# Patient Record
Sex: Male | Born: 1955
Health system: Southern US, Community
[De-identification: ages and names within clinical notes are randomized; demographics above are authoritative.]

## PROBLEM LIST (undated history)

## (undated) DIAGNOSIS — J45909 Unspecified asthma, uncomplicated: Secondary | ICD-10-CM

## (undated) DIAGNOSIS — I82409 Acute embolism and thrombosis of unspecified deep veins of unspecified lower extremity: Secondary | ICD-10-CM

## (undated) DIAGNOSIS — I2510 Atherosclerotic heart disease of native coronary artery without angina pectoris: Secondary | ICD-10-CM

## (undated) DIAGNOSIS — M109 Gout, unspecified: Secondary | ICD-10-CM

## (undated) DIAGNOSIS — M5136 Other intervertebral disc degeneration, lumbar region: Secondary | ICD-10-CM

## (undated) DIAGNOSIS — M792 Neuralgia and neuritis, unspecified: Secondary | ICD-10-CM

## (undated) DIAGNOSIS — E785 Hyperlipidemia, unspecified: Secondary | ICD-10-CM

## (undated) DIAGNOSIS — D72829 Elevated white blood cell count, unspecified: Secondary | ICD-10-CM

## (undated) DIAGNOSIS — J449 Chronic obstructive pulmonary disease, unspecified: Secondary | ICD-10-CM

## (undated) DIAGNOSIS — M51369 Other intervertebral disc degeneration, lumbar region without mention of lumbar back pain or lower extremity pain: Secondary | ICD-10-CM

## (undated) DIAGNOSIS — I2699 Other pulmonary embolism without acute cor pulmonale: Secondary | ICD-10-CM

## (undated) DIAGNOSIS — M79673 Pain in unspecified foot: Secondary | ICD-10-CM

## (undated) DIAGNOSIS — I10 Essential (primary) hypertension: Secondary | ICD-10-CM

## (undated) DIAGNOSIS — M549 Dorsalgia, unspecified: Secondary | ICD-10-CM

## (undated) DIAGNOSIS — E7800 Pure hypercholesterolemia, unspecified: Secondary | ICD-10-CM

## (undated) DIAGNOSIS — Z86718 Personal history of other venous thrombosis and embolism: Secondary | ICD-10-CM

## (undated) DIAGNOSIS — G629 Polyneuropathy, unspecified: Secondary | ICD-10-CM

## (undated) DIAGNOSIS — G8929 Other chronic pain: Secondary | ICD-10-CM

## (undated) HISTORY — DX: Hyperlipidemia, unspecified: E78.5

## (undated) HISTORY — PX: HERNIA REPAIR: SHX51

## (undated) HISTORY — DX: Other pulmonary embolism without acute cor pulmonale: I26.99

## (undated) HISTORY — DX: Other intervertebral disc degeneration, lumbar region without mention of lumbar back pain or lower extremity pain: M51.369

## (undated) HISTORY — DX: Personal history of other venous thrombosis and embolism: Z86.718

## (undated) HISTORY — DX: Other intervertebral disc degeneration, lumbar region: M51.36

## (undated) HISTORY — DX: Atherosclerotic heart disease of native coronary artery without angina pectoris: I25.10

## (undated) HISTORY — DX: Essential (primary) hypertension: I10

## (undated) HISTORY — PX: OTHER SURGICAL HISTORY: SHX169

## (undated) HISTORY — DX: Polyneuropathy, unspecified: G62.9

## (undated) HISTORY — PX: KNEE ARTHROSCOPY: SUR90

---

## 2004-03-23 ENCOUNTER — Ambulatory Visit: Payer: Self-pay | Admitting: Occupational Therapy

## 2007-04-22 ENCOUNTER — Emergency Department (HOSPITAL_COMMUNITY): Admission: EM | Admit: 2007-04-22 | Discharge: 2007-04-22 | Payer: Self-pay | Admitting: Emergency Medicine

## 2009-06-10 ENCOUNTER — Emergency Department (HOSPITAL_COMMUNITY): Admission: EM | Admit: 2009-06-10 | Discharge: 2009-06-10 | Payer: Self-pay | Admitting: Emergency Medicine

## 2010-12-24 ENCOUNTER — Ambulatory Visit: Payer: BC Managed Care – PPO | Attending: Nurse Practitioner | Admitting: Sleep Medicine

## 2010-12-24 DIAGNOSIS — G4733 Obstructive sleep apnea (adult) (pediatric): Secondary | ICD-10-CM | POA: Insufficient documentation

## 2010-12-24 DIAGNOSIS — I10 Essential (primary) hypertension: Secondary | ICD-10-CM

## 2011-01-02 NOTE — Procedures (Signed)
NAME:  Antonio Baker, Antonio Baker                  ACCOUNT NO.:  [id number]  MEDICAL RECORD NO.:  [id number]          PATIENT TYPE:  OUT  LOCATION:  SLEEP LAB                     FACILITY:  [facility]  PHYSICIAN:  Laurianne Floresca A. Gerilyn Pilgrim, M.D. DATE OF BIRTH:  1955-10-05  DATE OF STUDY:  12/24/2010                           NOCTURNAL POLYSOMNOGRAM  REFERRING PHYSICIAN:  Olegario Messier PATTERSON  REFERRING PHYSICIAN:  Ninfa Linden.  INDICATIONS:  A 55 year old presents with restless sleep, snoring, and fatigue.  The study has been done to evaluate for obstructive sleep apnea syndrome.  MEDICATIONS:  Amlodipine, losartan, simvastatin, and Uloric acid.  EPWORTH SLEEPINESS SCALE:  2.  BMI 28.  ARCHITECTURAL SUMMARY:  The total recording time is 423 minutes.  Sleep efficiency 60%, sleep latency 26 minutes.  REM latency 36 minutes. Stage N1 20.6%, N2 57.1%, N3 0%, and REM sleep 22.2%.  RESPIRATORY SUMMARY:  Baseline oxygen saturation is 97, lowest saturation 88.  DIAGNOSTICS:  AHI 6 and RDI 8.8.  LIMB MOVEMENT SUMMARY:  PLM index is 63.  ELECTROCARDIOGRAM SUMMARY:  Average heart rate is 71 with no significant dysrhythmias observed.  IMPRESSION: 1. Severe periodic limb movement disorder sleep. 2. Abnormal sleep architecture with early REM latency typically seen     in narcolepsy or REM rebound phenomenon such as withdrawal from     stimulants or antidepressants. 3. Mild obstructive sleep apnea syndrome not requiring positive     pressure.  RECOMMENDATIONS: 1. Consider sleep consultation for further evaluation of the early REM     latency. 2. Treatment of periodic limb movement disorder of sleep with dopamine     agonist such as Mirapex or ReQuip.  Thanks for this consultation.     Aixa Corsello A. Gerilyn Pilgrim, M.D.    KAD/MEDQ  D:  01/02/2011 17:07:43  T:  01/02/2011 82:95:62  Job:  [id number]

## 2011-03-02 LAB — BASIC METABOLIC PANEL
BUN: 11
Calcium: 9.9
Chloride: 102
GFR calc Af Amer: >60
Potassium: 4.7

## 2012-05-04 ENCOUNTER — Emergency Department (HOSPITAL_COMMUNITY)
Admission: EM | Admit: 2012-05-04 | Discharge: 2012-05-04 | Disposition: A | Discharge status: Home / Self Care | Payer: BC Managed Care – PPO | Attending: Emergency Medicine | Admitting: Emergency Medicine

## 2012-05-04 ENCOUNTER — Encounter (HOSPITAL_COMMUNITY): Payer: Self-pay | Admitting: Emergency Medicine

## 2012-05-04 ENCOUNTER — Emergency Department (HOSPITAL_COMMUNITY): Payer: BC Managed Care – PPO

## 2012-05-04 DIAGNOSIS — I10 Essential (primary) hypertension: Secondary | ICD-10-CM | POA: Insufficient documentation

## 2012-05-04 DIAGNOSIS — Z79899 Other long term (current) drug therapy: Secondary | ICD-10-CM | POA: Insufficient documentation

## 2012-05-04 DIAGNOSIS — J45901 Unspecified asthma with (acute) exacerbation: Secondary | ICD-10-CM | POA: Insufficient documentation

## 2012-05-04 DIAGNOSIS — Z8739 Personal history of other diseases of the musculoskeletal system and connective tissue: Secondary | ICD-10-CM | POA: Insufficient documentation

## 2012-05-04 DIAGNOSIS — R6883 Chills (without fever): Secondary | ICD-10-CM | POA: Insufficient documentation

## 2012-05-04 DIAGNOSIS — Z87891 Personal history of nicotine dependence: Secondary | ICD-10-CM | POA: Insufficient documentation

## 2012-05-04 DIAGNOSIS — R0602 Shortness of breath: Secondary | ICD-10-CM | POA: Insufficient documentation

## 2012-05-04 DIAGNOSIS — E7800 Pure hypercholesterolemia, unspecified: Secondary | ICD-10-CM | POA: Insufficient documentation

## 2012-05-04 HISTORY — DX: Pure hypercholesterolemia, unspecified: E78.00

## 2012-05-04 HISTORY — DX: Essential (primary) hypertension: I10

## 2012-05-04 HISTORY — DX: Gout, unspecified: M10.9

## 2012-05-04 LAB — CBC
HCT: 40 % (ref 39.0–52.0)
MCHC: 32.8 g/dL (ref 30.0–36.0)
MCV: 92.4 fL (ref 78.0–100.0)
RBC: 4.33 MIL/uL (ref 4.22–5.81)

## 2012-05-04 LAB — BASIC METABOLIC PANEL
Chloride: 104 mEq/L (ref 96–112)
Creatinine, Ser: 0.75 mg/dL (ref 0.50–1.35)
GFR calc Af Amer: >90 mL/min (ref >90)
Sodium: 139 mEq/L (ref 135–145)

## 2012-05-04 MED ORDER — PREDNISONE 10 MG PO TABS: 60.0000 mg | ORAL_TABLET | Freq: Every day | ORAL | Status: DC

## 2012-05-04 MED ORDER — PREDNISONE 50 MG PO TABS
60.0000 mg | ORAL_TABLET | Freq: Once | ORAL | Status: AC
  Administered 2012-05-04: 60 mg via ORAL
  Filled 2012-05-04: qty 1

## 2012-05-04 MED ORDER — IPRATROPIUM BROMIDE 0.02 % IN SOLN
0.5000 mg | Freq: Once | RESPIRATORY_TRACT | Status: AC
  Administered 2012-05-04: 0.5 mg via RESPIRATORY_TRACT
  Filled 2012-05-04: qty 2.5

## 2012-05-04 MED ORDER — ALBUTEROL SULFATE (5 MG/ML) 0.5% IN NEBU
2.5000 mg | INHALATION_SOLUTION | Freq: Once | RESPIRATORY_TRACT | Status: AC
  Administered 2012-05-04: 2.5 mg via RESPIRATORY_TRACT
  Filled 2012-05-04: qty 0.5

## 2012-05-04 MED ORDER — ALBUTEROL SULFATE HFA 108 (90 BASE) MCG/ACT IN AERS
2.0000 | INHALATION_SPRAY | Freq: Once | RESPIRATORY_TRACT | Status: AC
  Administered 2012-05-04: 2 via RESPIRATORY_TRACT
  Filled 2012-05-04: qty 6.7

## 2012-05-04 MED ORDER — ALBUTEROL SULFATE HFA 108 (90 BASE) MCG/ACT IN AERS: 1.0000 | INHALATION_SPRAY | Freq: Four times a day (QID) | RESPIRATORY_TRACT | Status: DC | PRN

## 2012-05-04 MED ORDER — ALBUTEROL SULFATE (5 MG/ML) 0.5% IN NEBU
5.0000 mg | INHALATION_SOLUTION | Freq: Once | RESPIRATORY_TRACT | Status: AC
  Administered 2012-05-04: 5 mg via RESPIRATORY_TRACT
  Filled 2012-05-04: qty 0.5

## 2012-05-04 NOTE — ED Notes (Signed)
Patient c/o increased shortness of breath with cough and chills x 1 week.

## 2012-05-04 NOTE — ED Provider Notes (Signed)
History     CSN: [id number]  Arrival date & time 05/04/12  0201   First MD Initiated Contact with Patient 05/04/12 0207      Chief Complaint  Patient presents with  . Cough  . Shortness of Breath  . Chills    The history is provided by the patient.  patient reports cough and shortness of breath x1 week.  He reports this is worsened in the past 12-24 hours.  Chills without documented fever.  No hemoptysis.  No history of DVT or pulmonary motion.  No unilateral leg swelling.  No recent long travel or surgery.  The patient has a history of asthma but has not tried his albuterol at home because he states he is without one.  Nothing worsens or improves his symptoms.  Last week he had similar symptoms in the EMS came to his house name her breathing treatment which resolved his symptoms.  This time he felt bad and decided to come to the emergency department.  He has not discussed this with his primary care physician.  His symptoms are mild to moderate in severity.  He denies orthopnea or chest pain.  No history of coronary artery disease or congestive heart failure.  He used to smoke cigarettes but no longer smokes. No melena  Past Medical History  Diagnosis Date  . Hypertension   . Gout   . High cholesterol     History reviewed. No pertinent past surgical history.  No family history on file.  History  Substance Use Topics  . Smoking status: Former Games developer  . Smokeless tobacco: Not on file  . Alcohol Use: Yes      Review of Systems  Respiratory: Positive for cough and shortness of breath.   All other systems reviewed and are negative.    Allergies  Review of patient's allergies indicates no known allergies.  Home Medications   Current Outpatient Rx  Name  Route  Sig  Dispense  Refill  . HYDROCODONE-ACETAMINOPHEN 10-325 MG PO TABS   Oral   Take 1 tablet by mouth every 6 (six) hours as needed.         [name] VALSARTAN 160 MG PO TABS   Oral   Take 160 mg by mouth daily.        . ALBUTEROL SULFATE HFA 108 (90 BASE) MCG/ACT IN AERS   Inhalation   Inhale 1-2 puffs into the lungs every 6 (six) hours as needed for wheezing.   1 Inhaler   0   . PREDNISONE 10 MG PO TABS   Oral   Take 6 tablets (60 mg total) by mouth daily.   30 tablet   0     BP 188/105  Pulse 109  Temp 97.4 F (36.3 C) (Oral)  Resp 16  Ht 2\' 6"  (0.762 m)  Wt 220 lb (99.791 kg)  BMI 171.86 kg/m2  SpO2 99%  Physical Exam  Nursing note and vitals reviewed. Constitutional: He is oriented to person, place, and time. He appears well-developed and well-nourished.  HENT:  Head: Normocephalic and atraumatic.  Eyes: EOM are normal.  Neck: Normal range of motion.  Cardiovascular: Normal rate, regular rhythm, normal heart sounds and intact distal pulses.   Pulmonary/Chest: Effort normal. No respiratory distress. He has wheezes. He has no rales.  Abdominal: Soft. He exhibits no distension. There is no tenderness.  Musculoskeletal: Normal range of motion. He exhibits no edema and no tenderness.  Neurological: He is alert and oriented to person, place,  and time.  Skin: Skin is warm and dry.  Psychiatric: He has a normal mood and affect. Judgment normal.    ED Course  Procedures (including critical care time)  Labs Reviewed  CBC - Abnormal; Notable for the following:    WBC 13.5 (*)     All other components within normal limits  BASIC METABOLIC PANEL - Abnormal; Notable for the following:    Glucose, Bld 110 (*)     All other components within normal limits  TROPONIN I   Dg Chest 2 View  05/04/2012  *RADIOLOGY REPORT*  Clinical Data: Cough and congestion.  Shortness of breath.  Chills.  CHEST - 2 VIEW  Comparison: 04/22/2007  Findings: Normal heart size and pulmonary vascularity. Peribronchial thickening and central interstitial changes suggesting chronic bronchitis.  No focal airspace consolidation in the lungs.  No blunting of costophrenic angles.  No pneumothorax. Mediastinal  contours appear intact.  Calcification of the aorta. Old right rib fractures.  Nodular changes seen on previous study are not well appreciated today.  Degenerative changes in the thoracic spine.  IMPRESSION: Chronic bronchitic changes in the lungs.  No evidence of active pulmonary disease.   Original Report Authenticated By: Burman Nieves, M.D.    I personally reviewed the imaging tests through PACS system I reviewed available ER/hospitalization records through the EMR   1. Asthma exacerbation      Date: 05/04/2012  Rate: 103  Rhythm: normal sinus rhythm  QRS Axis: normal  Intervals: normal  ST/T Wave abnormalities: normal  Conduction Disutrbances: none  Narrative Interpretation:   Old EKG Reviewed: No significant changes noted     MDM  This appears to be an asthma exacerbation.  Labs and chest x-ray pending.  We'll treat the patient with prednisone and beta 2 agonists   4:03 AM The patient feels much better at this time.  Discharge home in good condition.  This appears to be an asthma exacerbation.  Home with albuterol and prednisone.  He understands to return to the ER for new worsening symptoms     Lyanne Co, MD 05/04/12 [phone]

## 2012-05-14 ENCOUNTER — Emergency Department (HOSPITAL_COMMUNITY): Payer: BC Managed Care – PPO

## 2012-05-14 ENCOUNTER — Inpatient Hospital Stay (HOSPITAL_COMMUNITY)
Admission: EM | Admit: 2012-05-14 | Discharge: 2012-05-17 | DRG: 089 | Disposition: A | Discharge status: Home / Self Care | Payer: BC Managed Care – PPO | Attending: Internal Medicine | Admitting: Internal Medicine

## 2012-05-14 ENCOUNTER — Encounter (HOSPITAL_COMMUNITY): Payer: Self-pay | Admitting: *Deleted

## 2012-05-14 DIAGNOSIS — E7800 Pure hypercholesterolemia, unspecified: Secondary | ICD-10-CM | POA: Diagnosis present

## 2012-05-14 DIAGNOSIS — R739 Hyperglycemia, unspecified: Secondary | ICD-10-CM | POA: Diagnosis not present

## 2012-05-14 DIAGNOSIS — J45901 Unspecified asthma with (acute) exacerbation: Secondary | ICD-10-CM

## 2012-05-14 DIAGNOSIS — D72829 Elevated white blood cell count, unspecified: Secondary | ICD-10-CM | POA: Diagnosis present

## 2012-05-14 DIAGNOSIS — R7309 Other abnormal glucose: Secondary | ICD-10-CM | POA: Diagnosis present

## 2012-05-14 DIAGNOSIS — E785 Hyperlipidemia, unspecified: Secondary | ICD-10-CM | POA: Diagnosis present

## 2012-05-14 DIAGNOSIS — J441 Chronic obstructive pulmonary disease with (acute) exacerbation: Secondary | ICD-10-CM | POA: Diagnosis present

## 2012-05-14 DIAGNOSIS — F411 Generalized anxiety disorder: Secondary | ICD-10-CM | POA: Diagnosis present

## 2012-05-14 DIAGNOSIS — Z87891 Personal history of nicotine dependence: Secondary | ICD-10-CM

## 2012-05-14 DIAGNOSIS — R000 Tachycardia, unspecified: Secondary | ICD-10-CM

## 2012-05-14 DIAGNOSIS — Z79899 Other long term (current) drug therapy: Secondary | ICD-10-CM

## 2012-05-14 DIAGNOSIS — T50905A Adverse effect of unspecified drugs, medicaments and biological substances, initial encounter: Secondary | ICD-10-CM

## 2012-05-14 DIAGNOSIS — I498 Other specified cardiac arrhythmias: Secondary | ICD-10-CM | POA: Diagnosis present

## 2012-05-14 DIAGNOSIS — T380X5A Adverse effect of glucocorticoids and synthetic analogues, initial encounter: Secondary | ICD-10-CM | POA: Diagnosis present

## 2012-05-14 DIAGNOSIS — M109 Gout, unspecified: Secondary | ICD-10-CM | POA: Diagnosis present

## 2012-05-14 DIAGNOSIS — I10 Essential (primary) hypertension: Secondary | ICD-10-CM | POA: Diagnosis present

## 2012-05-14 DIAGNOSIS — R51 Headache: Secondary | ICD-10-CM | POA: Diagnosis present

## 2012-05-14 DIAGNOSIS — J209 Acute bronchitis, unspecified: Secondary | ICD-10-CM | POA: Diagnosis present

## 2012-05-14 DIAGNOSIS — J189 Pneumonia, unspecified organism: Principal | ICD-10-CM

## 2012-05-14 DIAGNOSIS — J440 Chronic obstructive pulmonary disease with acute lower respiratory infection: Secondary | ICD-10-CM | POA: Diagnosis present

## 2012-05-14 DIAGNOSIS — Z23 Encounter for immunization: Secondary | ICD-10-CM

## 2012-05-14 DIAGNOSIS — J449 Chronic obstructive pulmonary disease, unspecified: Secondary | ICD-10-CM

## 2012-05-14 HISTORY — DX: Unspecified asthma, uncomplicated: J45.909

## 2012-05-14 MED ORDER — IPRATROPIUM BROMIDE 0.02 % IN SOLN
0.5000 mg | Freq: Once | RESPIRATORY_TRACT | Status: AC
  Administered 2012-05-14: 0.5 mg via RESPIRATORY_TRACT
  Filled 2012-05-14: qty 2.5

## 2012-05-14 MED ORDER — DEXTROSE 5 % IV SOLN
500.0000 mg | INTRAVENOUS | Status: DC
  Filled 2012-05-14: qty 500

## 2012-05-14 MED ORDER — ALBUTEROL SULFATE (5 MG/ML) 0.5% IN NEBU
10.0000 mg | INHALATION_SOLUTION | Freq: Once | RESPIRATORY_TRACT | Status: AC
  Administered 2012-05-14: 10 mg via RESPIRATORY_TRACT
  Filled 2012-05-14: qty 0.5

## 2012-05-14 MED ORDER — DEXTROSE 5 % IV SOLN
1.0000 g | Freq: Once | INTRAVENOUS | Status: AC
  Administered 2012-05-14: 1 g via INTRAVENOUS
  Filled 2012-05-14: qty 10

## 2012-05-14 MED ORDER — ALBUTEROL SULFATE (5 MG/ML) 0.5% IN NEBU
2.5000 mg | INHALATION_SOLUTION | Freq: Once | RESPIRATORY_TRACT | Status: AC
  Administered 2012-05-14: 2.5 mg via RESPIRATORY_TRACT
  Filled 2012-05-14: qty 0.5

## 2012-05-14 MED ORDER — METHYLPREDNISOLONE SODIUM SUCC 125 MG IJ SOLR
125.0000 mg | Freq: Once | INTRAMUSCULAR | Status: AC
  Administered 2012-05-14: 125 mg via INTRAVENOUS
  Filled 2012-05-14: qty 2

## 2012-05-14 NOTE — ED Notes (Signed)
Patient states when he starts coughing, his chest tightens and he feels like he is unable to breathe, states he feels very anxious.

## 2012-05-14 NOTE — ED Provider Notes (Signed)
History   This chart was scribed for Lyanne Co, MD by Leone Payor, ED Scribe. This patient was seen in room [facility] and the patient's care was started at 2305.   CSN: [id number]  Arrival date & time 05/14/12  2124   First MD Initiated Contact with Patient 05/14/12 2305      Chief Complaint  Patient presents with  . Shortness of Breath    The history is provided by the patient. No language interpreter was used.    Antonio Baker is a 56 y.o. male with h/o asthma who presents to the Emergency Department complaining of recurring, constant, unchanged, mild to moderate SOB starting 3 days ago. Pt was seen here about 1 wk ago for exacerbated asthma symptoms. Pt reports using an albuterol inhaler at home with mild relief. Pt has already received 1 breathing treatment since coming to the ED. Nothing worsens or improves his symptoms. Pt has associated cough, chest tightness. He denies any fever, sore throat, headaches. Pt does not have h/o coronary artery disease or congestive heart failure.   Pt has h/o HTN, gout, and high cholesterol.  Pt is a former smoker but denies alcohol use. Past Medical History  Diagnosis Date  . Hypertension   . Gout   . High cholesterol     History reviewed. No pertinent past surgical history.  History reviewed. No pertinent family history.  History  Substance Use Topics  . Smoking status: Former Games developer  . Smokeless tobacco: Not on file  . Alcohol Use: Yes      Review of Systems A complete 10 system review of systems was obtained and all systems are negative except as noted in the HPI and PMH.    Allergies  Review of patient's allergies indicates no known allergies.  Home Medications   Current Outpatient Rx  Name  Route  Sig  Dispense  Refill  . ALBUTEROL SULFATE HFA 108 (90 BASE) MCG/ACT IN AERS   Inhalation   Inhale 1-2 puffs into the lungs every 6 (six) hours as needed for wheezing.   1 Inhaler   0   . ALLOPURINOL 300 MG PO  TABS   Oral   Take 300 mg by mouth daily.         [name] HYDROCODONE-ACETAMINOPHEN 10-325 MG PO TABS   Oral   Take 1 tablet by mouth every 6 (six) hours as needed. pain         . PRESCRIPTION MEDICATION   Oral   Take 1 tablet by mouth 2 (two) times daily.         [name] SIMVASTATIN 40 MG PO TABS   Oral   Take 40 mg by mouth every evening.         [name] VALSARTAN 160 MG PO TABS   Oral   Take 160 mg by mouth daily.           BP 174/96  Pulse 120  Temp 97.8 F (36.6 C) (Oral)  Resp 24  Ht 6\' 2"  (1.88 m)  Wt 220 lb (99.791 kg)  BMI 28.25 kg/m2  SpO2 96%  Physical Exam  Nursing note and vitals reviewed. Constitutional: He is oriented to person, place, and time. He appears well-developed and well-nourished.  HENT:  Head: Normocephalic and atraumatic.  Eyes: EOM are normal.  Neck: Normal range of motion.  Cardiovascular: Normal rate, regular rhythm, normal heart sounds and intact distal pulses.   Pulmonary/Chest: Effort normal. No respiratory distress. He has wheezes.  Bilateral rhonchi and wheezes.   Abdominal: Soft. He exhibits no distension. There is no tenderness.  Musculoskeletal: Normal range of motion.  Neurological: He is alert and oriented to person, place, and time.  Skin: Skin is warm and dry.  Psychiatric: He has a normal mood and affect. Judgment normal.    ED Course  Procedures (including critical care time)  DIAGNOSTIC STUDIES: Oxygen Saturation is 96% on room air, adequate by my interpretation.    COORDINATION OF CARE:  11:06 PM Discussed treatment plan which includes lab work and imaging with pt at bedside and pt agreed to plan.    Labs Reviewed - No data to display Dg Chest 2 View  05/14/2012  *RADIOLOGY REPORT*  Clinical Data: Worsening shortness of breath, cough and wheezing.  CHEST - 2 VIEW  Comparison: Chest radiograph performed 05/03/2012, and CT of the chest performed 04/22/2007  Findings: The lungs are well-aerated.  Mild bibasilar  airspace opacities appear worsened from the prior study, and could reflect mild pneumonia.  Underlying chronic peribronchial thickening is noted.  There is no evidence of pleural effusion or pneumothorax.  The heart is normal in size; the mediastinal contour is within normal limits.  No acute osseous abnormalities are seen.  IMPRESSION:  1.  Worsening mild bibasilar airspace opacities could reflect mild pneumonia. 2.  Underlying chronic peribronchial thickening noted.   Original Report Authenticated By: Tonia Ghent, M.D.    I personally reviewed the imaging tests through PACS system I reviewed available ER/hospitalization records through the EMR   1. CAP (community acquired pneumonia)   2. COPD (chronic obstructive pulmonary disease)       MDM  Patient be admitted for community-acquired pneumonia and COPD exacerbation.  He is receiving at continuous albuterol nebulized treatment in the emergency department.  I spoke with the hospitalist Dr. Phillips Odor who agrees to admit the patient to      I personally performed the services described in this documentation, which was scribed in my presence. The recorded information has been reviewed and is accurate.      Lyanne Co, MD 05/15/12 [phone]

## 2012-05-14 NOTE — ED Notes (Signed)
Pt c/o sob x 1 wk. Pt audibly wheezing

## 2012-05-15 ENCOUNTER — Encounter (HOSPITAL_COMMUNITY): Payer: Self-pay

## 2012-05-15 DIAGNOSIS — J45901 Unspecified asthma with (acute) exacerbation: Secondary | ICD-10-CM | POA: Diagnosis present

## 2012-05-15 DIAGNOSIS — J189 Pneumonia, unspecified organism: Secondary | ICD-10-CM | POA: Diagnosis present

## 2012-05-15 DIAGNOSIS — I10 Essential (primary) hypertension: Secondary | ICD-10-CM | POA: Diagnosis present

## 2012-05-15 LAB — CBC WITH DIFFERENTIAL/PLATELET
Basophils Absolute: 0 10*3/uL (ref 0.0–0.1)
Eosinophils Relative: 6 % — ABNORMAL HIGH (ref 0–5)
HCT: 41.7 % (ref 39.0–52.0)
Lymphocytes Relative: 17 % (ref 12–46)
Lymphs Abs: 3.5 10*3/uL (ref 0.7–4.0)
MCV: 93.1 fL (ref 78.0–100.0)
Monocytes Relative: 9 % (ref 3–12)
Platelets: 324 10*3/uL (ref 150–400)
RBC: 4.48 MIL/uL (ref 4.22–5.81)
RDW: 13.2 % (ref 11.5–15.5)
WBC: 20.5 10*3/uL — ABNORMAL HIGH (ref 4.0–10.5)

## 2012-05-15 LAB — HIV ANTIBODY (ROUTINE TESTING W REFLEX): HIV: NONREACTIVE

## 2012-05-15 LAB — BASIC METABOLIC PANEL
CO2: 25 mEq/L (ref 19–32)
Calcium: 10.4 mg/dL (ref 8.4–10.5)
Chloride: 101 mEq/L (ref 96–112)
Glucose, Bld: 115 mg/dL — ABNORMAL HIGH (ref 70–99)
Sodium: 136 mEq/L (ref 135–145)

## 2012-05-15 LAB — PRO B NATRIURETIC PEPTIDE: Pro B Natriuretic peptide (BNP): 8.9 pg/mL (ref 0–125)

## 2012-05-15 LAB — GLUCOSE, CAPILLARY: Glucose-Capillary: 165 mg/dL — ABNORMAL HIGH (ref 70–99)

## 2012-05-15 LAB — TROPONIN I: Troponin I: <0.3 ng/mL (ref <0.30)

## 2012-05-15 LAB — INFLUENZA PANEL BY PCR (TYPE A & B): H1N1 flu by pcr: NOT DETECTED

## 2012-05-15 MED ORDER — CARVEDILOL 12.5 MG PO TABS: 25.0000 mg | ORAL_TABLET | Freq: Two times a day (BID) | ORAL | Status: DC

## 2012-05-15 MED ORDER — HYDROCODONE-ACETAMINOPHEN 5-325 MG PO TABS: 1.0000 | ORAL_TABLET | ORAL | Status: DC | PRN

## 2012-05-15 MED ORDER — ONDANSETRON HCL 4 MG/2ML IJ SOLN: 4.0000 mg | Freq: Four times a day (QID) | INTRAMUSCULAR | Status: DC | PRN

## 2012-05-15 MED ORDER — GUAIFENESIN-DM 100-10 MG/5ML PO SYRP
5.0000 mL | ORAL_SOLUTION | ORAL | Status: DC | PRN
  Administered 2012-05-15 – 2012-05-17 (×2): 5 mL via ORAL
  Filled 2012-05-15 (×2): qty 5

## 2012-05-15 MED ORDER — ALUM & MAG HYDROXIDE-SIMETH 200-200-20 MG/5ML PO SUSP: 30.0000 mL | Freq: Four times a day (QID) | ORAL | Status: DC | PRN

## 2012-05-15 MED ORDER — ALBUTEROL SULFATE (5 MG/ML) 0.5% IN NEBU: 5.0000 mg | INHALATION_SOLUTION | RESPIRATORY_TRACT | Status: DC | PRN

## 2012-05-15 MED ORDER — ONDANSETRON HCL 4 MG PO TABS: 4.0000 mg | ORAL_TABLET | Freq: Four times a day (QID) | ORAL | Status: DC | PRN

## 2012-05-15 MED ORDER — IPRATROPIUM BROMIDE 0.02 % IN SOLN
0.5000 mg | RESPIRATORY_TRACT | Status: DC
  Administered 2012-05-15 – 2012-05-16 (×6): 0.5 mg via RESPIRATORY_TRACT
  Filled 2012-05-15 (×7): qty 2.5

## 2012-05-15 MED ORDER — SIMVASTATIN 20 MG PO TABS
40.0000 mg | ORAL_TABLET | Freq: Every evening | ORAL | Status: DC
  Administered 2012-05-15 – 2012-05-16 (×2): 40 mg via ORAL
  Filled 2012-05-15 (×2): qty 2

## 2012-05-15 MED ORDER — ALBUTEROL SULFATE (5 MG/ML) 0.5% IN NEBU
2.5000 mg | INHALATION_SOLUTION | RESPIRATORY_TRACT | Status: DC
  Administered 2012-05-15 (×4): 2.5 mg via RESPIRATORY_TRACT
  Filled 2012-05-15 (×5): qty 0.5

## 2012-05-15 MED ORDER — ALBUTEROL SULFATE (5 MG/ML) 0.5% IN NEBU
2.5000 mg | INHALATION_SOLUTION | RESPIRATORY_TRACT | Status: DC | PRN
  Administered 2012-05-15: 2.5 mg via RESPIRATORY_TRACT
  Filled 2012-05-15: qty 0.5

## 2012-05-15 MED ORDER — DEXTROSE 5 % IV SOLN
1.0000 g | INTRAVENOUS | Status: DC
  Administered 2012-05-15 – 2012-05-16 (×2): 1 g via INTRAVENOUS
  Filled 2012-05-15 (×3): qty 10

## 2012-05-15 MED ORDER — INSULIN ASPART 100 UNIT/ML ~~LOC~~ SOLN: 0.0000 [IU] | Freq: Every day | SUBCUTANEOUS | Status: DC

## 2012-05-15 MED ORDER — INSULIN GLARGINE 100 UNIT/ML ~~LOC~~ SOLN
12.0000 [IU] | Freq: Two times a day (BID) | SUBCUTANEOUS | Status: DC
  Administered 2012-05-15 – 2012-05-16 (×3): 12 [IU] via SUBCUTANEOUS

## 2012-05-15 MED ORDER — INSULIN ASPART 100 UNIT/ML ~~LOC~~ SOLN
0.0000 [IU] | Freq: Three times a day (TID) | SUBCUTANEOUS | Status: DC
  Administered 2012-05-15: 4 [IU] via SUBCUTANEOUS
  Administered 2012-05-15 – 2012-05-16 (×2): 3 [IU] via SUBCUTANEOUS

## 2012-05-15 MED ORDER — ACETAMINOPHEN 650 MG RE SUPP: 650.0000 mg | Freq: Four times a day (QID) | RECTAL | Status: DC | PRN

## 2012-05-15 MED ORDER — ACETAMINOPHEN 325 MG PO TABS: 650.0000 mg | ORAL_TABLET | Freq: Four times a day (QID) | ORAL | Status: DC | PRN

## 2012-05-15 MED ORDER — IPRATROPIUM BROMIDE 0.02 % IN SOLN
0.5000 mg | Freq: Four times a day (QID) | RESPIRATORY_TRACT | Status: DC
  Administered 2012-05-15: 0.5 mg via RESPIRATORY_TRACT
  Filled 2012-05-15: qty 2.5

## 2012-05-15 MED ORDER — SENNOSIDES-DOCUSATE SODIUM 8.6-50 MG PO TABS: 1.0000 | ORAL_TABLET | Freq: Every evening | ORAL | Status: DC | PRN

## 2012-05-15 MED ORDER — CARVEDILOL 12.5 MG PO TABS
25.0000 mg | ORAL_TABLET | Freq: Two times a day (BID) | ORAL | Status: DC
  Administered 2012-05-15 – 2012-05-17 (×4): 25 mg via ORAL
  Filled 2012-05-15 (×4): qty 2

## 2012-05-15 MED ORDER — METHYLPREDNISOLONE SODIUM SUCC 125 MG IJ SOLR: 60.0000 mg | Freq: Three times a day (TID) | INTRAMUSCULAR | Status: DC

## 2012-05-15 MED ORDER — ALBUTEROL SULFATE (5 MG/ML) 0.5% IN NEBU
5.0000 mg | INHALATION_SOLUTION | Freq: Once | RESPIRATORY_TRACT | Status: AC
  Administered 2012-05-15: 5 mg via RESPIRATORY_TRACT
  Filled 2012-05-15: qty 1

## 2012-05-15 MED ORDER — BENZONATATE 100 MG PO CAPS
100.0000 mg | ORAL_CAPSULE | Freq: Three times a day (TID) | ORAL | Status: DC
  Administered 2012-05-15 – 2012-05-17 (×7): 100 mg via ORAL
  Filled 2012-05-15 (×7): qty 1

## 2012-05-15 MED ORDER — METHYLPREDNISOLONE SODIUM SUCC 125 MG IJ SOLR
60.0000 mg | Freq: Four times a day (QID) | INTRAMUSCULAR | Status: DC
  Administered 2012-05-15 – 2012-05-16 (×4): 60 mg via INTRAVENOUS
  Filled 2012-05-15 (×4): qty 2

## 2012-05-15 MED ORDER — ALLOPURINOL 300 MG PO TABS
300.0000 mg | ORAL_TABLET | Freq: Every day | ORAL | Status: DC
  Administered 2012-05-15 – 2012-05-17 (×3): 300 mg via ORAL
  Filled 2012-05-15 (×3): qty 1

## 2012-05-15 MED ORDER — HYDROCOD POLST-CHLORPHEN POLST 10-8 MG/5ML PO LQCR
5.0000 mL | Freq: Two times a day (BID) | ORAL | Status: DC
  Administered 2012-05-15 – 2012-05-17 (×5): 5 mL via ORAL
  Filled 2012-05-15 (×5): qty 5

## 2012-05-15 MED ORDER — IRBESARTAN 150 MG PO TABS
150.0000 mg | ORAL_TABLET | Freq: Every day | ORAL | Status: DC
  Administered 2012-05-15 – 2012-05-17 (×3): 150 mg via ORAL
  Filled 2012-05-15 (×3): qty 1

## 2012-05-15 MED ORDER — ALBUTEROL SULFATE (5 MG/ML) 0.5% IN NEBU
2.5000 mg | INHALATION_SOLUTION | Freq: Four times a day (QID) | RESPIRATORY_TRACT | Status: DC
  Filled 2012-05-15: qty 0.5

## 2012-05-15 MED ORDER — ZOLPIDEM TARTRATE 5 MG PO TABS
5.0000 mg | ORAL_TABLET | Freq: Every evening | ORAL | Status: DC | PRN
  Administered 2012-05-15: 5 mg via ORAL
  Filled 2012-05-15: qty 1

## 2012-05-15 MED ORDER — ALPRAZOLAM 0.5 MG PO TABS
0.5000 mg | ORAL_TABLET | Freq: Four times a day (QID) | ORAL | Status: DC | PRN
  Administered 2012-05-15 – 2012-05-16 (×2): 0.5 mg via ORAL
  Filled 2012-05-15 (×2): qty 1

## 2012-05-15 MED ORDER — DEXTROSE 5 % IV SOLN
500.0000 mg | INTRAVENOUS | Status: DC
  Administered 2012-05-15 – 2012-05-16 (×2): 500 mg via INTRAVENOUS
  Filled 2012-05-15 (×3): qty 500

## 2012-05-15 MED ORDER — OSELTAMIVIR PHOSPHATE 75 MG PO CAPS
75.0000 mg | ORAL_CAPSULE | Freq: Once | ORAL | Status: AC
  Administered 2012-05-15: 75 mg via ORAL
  Filled 2012-05-15: qty 1

## 2012-05-15 NOTE — ED Notes (Signed)
Pt states he is feeling somewhat better, able to stand up at bedside and use urinal without difficulty.  Vitals WDL.

## 2012-05-15 NOTE — Progress Notes (Signed)
Subjective: The patient complains of a rattly cough with minimal expectoration. He continues to have chest congestion and wheezing. No pleurisy or chest pain.  Objective: Vital signs in last 24 hours: Filed Vitals:   05/15/12 0330 05/15/12 0458 05/15/12 0633 05/15/12 1132  BP: 144/92 148/91 158/86   Pulse: 99 106 111   Temp:  98.1 F (36.7 C) 97.9 F (36.6 C)   TempSrc:  Oral    Resp:  20 20   Height:  6\' 2"  (1.88 m)    Weight:  98.1 kg (216 lb 4.3 oz)    SpO2: 94% 95% 96% 93%   No intake or output data in the 24 hours ending 05/15/12 1136  Weight change:   Physical exam: General: 56 year old Caucasian man laying in bed, in no acute distress, although he appears ill. Lungs: Mild diffuse expiratory wheezes. Several nonproductive coughs. Heart: S1, S2, with borderline tachycardia. Abdomen: Positive bowel sounds, soft, nontender, nondistended. Extremities: No pedal edema.  Lab Results: Basic Metabolic Panel:  Basename 05/14/12 2328  NA 136  K 4.6  CL 101  CO2 25  GLUCOSE 115*  BUN 19  CREATININE 0.74  CALCIUM 10.4  MG --  PHOS --   Liver Function Tests: No results found for this basename: AST:2,ALT:2,ALKPHOS:2,BILITOT:2,PROT:2,ALBUMIN:2 in the last 72 hours No results found for this basename: LIPASE:2,AMYLASE:2 in the last 72 hours No results found for this basename: AMMONIA:2 in the last 72 hours CBC:  Basename 05/14/12 2328  WBC 20.5*  NEUTROABS 14.0*  HGB 14.0  HCT 41.7  MCV 93.1  PLT 324   Cardiac Enzymes:  Basename 05/14/12 2328  CKTOTAL --  CKMB --  CKMBINDEX --  TROPONINI <0.30   BNP:  Basename 05/15/12 0500  PROBNP 8.9   D-Dimer: No results found for this basename: DDIMER:2 in the last 72 hours CBG: No results found for this basename: GLUCAP:6 in the last 72 hours Hemoglobin A1C: No results found for this basename: HGBA1C in the last 72 hours Fasting Lipid Panel: No results found for this basename:  CHOL,HDL,LDLCALC,TRIG,CHOLHDL,LDLDIRECT in the last 72 hours Thyroid Function Tests: No results found for this basename: TSH,T4TOTAL,FREET4,T3FREE,THYROIDAB in the last 72 hours Anemia Panel: No results found for this basename: VITAMINB12,FOLATE,FERRITIN,TIBC,IRON,RETICCTPCT in the last 72 hours Coagulation: No results found for this basename: LABPROT:2,INR:2 in the last 72 hours Urine Drug Screen: Drugs of Abuse  No results found for this basename: labopia,  cocainscrnur,  labbenz,  amphetmu,  thcu,  labbarb    Alcohol Level: No results found for this basename: ETH:2 in the last 72 hours Urinalysis: No results found for this basename: COLORURINE:2,APPERANCEUR:2,LABSPEC:2,PHURINE:2,GLUCOSEU:2,HGBUR:2,BILIRUBINUR:2,KETONESUR:2,PROTEINUR:2,UROBILINOGEN:2,NITRITE:2,LEUKOCYTESUR:2 in the last 72 hours Misc. Labs:   Micro: Recent Results (from the past 240 hour(s))  CULTURE, BLOOD (ROUTINE X 2)     Status: Normal (Preliminary result)   Collection Time   05/14/12 11:28 PM      Component Value Range Status Comment   Specimen Description Blood RIGHT ARM   Final    Special Requests     Final    Value: BOTTLES DRAWN AEROBIC AND ANAEROBIC 6 CC IN ANA AND 8 CC IN AEB   Culture PENDING   Incomplete    Report Status PENDING   Incomplete   CULTURE, BLOOD (ROUTINE X 2)     Status: Normal (Preliminary result)   Collection Time   05/14/12 11:45 PM      Component Value Range Status Comment   Specimen Description Blood RIGHT HAND   Final  Special Requests     Final    Value: BOTTLES DRAWN AEROBIC AND ANAEROBIC 5 CC IN ANA AND 6 CC IN AEB   Culture PENDING   Incomplete    Report Status PENDING   Incomplete     Studies/Results: Dg Chest 2 View  05/14/2012  *RADIOLOGY REPORT*  Clinical Data: Worsening shortness of breath, cough and wheezing.  CHEST - 2 VIEW  Comparison: Chest radiograph performed 05/03/2012, and CT of the chest performed 04/22/2007  Findings: The lungs are well-aerated.  Mild  bibasilar airspace opacities appear worsened from the prior study, and could reflect mild pneumonia.  Underlying chronic peribronchial thickening is noted.  There is no evidence of pleural effusion or pneumothorax.  The heart is normal in size; the mediastinal contour is within normal limits.  No acute osseous abnormalities are seen.  IMPRESSION:  1.  Worsening mild bibasilar airspace opacities could reflect mild pneumonia. 2.  Underlying chronic peribronchial thickening noted.   Original Report Authenticated By: Tonia Ghent, M.D.     Medications:  Scheduled:   . albuterol  2.5 mg Nebulization Q4H  . allopurinol  300 mg Oral Daily  . azithromycin  500 mg Intravenous Q24H  . benzonatate  100 mg Oral TID  . cefTRIAXone (ROCEPHIN)  IV  1 g Intravenous Q24H  . chlorpheniramine-HYDROcodone  5 mL Oral Q12H  . insulin aspart  0-20 Units Subcutaneous TID WC  . insulin aspart  0-5 Units Subcutaneous QHS  . insulin glargine  12 Units Subcutaneous BID  . ipratropium  0.5 mg Nebulization Q4H  . irbesartan  150 mg Oral Daily  . methylPREDNISolone (SOLU-MEDROL) injection  60 mg Intravenous Q6H  . simvastatin  40 mg Oral QPM   Continuous:  ZOX:[id number], acetaminophen, albuterol, albuterol, ALPRAZolam, alum & mag hydroxide-simeth, guaiFENesin-dextromethorphan, HYDROcodone-acetaminophen, ondansetron (ZOFRAN) IV, ondansetron, senna-docusate, zolpidem  Assessment: Active Problems:  Asthma with exacerbation  CAP (community acquired pneumonia)  Hypertension   1. Community acquired pneumonia superimposed on asthma with exacerbation; query acute bronchitis as well. Influenza panel negative. We'll continue antibiotic treatment with azithromycin and Rocephin. Continue albuterol and Atrovent nebulizers. I expanded his steroid treatment with Solu-Medrol to every 6 hours. Tussionex and Tessalon Perles were added for his persistent cough.  Hypertension. Currently stable on ARB. I expect his blood pressure  to increase him with IV fluid hydration and steroids. We'll be monitored for changes in medication therapy.    Plan:  1. As above. 2. We'll add sliding scale NovoLog for anticipated steroid-induced hyperglycemia. 3. We'll check the results of the Legionella antigen and strep pneumo antigen pending. 4. Continue supportive treatment.   LOS: 1 day   Antonio Baker 05/15/2012, 11:36 AM

## 2012-05-15 NOTE — ED Notes (Signed)
Patient states he is feeling some better, continues to be anxious at times.

## 2012-05-15 NOTE — H&P (Signed)
[facility] History and Physical  Antonio Baker:[id number] DOB: 1955/09/22 DOA: 05/14/2012  Referring physician: Jordan Likes PCP: No primary provider on file.  Specialists: none  Chief Complaint: Dyspnea  HPI: Antonio Baker is a 56 y.o. male past medical history significant for hypertension hyperlipidemia and gout who presented to the emergency department complaining of worsening shortness of breath over the past one to 2 days. He did not report a history of chronic obstructive pulmonary disease but states he may have asthma although not formally diagnosed and he has a remote smoking history. On arrival into the emergency department he was acutely dyspneic with diffuse wheezing but responded well to inhaled continuous bronchodilator. A chest x-ray showed a probable pneumonia, and he had an elevated white count, no fever but complains of body aches and headache. Chest x-ray also shows some questionable chronic bronchiectatic changes which are not fully characterized on the x-ray. He has no cardiac history no chest pain or syncope. He has had some mild lower extremity swelling recently, no known history of sleep apnea. Admission requested for treatment of asthma/COPD exacerbation with secondary pneumonia.  Review of Systems: Review of Systems  Constitutional: Positive for malaise/fatigue. Negative for fever, chills, weight loss and diaphoresis.  HENT: Positive for congestion. Negative for sore throat.   Eyes: Negative.   Respiratory: Positive for cough, sputum production, shortness of breath and wheezing. Negative for stridor.   Cardiovascular: Positive for leg swelling.  Gastrointestinal: Positive for heartburn. Negative for nausea, vomiting, abdominal pain, diarrhea, constipation, blood in stool and melena.  Genitourinary: Negative.   Musculoskeletal: Positive for myalgias.  Skin: Negative for itching and rash.  Neurological: Positive for weakness and headaches. Negative for  dizziness, sensory change and focal weakness.  Endo/Heme/Allergies: Negative.   Psychiatric/Behavioral: Negative for depression, hallucinations, memory loss and substance abuse. The patient is nervous/anxious and has insomnia.   All other systems reviewed and are negative.     Past Medical History  Diagnosis Date  . Hypertension   . Gout   . High cholesterol    History reviewed. No pertinent past surgical history. Social History:  reports that he quit smoking about 13 years ago. His smoking use included Cigarettes. He does not have any smokeless tobacco history on file. He reports that he drinks alcohol. He reports that he does not use illicit drugs. Lives independently at home No Known Allergies  History reviewed. No pertinent family history. no family history of heart disease or malignancy  Prior to Admission medications   Medication Sig Start Date End Date Taking? Authorizing Provider  albuterol (PROVENTIL HFA;VENTOLIN HFA) 108 (90 BASE) MCG/ACT inhaler Inhale 1-2 puffs into the lungs every 6 (six) hours as needed for wheezing. 05/04/12  Yes Lyanne Co, MD  allopurinol (ZYLOPRIM) 300 MG tablet Take 300 mg by mouth daily.   Yes Historical Provider, MD  HYDROcodone-acetaminophen (NORCO) 10-325 MG per tablet Take 1 tablet by mouth every 6 (six) hours as needed. pain   Yes Historical Provider, MD  PRESCRIPTION MEDICATION Take 1 tablet by mouth 2 (two) times daily.   Yes Historical Provider, MD  simvastatin (ZOCOR) 40 MG tablet Take 40 mg by mouth every evening.   Yes Historical Provider, MD  valsartan (DIOVAN) 160 MG tablet Take 160 mg by mouth daily.   Yes Historical Provider, MD   Physical Exam: Filed Vitals:   05/15/12 0300 05/15/12 0309 05/15/12 0330 05/15/12 0458  BP: 163/90  144/92 148/91  Pulse: 93  99 106  Temp:    98.1 F (36.7 C)  TempSrc:    Oral  Resp:    20  Height:    6\' 2"  (1.88 m)  Weight:    98.1 kg (216 lb 4.3 oz)  SpO2: 94% 95% 94% 95%     General:   Mildly ill-appearing gentleman in minor distress, is able to speak in full sentences but looks very uncomfortable, cough and bronchospasm evident  Eyes: Normal  ENT: Normal  Neck: No JVD, supple  Cardiovascular: Tachycardic but regular no murmurs rubs or gallops  Respiratory: She is wheezing, overall poor air movement-lungs sound tight  Abdomen: Soft nontender active bowel sounds  Skin: No rashes or lesions  Musculoskeletal: Moves all 4 extremities  Psychiatric: Appropriate  Neurologic: Nonfocal  Labs on Admission:  Basic Metabolic Panel:  Lab 05/14/12 9147  NA 136  K 4.6  CL 101  CO2 25  GLUCOSE 115*  BUN 19  CREATININE 0.74  CALCIUM 10.4  MG --  PHOS --   L Lab 05/14/12 2328  WBC 20.5*  NEUTROABS 14.0*  HGB 14.0  HCT 41.7  MCV 93.1  PLT 324   Cardiac Enzymes:  Lab 05/14/12 2328  CKTOTAL --  CKMB --  CKMBINDEX --  TROPONINI <0.30    Radiological Exams on Admission: Dg Chest 2 View  05/14/2012  *RADIOLOGY REPORT*  Clinical Data: Worsening shortness of breath, cough and wheezing.  CHEST - 2 VIEW  Comparison: Chest radiograph performed 05/03/2012, and CT of the chest performed 04/22/2007  Findings: The lungs are well-aerated.  Mild bibasilar airspace opacities appear worsened from the prior study, and could reflect mild pneumonia.  Underlying chronic peribronchial thickening is noted.  There is no evidence of pleural effusion or pneumothorax.  The heart is normal in size; the mediastinal contour is within normal limits.  No acute osseous abnormalities are seen.  IMPRESSION:  1.  Worsening mild bibasilar airspace opacities could reflect mild pneumonia. 2.  Underlying chronic peribronchial thickening noted.   Original Report Authenticated By: Tonia Ghent, M.D.     EKG: pending at time of admission  Assessment/Plan Active Problems:  Asthma with exacerbation  CAP (community acquired pneumonia)  This 56 year old gentleman has likely chronic lung disease  versus asthma with an acute exacerbation characterized by wheezing which responded to bronchodilators, a chest x-ray showed a developing pneumonia which is supported by an elevated white count and systemic illness.  Plan: Admitted under observation for continuous scheduled and prn nebulizers, IV steroid, IV Rocephin and Azithromycin started for community acquired pneumonia, pneumonia core measures order set utilized. Supplemental oxygen if needed.  Code Status: Full code Family Communication: Plan of care discussed in detail with patient  Disposition Plan: Discharge home when medically stable, admitted under observation status anticipate improvement in next 24 hours Time spent: 50 minutes  [facility] Pager [phone]  If 7PM-7AM, please contact night-coverage [email or web address] Password [facility] 05/15/2012, 5:41 AM

## 2012-05-15 NOTE — Progress Notes (Signed)
UR Chart Review Completed  

## 2012-05-16 DIAGNOSIS — R000 Tachycardia, unspecified: Secondary | ICD-10-CM | POA: Diagnosis present

## 2012-05-16 DIAGNOSIS — T50905A Adverse effect of unspecified drugs, medicaments and biological substances, initial encounter: Secondary | ICD-10-CM | POA: Diagnosis not present

## 2012-05-16 DIAGNOSIS — R739 Hyperglycemia, unspecified: Secondary | ICD-10-CM | POA: Diagnosis not present

## 2012-05-16 LAB — GLUCOSE, CAPILLARY
Glucose-Capillary: 128 mg/dL — ABNORMAL HIGH (ref 70–99)
Glucose-Capillary: 120 mg/dL — ABNORMAL HIGH (ref 70–99)

## 2012-05-16 LAB — BASIC METABOLIC PANEL
BUN: 14 mg/dL (ref 6–23)
Calcium: 10.8 mg/dL — ABNORMAL HIGH (ref 8.4–10.5)
Creatinine, Ser: 0.55 mg/dL (ref 0.50–1.35)
GFR calc Af Amer: >90 mL/min (ref >90)
GFR calc non Af Amer: >90 mL/min (ref >90)

## 2012-05-16 LAB — EXPECTORATED SPUTUM ASSESSMENT W GRAM STAIN, RFLX TO RESP C

## 2012-05-16 LAB — CBC WITH DIFFERENTIAL/PLATELET
Basophils Relative: 0 % (ref 0–1)
Eosinophils Absolute: 0 10*3/uL (ref 0.0–0.7)
HCT: 39.6 % (ref 39.0–52.0)
Hemoglobin: 13.3 g/dL (ref 13.0–17.0)
MCH: 30.8 pg (ref 26.0–34.0)
MCHC: 33.6 g/dL (ref 30.0–36.0)
Monocytes Absolute: 0.7 10*3/uL (ref 0.1–1.0)
Monocytes Relative: 3 % (ref 3–12)
Neutrophils Relative %: 90 % — ABNORMAL HIGH (ref 43–77)

## 2012-05-16 MED ORDER — INSULIN ASPART 100 UNIT/ML ~~LOC~~ SOLN
0.0000 [IU] | Freq: Three times a day (TID) | SUBCUTANEOUS | Status: DC
  Administered 2012-05-16: 3 [IU] via SUBCUTANEOUS
  Administered 2012-05-16 – 2012-05-17 (×2): 1 [IU] via SUBCUTANEOUS

## 2012-05-16 MED ORDER — INFLUENZA VIRUS VACC SPLIT PF IM SUSP
0.5000 mL | Freq: Once | INTRAMUSCULAR | Status: DC
  Filled 2012-05-16: qty 0.5

## 2012-05-16 MED ORDER — INSULIN GLARGINE 100 UNIT/ML ~~LOC~~ SOLN
6.0000 [IU] | Freq: Two times a day (BID) | SUBCUTANEOUS | Status: DC
  Administered 2012-05-16: 6 [IU] via SUBCUTANEOUS

## 2012-05-16 MED ORDER — INSULIN ASPART 100 UNIT/ML ~~LOC~~ SOLN: 0.0000 [IU] | Freq: Every day | SUBCUTANEOUS | Status: DC

## 2012-05-16 MED ORDER — BIOTENE DRY MOUTH MT LIQD
15.0000 mL | Freq: Two times a day (BID) | OROMUCOSAL | Status: DC
  Administered 2012-05-16 – 2012-05-17 (×3): 15 mL via OROMUCOSAL

## 2012-05-16 MED ORDER — LEVALBUTEROL HCL 0.63 MG/3ML IN NEBU
INHALATION_SOLUTION | RESPIRATORY_TRACT | Status: AC
  Administered 2012-05-16: 0.63 mg via RESPIRATORY_TRACT
  Filled 2012-05-16: qty 3

## 2012-05-16 MED ORDER — LEVALBUTEROL HCL 0.63 MG/3ML IN NEBU
0.6300 mg | INHALATION_SOLUTION | RESPIRATORY_TRACT | Status: DC
  Administered 2012-05-16 – 2012-05-17 (×6): 0.63 mg via RESPIRATORY_TRACT
  Filled 2012-05-16 (×5): qty 3

## 2012-05-16 MED ORDER — PREDNISONE 20 MG PO TABS
40.0000 mg | ORAL_TABLET | Freq: Two times a day (BID) | ORAL | Status: DC
  Administered 2012-05-16 – 2012-05-17 (×2): 40 mg via ORAL
  Filled 2012-05-16 (×2): qty 2

## 2012-05-16 NOTE — Progress Notes (Signed)
Patients O2 stat 96-97% on RA this afternoon

## 2012-05-16 NOTE — Care Management Note (Unsigned)
    Page 1 of 1   05/16/2012     11:39:29 AM   CARE MANAGEMENT NOTE 05/16/2012  Patient:  Antonio Baker,Antonio Baker   Account Number:  [id number]  Date Initiated:  05/16/2012  Documentation initiated by:  Sharrie Rothman  Subjective/Objective Assessment:   Pt admitted from home with pneumonia. Pt lives alone and is independent with ADLs'.     Action/Plan:   Will evaluate need for home O2. No other CM or HH needs noted.   Anticipated DC Date:  05/17/2012   Anticipated DC Plan:  HOME/SELF CARE      DC Planning Services  CM consult      Choice offered to / List presented to:             Status of service:  In process, will continue to follow Medicare Important Message given?   (If response is "NO", the following Medicare IM given date fields will be blank) Date Medicare IM given:   Date Additional Medicare IM given:    Discharge Disposition:    Per UR Regulation:    If discussed at Long Length of Stay Meetings, dates discussed:    Comments:  05/16/12 1138 Arlyss Queen, RN BSN CM

## 2012-05-16 NOTE — Progress Notes (Signed)
Subjective: The patient says that his cough is less on the cough medicine started. He has less chest congestion. Overall, he feels a little better.  Objective: Vital signs in last 24 hours: Filed Vitals:   05/16/12 0544 05/16/12 0656 05/16/12 0741 05/16/12 1115  BP: 155/100 164/96    Pulse: 113     Temp: 97.9 F (36.6 C)     TempSrc: Oral     Resp: 20     Height:      Weight:      SpO2: 97%  95% 94%    Intake/Output Summary (Last 24 hours) at 05/16/12 1138 Last data filed at 05/16/12 0505  Gross per 24 hour  Intake    300 ml  Output    400 ml  Net   -100 ml    Weight change:   Physical exam: General: 56 year old Caucasian man laying in bed, in no acute distress. Lungs: Rare expiratory wheeze, no crackles. Breathing nonlabored. Heart: S1, S2, with borderline tachycardia. Abdomen: Positive bowel sounds, soft, nontender, nondistended. Extremities: No pedal edema.  Lab Results: Basic Metabolic Panel:  Basename 05/16/12 0641 05/14/12 2328  NA 135 136  K 4.2 4.6  CL 102 101  CO2 24 25  GLUCOSE 155* 115*  BUN 14 19  CREATININE 0.55 0.74  CALCIUM 10.8* 10.4  MG -- --  PHOS -- --   Liver Function Tests: No results found for this basename: AST:2,ALT:2,ALKPHOS:2,BILITOT:2,PROT:2,ALBUMIN:2 in the last 72 hours No results found for this basename: LIPASE:2,AMYLASE:2 in the last 72 hours No results found for this basename: AMMONIA:2 in the last 72 hours CBC:  Basename 05/16/12 0641 05/14/12 2328  WBC 21.3* 20.5*  NEUTROABS 19.2* 14.0*  HGB 13.3 14.0  HCT 39.6 41.7  MCV 91.7 93.1  PLT 309 324   Cardiac Enzymes:  Basename 05/14/12 2328  CKTOTAL --  CKMB --  CKMBINDEX --  TROPONINI <0.30   BNP:  Basename 05/15/12 0500  PROBNP 8.9   D-Dimer: No results found for this basename: DDIMER:2 in the last 72 hours CBG:  Basename 05/16/12 0719 05/15/12 1819 05/15/12 1221  GLUCAP 150* 165* 139*   Hemoglobin A1C: No results found for this basename: HGBA1C in the  last 72 hours Fasting Lipid Panel: No results found for this basename: CHOL,HDL,LDLCALC,TRIG,CHOLHDL,LDLDIRECT in the last 72 hours Thyroid Function Tests: No results found for this basename: TSH,T4TOTAL,FREET4,T3FREE,THYROIDAB in the last 72 hours Anemia Panel: No results found for this basename: VITAMINB12,FOLATE,FERRITIN,TIBC,IRON,RETICCTPCT in the last 72 hours Coagulation: No results found for this basename: LABPROT:2,INR:2 in the last 72 hours Urine Drug Screen: Drugs of Abuse  No results found for this basename: labopia,  cocainscrnur,  labbenz,  amphetmu,  thcu,  labbarb    Alcohol Level: No results found for this basename: ETH:2 in the last 72 hours Urinalysis: No results found for this basename: COLORURINE:2,APPERANCEUR:2,LABSPEC:2,PHURINE:2,GLUCOSEU:2,HGBUR:2,BILIRUBINUR:2,KETONESUR:2,PROTEINUR:2,UROBILINOGEN:2,NITRITE:2,LEUKOCYTESUR:2 in the last 72 hours Misc. Labs:   Micro: Recent Results (from the past 240 hour(s))  CULTURE, BLOOD (ROUTINE X 2)     Status: Normal (Preliminary result)   Collection Time   05/14/12 11:28 PM      Component Value Range Status Comment   Specimen Description Blood RIGHT ARM   Final    Special Requests     Final    Value: BOTTLES DRAWN AEROBIC AND ANAEROBIC 6 CC IN ANA AND 8 CC IN AEB   Culture NO GROWTH 1 DAY   Final    Report Status PENDING   Incomplete   CULTURE, BLOOD (  ROUTINE X 2)     Status: Normal (Preliminary result)   Collection Time   05/14/12 11:45 PM      Component Value Range Status Comment   Specimen Description Blood RIGHT HAND   Final    Special Requests     Final    Value: BOTTLES DRAWN AEROBIC AND ANAEROBIC 5 CC IN ANA AND 6 CC IN AEB   Culture NO GROWTH 1 DAY   Final    Report Status PENDING   Incomplete   CULTURE, EXPECTORATED SPUTUM-ASSESSMENT     Status: Normal   Collection Time   05/16/12  7:00 AM      Component Value Range Status Comment   Specimen Description SPUTUM   Final    Special Requests NONE   Final     Sputum evaluation     Final    Value: MICROSCOPIC FINDINGS SUGGEST THAT THIS SPECIMEN IS NOT REPRESENTATIVE OF LOWER RESPIRATORY SECRETIONS. PLEASE RECOLLECT.     CALLED TO [facility],M AT 0840 BY HUFFINES,S ON 05/16/12   Report Status 05/16/2012 FINAL   Final     Studies/Results: Dg Chest 2 View  05/14/2012  *RADIOLOGY REPORT*  Clinical Data: Worsening shortness of breath, cough and wheezing.  CHEST - 2 VIEW  Comparison: Chest radiograph performed 05/03/2012, and CT of the chest performed 04/22/2007  Findings: The lungs are well-aerated.  Mild bibasilar airspace opacities appear worsened from the prior study, and could reflect mild pneumonia.  Underlying chronic peribronchial thickening is noted.  There is no evidence of pleural effusion or pneumothorax.  The heart is normal in size; the mediastinal contour is within normal limits.  No acute osseous abnormalities are seen.  IMPRESSION:  1.  Worsening mild bibasilar airspace opacities could reflect mild pneumonia. 2.  Underlying chronic peribronchial thickening noted.   Original Report Authenticated By: Tonia Ghent, M.D.     Medications:  Scheduled:    . allopurinol  300 mg Oral Daily  . antiseptic oral rinse  15 mL Mouth Rinse BID  . azithromycin  500 mg Intravenous Q24H  . benzonatate  100 mg Oral TID  . carvedilol  25 mg Oral BID WC  . cefTRIAXone (ROCEPHIN)  IV  1 g Intravenous Q24H  . chlorpheniramine-HYDROcodone  5 mL Oral Q12H  . insulin aspart  0-20 Units Subcutaneous TID WC  . insulin aspart  0-5 Units Subcutaneous QHS  . insulin glargine  12 Units Subcutaneous BID  . ipratropium  0.5 mg Nebulization Q4H  . irbesartan  150 mg Oral Daily  . levalbuterol  0.63 mg Nebulization Q4H  . methylPREDNISolone (SOLU-MEDROL) injection  60 mg Intravenous Q6H  . simvastatin  40 mg Oral QPM   Continuous:  MVH:[id number], acetaminophen, albuterol, albuterol, ALPRAZolam, alum & mag hydroxide-simeth, guaiFENesin-dextromethorphan,  HYDROcodone-acetaminophen, ondansetron (ZOFRAN) IV, ondansetron, senna-docusate, zolpidem  Assessment: Active Problems:  Asthma with exacerbation  CAP (community acquired pneumonia)  Hypertension   1. Community acquired pneumonia superimposed on asthma with exacerbation; query acute bronchitis as well. Influenza panel negative. We'll continue antibiotic treatment with azithromycin and Rocephin. Continue albuterol and Atrovent nebulizers. I expanded his steroid treatment with Solu-Medrol to every 6 hours, but will decrease the dosing given his symptomatic improvement. Continue Tussionex and Tessalon Perles. Influenza panel negative. HIV nonreactive. Blood cultures negative. Sputum culture pending. Legionella antigen and strep pneumo antigen pending.  Hypertension. Currently elevated, but this is likely secondary to IV steroids. We'll continue ARB as ordered.  Sinus tachycardia. Secondary to bronchodilators.  Leukocytosis, secondary to  pneumonia and steroids. He is afebrile.  Steroid-induced hyperglycemia. Continues sliding scale NovoLog and Lantus.  Plan:  1. We'll decrease the frequency of nebulizers. Will discontinue Solu-Medrol in favor of prednisone. Will decrease albuterol in favor of Xopenex. 2. Encouraged patient to increase activity. 3. Possible discharge tomorrow.   LOS: 2 days   Antonio Baker 05/16/2012, 11:38 AM

## 2012-05-16 NOTE — Progress Notes (Signed)
Patient's blood pressure had been running a little high at 157/98 and patient stated he had not had any of his regular BP meds. Nurse looked at [facility] and BP meds had not been ordered. Doctor was notified and new orders were given.

## 2012-05-17 DIAGNOSIS — I10 Essential (primary) hypertension: Secondary | ICD-10-CM

## 2012-05-17 DIAGNOSIS — R7309 Other abnormal glucose: Secondary | ICD-10-CM

## 2012-05-17 DIAGNOSIS — T50904A Poisoning by unspecified drugs, medicaments and biological substances, undetermined, initial encounter: Secondary | ICD-10-CM

## 2012-05-17 LAB — GLUCOSE, CAPILLARY: Glucose-Capillary: 130 mg/dL — ABNORMAL HIGH (ref 70–99)

## 2012-05-17 MED ORDER — AZITHROMYCIN 250 MG PO TABS
500.0000 mg | ORAL_TABLET | Freq: Every day | ORAL | Status: DC
  Administered 2012-05-17: 500 mg via ORAL
  Filled 2012-05-17: qty 2

## 2012-05-17 MED ORDER — ALBUTEROL SULFATE HFA 108 (90 BASE) MCG/ACT IN AERS: 2.0000 | INHALATION_SPRAY | Freq: Three times a day (TID) | RESPIRATORY_TRACT | Status: DC

## 2012-05-17 MED ORDER — PREDNISONE 10 MG PO TABS: ORAL_TABLET | ORAL | Status: DC

## 2012-05-17 MED ORDER — BENZONATATE 100 MG PO CAPS: 100.0000 mg | ORAL_CAPSULE | Freq: Three times a day (TID) | ORAL | Status: DC

## 2012-05-17 MED ORDER — HYDROCOD POLST-CHLORPHEN POLST 10-8 MG/5ML PO LQCR: 5.0000 mL | Freq: Two times a day (BID) | ORAL | Status: DC | PRN

## 2012-05-17 MED ORDER — LEVALBUTEROL HCL 0.63 MG/3ML IN NEBU
0.6300 mg | INHALATION_SOLUTION | Freq: Three times a day (TID) | RESPIRATORY_TRACT | Status: DC
Start: 2012-05-17 — End: 2012-05-17

## 2012-05-17 MED ORDER — CEFUROXIME AXETIL 500 MG PO TABS: 500.0000 mg | ORAL_TABLET | Freq: Two times a day (BID) | ORAL | Status: DC

## 2012-05-17 MED ORDER — DEXTROSE 5 % IV SOLN
1.0000 g | INTRAVENOUS | Status: DC
  Administered 2012-05-17: 1 g via INTRAVENOUS
  Filled 2012-05-17 (×2): qty 10

## 2012-05-17 NOTE — Discharge Summary (Signed)
Physician Discharge Summary  Antonio Baker:[id number] DOB: 1956-04-30 DOA: 05/14/2012  PCP: No primary provider on file.  Admit date: 05/14/2012 Discharge date: 05/17/2012  Time spent: greater than 30 minutes  Recommendations for Outpatient Follow-up:  1. The patient was instructed to followup with his primary care provider in one to 2 weeks.  Discharge Diagnoses:  1. Community-acquired pneumonia. 2. Asthma with exacerbation/acute bronchitis. 3. Sinus tachycardia secondary to respiratory distress and bronchodilator therapy. 4. Hypertension. 5. Steroid-induced hyperglycemia. 6. Steroid-induced leukocytosis.  Discharge Condition: Improved and stable.  Diet recommendation: Heart healthy.  Filed Weights   05/14/12 2126 05/15/12 0458  Weight: 99.791 kg (220 lb) 98.1 kg (216 lb 4.3 oz)    History of present illness:  The patient is a 56 year old man with a history significant for asthma, hypertension, and gout, who presented to the emergency department on 05/15/2012 with a chief complaint of shortness of breath. He had actually been treated in the emergency department on 05/03/2012 for an asthma exacerbation with bronchodilators and steroids. In the emergency department, he was afebrile and tachycardic with a heart rate ranging from 100-120 beats per minute. He was oxygenating 94-96% on nasal cannula oxygen. His lab data were significant for a normal troponin I., white blood cell count of 20.5, and a chest x-ray that revealed worsening bibasilar airspace opacities, consistent with pneumonia and underlying chronic peribronchial thickening. He was admitted for further evaluation and management.  Hospital Course:  The patient was started on treatment for community-acquired pneumonia with IV azithromycin and Rocephin. IV Solu-Medrol and bronchodilator therapy with albuterol and Xopenex nebulizers were ordered and given for treatment of asthma exacerbation/acute bronchitis. Oxygen was applied  and titrated to keep his oxygen saturations greater than 90% and also for comfort. Symptomatic treatment was started with Jerilynn Som and Tussionex for what appeared to be an unrelenting cough. Most of not all of his chronic medications were continued. His tachycardia was thought to be secondary to his pulmonary symptoms and bronchodilators. His EKG revealed sinus tachycardia with a heart rate of 103 beats per minute. For further evaluation, a number of studies were ordered. His blood cultures were negative to date. Influenza panel was negative. His HIV was nonreactive. His pneumococcal urinary antigen was negative. His sputum was not representative of lower respiratory secretions. Legionella antigen was still pending at the time of discharge.  Sliding scale NovoLog was ordered and given for treatment of steroid-induced hyperglycemia. Gentle IV fluids were given for approximately 24 hours.  The patient improved clinically and symptomatically. At the time of discharge, he had no complaints of shortness of breath or wheezing. On exam, there was resolution of pulmonary crackles and bronchospasms. He received 3 days of IV antibiotics and 2 days of intravenous steroids. He was discharged on 5 more days of Ceftin and 5 more days of the prednisone taper. He was advised to use his albuterol inhaler 3 times daily for 5-7 more days and then every 4 hours as needed thereafter. He voiced understanding.  Procedures:  None  Consultations:  None  Discharge Exam: Filed Vitals:   05/16/12 2125 05/17/12 0001 05/17/12 0609 05/17/12 0655  BP:   160/89   Pulse:   85   Temp:   97.9 F (36.6 C)   TempSrc:   Oral   Resp:   18   Height:      Weight:      SpO2: 94% 93% 96% 95%    General: Pleasant alert 56 year old Caucasian man sitting up in  bed, in no acute distress. He looks significantly improved. Cardiovascular: S1, S2, with no murmurs rubs or gallops. Respiratory: Clear to auscultation  bilaterally.  Discharge Instructions  Discharge Orders    Future Orders Please Complete By Expires   Diet - low sodium heart healthy      Increase activity slowly      Discharge instructions      Comments:   Use your inhaler 2 puffs, 3 times daily for one week and then every 4 hours thereafter. Take antibiotic and prednisone taper until completion.       Medication List     As of 05/17/2012  1:53 PM    TAKE these medications         albuterol 108 (90 BASE) MCG/ACT inhaler   Commonly known as: PROVENTIL HFA;VENTOLIN HFA   Inhale 2 puffs into the lungs 3 (three) times daily. And every 4 hours as needed for wheezing, chest congestion, or shortness of breath.      allopurinol 300 MG tablet   Commonly known as: ZYLOPRIM   Take 300 mg by mouth daily.      benzonatate 100 MG capsule   Commonly known as: TESSALON   Take 1 capsule (100 mg total) by mouth 3 (three) times daily. For cough.      carvedilol 25 MG tablet   Commonly known as: COREG   Take 25 mg by mouth 2 (two) times daily with a meal.      cefUROXime 500 MG tablet   Commonly known as: CEFTIN   Take 1 tablet (500 mg total) by mouth 2 (two) times daily. Antibiotic. Take for 5 more days, starting tomorrow.      chlorpheniramine-HYDROcodone 10-8 MG/5ML Lqcr   Commonly known as: TUSSIONEX   Take 5 mLs by mouth every 12 (twelve) hours as needed.      HYDROcodone-acetaminophen 10-325 MG per tablet   Commonly known as: NORCO   Take 1 tablet by mouth every 6 (six) hours as needed. pain      predniSONE 10 MG tablet   Commonly known as: DELTASONE   Starting tomorrow, take 5 tablets for 1 day; then 4 tablets the next day; then 3 tablets the next day; then 2 tablets the next day; then 1 tablet the next day; then stop.      PRESCRIPTION MEDICATION   Take 1 tablet by mouth 2 (two) times daily.      simvastatin 40 MG tablet   Commonly known as: ZOCOR   Take 40 mg by mouth every evening.      valsartan 160 MG tablet    Commonly known as: DIOVAN   Take 160 mg by mouth daily.           Follow-up Information    Please follow up. (Followup with your primary care physician in one to 2 weeks.)           The results of significant diagnostics from this hospitalization (including imaging, microbiology, ancillary and laboratory) are listed below for reference.    Significant Diagnostic Studies: Dg Chest 2 View  05/14/2012  *RADIOLOGY REPORT*  Clinical Data: Worsening shortness of breath, cough and wheezing.  CHEST - 2 VIEW  Comparison: Chest radiograph performed 05/03/2012, and CT of the chest performed 04/22/2007  Findings: The lungs are well-aerated.  Mild bibasilar airspace opacities appear worsened from the prior study, and could reflect mild pneumonia.  Underlying chronic peribronchial thickening is noted.  There is no evidence of pleural effusion or  pneumothorax.  The heart is normal in size; the mediastinal contour is within normal limits.  No acute osseous abnormalities are seen.  IMPRESSION:  1.  Worsening mild bibasilar airspace opacities could reflect mild pneumonia. 2.  Underlying chronic peribronchial thickening noted.   Original Report Authenticated By: Tonia Ghent, M.D.    Dg Chest 2 View  05/04/2012  *RADIOLOGY REPORT*  Clinical Data: Cough and congestion.  Shortness of breath.  Chills.  CHEST - 2 VIEW  Comparison: 04/22/2007  Findings: Normal heart size and pulmonary vascularity. Peribronchial thickening and central interstitial changes suggesting chronic bronchitis.  No focal airspace consolidation in the lungs.  No blunting of costophrenic angles.  No pneumothorax. Mediastinal contours appear intact.  Calcification of the aorta. Old right rib fractures.  Nodular changes seen on previous study are not well appreciated today.  Degenerative changes in the thoracic spine.  IMPRESSION: Chronic bronchitic changes in the lungs.  No evidence of active pulmonary disease.   Original Report Authenticated  By: Burman Nieves, M.D.     Microbiology: Recent Results (from the past 240 hour(s))  CULTURE, BLOOD (ROUTINE X 2)     Status: Normal (Preliminary result)   Collection Time   05/14/12 11:28 PM      Component Value Range Status Comment   Specimen Description Blood RIGHT ARM   Final    Special Requests     Final    Value: BOTTLES DRAWN AEROBIC AND ANAEROBIC 6 CC IN ANA AND 8 CC IN AEB   Culture NO GROWTH 3 DAYS   Final    Report Status PENDING   Incomplete   CULTURE, BLOOD (ROUTINE X 2)     Status: Normal (Preliminary result)   Collection Time   05/14/12 11:45 PM      Component Value Range Status Comment   Specimen Description Blood RIGHT HAND   Final    Special Requests     Final    Value: BOTTLES DRAWN AEROBIC AND ANAEROBIC 5 CC IN ANA AND 6 CC IN AEB   Culture NO GROWTH 3 DAYS   Final    Report Status PENDING   Incomplete   CULTURE, EXPECTORATED SPUTUM-ASSESSMENT     Status: Normal   Collection Time   05/16/12  7:00 AM      Component Value Range Status Comment   Specimen Description SPUTUM   Final    Special Requests NONE   Final    Sputum evaluation     Final    Value: MICROSCOPIC FINDINGS SUGGEST THAT THIS SPECIMEN IS NOT REPRESENTATIVE OF LOWER RESPIRATORY SECRETIONS. PLEASE RECOLLECT.     CALLED TO [facility],M AT 0840 BY HUFFINES,S ON 05/16/12   Report Status 05/16/2012 FINAL   Final      Labs: Basic Metabolic Panel:  Lab 05/16/12 4098 05/14/12 2328  NA 135 136  K 4.2 4.6  CL 102 101  CO2 24 25  GLUCOSE 155* 115*  BUN 14 19  CREATININE 0.55 0.74  CALCIUM 10.8* 10.4  MG -- --  PHOS -- --   Liver Function Tests: No results found for this basename: AST:5,ALT:5,ALKPHOS:5,BILITOT:5,PROT:5,ALBUMIN:5 in the last 168 hours No results found for this basename: LIPASE:5,AMYLASE:5 in the last 168 hours No results found for this basename: AMMONIA:5 in the last 168 hours CBC:  Lab 05/16/12 0641 05/14/12 2328  WBC 21.3* 20.5*  NEUTROABS 19.2* 14.0*  HGB 13.3 14.0   HCT 39.6 41.7  MCV 91.7 93.1  PLT 309 324   Cardiac Enzymes:  Lab 05/14/12 2328  CKTOTAL --  CKMB --  CKMBINDEX --  TROPONINI <0.30   BNP: BNP (last 3 results)  Basename 05/15/12 0500  PROBNP 8.9   CBG:  Lab 05/17/12 0805 05/16/12 2048 05/16/12 1635 05/16/12 1146 05/16/12 0719  GLUCAP 130* 120* 128* 232* 150*       Signed:  Maurie Musco  [facility] 05/17/2012, 1:53 PM

## 2012-05-17 NOTE — Progress Notes (Signed)
Patient with orders to be discharge home. Discharge instructions given, patient verbalized understanding. Patient in stable condition upon discharge. Patient left in private vehicle.

## 2012-05-18 LAB — LEGIONELLA ANTIGEN, URINE

## 2012-05-19 LAB — CULTURE, BLOOD (ROUTINE X 2): Culture: NO GROWTH

## 2012-06-09 ENCOUNTER — Inpatient Hospital Stay (HOSPITAL_COMMUNITY)
Admission: EM | Admit: 2012-06-09 | Discharge: 2012-06-14 | DRG: 541 | Disposition: A | Discharge status: Home / Self Care | Payer: BC Managed Care – PPO | Attending: Family Medicine | Admitting: Family Medicine

## 2012-06-09 ENCOUNTER — Encounter (HOSPITAL_COMMUNITY): Payer: Self-pay | Admitting: *Deleted

## 2012-06-09 ENCOUNTER — Emergency Department (HOSPITAL_COMMUNITY): Payer: BC Managed Care – PPO

## 2012-06-09 DIAGNOSIS — Z23 Encounter for immunization: Secondary | ICD-10-CM

## 2012-06-09 DIAGNOSIS — K449 Diaphragmatic hernia without obstruction or gangrene: Secondary | ICD-10-CM | POA: Diagnosis present

## 2012-06-09 DIAGNOSIS — K219 Gastro-esophageal reflux disease without esophagitis: Secondary | ICD-10-CM | POA: Diagnosis present

## 2012-06-09 DIAGNOSIS — K2970 Gastritis, unspecified, without bleeding: Secondary | ICD-10-CM | POA: Diagnosis present

## 2012-06-09 DIAGNOSIS — R739 Hyperglycemia, unspecified: Secondary | ICD-10-CM

## 2012-06-09 DIAGNOSIS — B3781 Candidal esophagitis: Secondary | ICD-10-CM | POA: Diagnosis present

## 2012-06-09 DIAGNOSIS — J45901 Unspecified asthma with (acute) exacerbation: Secondary | ICD-10-CM | POA: Diagnosis present

## 2012-06-09 DIAGNOSIS — R000 Tachycardia, unspecified: Secondary | ICD-10-CM

## 2012-06-09 DIAGNOSIS — J8410 Pulmonary fibrosis, unspecified: Secondary | ICD-10-CM | POA: Diagnosis present

## 2012-06-09 DIAGNOSIS — K259 Gastric ulcer, unspecified as acute or chronic, without hemorrhage or perforation: Secondary | ICD-10-CM | POA: Diagnosis present

## 2012-06-09 DIAGNOSIS — J441 Chronic obstructive pulmonary disease with (acute) exacerbation: Secondary | ICD-10-CM | POA: Diagnosis present

## 2012-06-09 DIAGNOSIS — J9600 Acute respiratory failure, unspecified whether with hypoxia or hypercapnia: Secondary | ICD-10-CM | POA: Diagnosis present

## 2012-06-09 DIAGNOSIS — D72829 Elevated white blood cell count, unspecified: Secondary | ICD-10-CM | POA: Diagnosis present

## 2012-06-09 DIAGNOSIS — T380X5A Adverse effect of glucocorticoids and synthetic analogues, initial encounter: Secondary | ICD-10-CM | POA: Diagnosis present

## 2012-06-09 DIAGNOSIS — Z79899 Other long term (current) drug therapy: Secondary | ICD-10-CM

## 2012-06-09 DIAGNOSIS — I10 Essential (primary) hypertension: Secondary | ICD-10-CM | POA: Diagnosis present

## 2012-06-09 DIAGNOSIS — K222 Esophageal obstruction: Secondary | ICD-10-CM | POA: Diagnosis present

## 2012-06-09 DIAGNOSIS — Z87891 Personal history of nicotine dependence: Secondary | ICD-10-CM

## 2012-06-09 DIAGNOSIS — J189 Pneumonia, unspecified organism: Principal | ICD-10-CM | POA: Diagnosis present

## 2012-06-09 DIAGNOSIS — J449 Chronic obstructive pulmonary disease, unspecified: Secondary | ICD-10-CM

## 2012-06-09 LAB — COMPREHENSIVE METABOLIC PANEL
ALT: 26 U/L (ref 0–53)
Albumin: 3.3 g/dL — ABNORMAL LOW (ref 3.5–5.2)
Alkaline Phosphatase: 105 U/L (ref 39–117)
Potassium: 4.2 mEq/L (ref 3.5–5.1)
Sodium: 137 mEq/L (ref 135–145)
Total Protein: 6.8 g/dL (ref 6.0–8.3)

## 2012-06-09 LAB — CBC WITH DIFFERENTIAL/PLATELET
Basophils Absolute: 0 10*3/uL (ref 0.0–0.1)
Basophils Relative: 0 % (ref 0–1)
Eosinophils Absolute: 1 10*3/uL — ABNORMAL HIGH (ref 0.0–0.7)
Lymphs Abs: 2.2 10*3/uL (ref 0.7–4.0)
MCH: 30.8 pg (ref 26.0–34.0)
MCHC: 33.8 g/dL (ref 30.0–36.0)
Neutrophils Relative %: 63 % (ref 43–77)
Platelets: 236 10*3/uL (ref 150–400)
RBC: 3.89 MIL/uL — ABNORMAL LOW (ref 4.22–5.81)

## 2012-06-09 MED ORDER — METHYLPREDNISOLONE SODIUM SUCC 125 MG IJ SOLR
125.0000 mg | Freq: Once | INTRAMUSCULAR | Status: AC
  Administered 2012-06-09: 125 mg via INTRAVENOUS
  Filled 2012-06-09: qty 2

## 2012-06-09 MED ORDER — ALBUTEROL SULFATE (5 MG/ML) 0.5% IN NEBU
5.0000 mg | INHALATION_SOLUTION | Freq: Once | RESPIRATORY_TRACT | Status: AC
  Administered 2012-06-09: 5 mg via RESPIRATORY_TRACT
  Filled 2012-06-09: qty 1

## 2012-06-09 MED ORDER — ALBUTEROL SULFATE (5 MG/ML) 0.5% IN NEBU
5.0000 mg | INHALATION_SOLUTION | Freq: Once | RESPIRATORY_TRACT | Status: AC
  Administered 2012-06-09: 5 mg via RESPIRATORY_TRACT
  Filled 2012-06-09: qty 1
  Filled 2012-06-09: qty 0.5

## 2012-06-09 MED ORDER — IPRATROPIUM BROMIDE 0.02 % IN SOLN
0.5000 mg | Freq: Once | RESPIRATORY_TRACT | Status: AC
  Administered 2012-06-09: 0.5 mg via RESPIRATORY_TRACT
  Filled 2012-06-09: qty 2.5

## 2012-06-09 NOTE — ED Notes (Signed)
Respiratory called

## 2012-06-09 NOTE — ED Notes (Signed)
MD called for pt's 02 sats

## 2012-06-09 NOTE — ED Provider Notes (Signed)
History  This chart was scribed for Antonio Lennert, MD by Erskine Emery, ED Scribe. This patient was seen in room [facility]06[facility] and the patient's care was started at 21:05.   CSN: [id number]  Arrival date & time 06/09/12  2100   First MD Initiated Contact with Patient 06/09/12 2105      Chief Complaint  Patient presents with  . Respiratory Distress    (Consider location/radiation/quality/duration/timing/severity/associated sxs/prior Treatment) Antonio Baker is a 57 y.o. male who presents to the Emergency Department complaining of gradually worsening shortness of breath and wheezing since yesterday. Pt reports he has been having similar episodes since December 1st. Patient is a 57 y.o. male presenting with shortness of breath. The history is provided by the patient. No language interpreter was used.  Shortness of Breath  The current episode started today. The problem occurs continuously. Associated symptoms include shortness of breath and wheezing. Pertinent negatives include no chest pain and no cough. There was no intake of a foreign body. The Heimlich maneuver was not attempted. He was not exposed to toxic fumes. He has inhaled smoke recently. He has had no prior hospitalizations. He has had no prior ICU admissions. He has had no prior intubations. His past medical history is significant for asthma. His past medical history does not include eczema. He has been behaving normally. Urine output has been normal.  Pt was released from the hospital on the 25th of last month and a week later started having episodes of extreme SOB again. Pt has never smoked. Pt has a h/o COPD, HTN, gout, high cholesterol, and asthma.   Dr. Bess Harvest in [place] is [name].   Past Medical History  Diagnosis Date  . Hypertension   . Gout   . High cholesterol   . Asthma     History reviewed. No pertinent past surgical history.  No family history on file.  History  Substance Use Topics  . Smoking status:  Former Smoker    Types: Cigarettes    Quit date: 05/16/1999  . Smokeless tobacco: Not on file  . Alcohol Use: Yes      Review of Systems  Constitutional: Negative for fatigue.  HENT: Negative for congestion, sinus pressure and ear discharge.   Eyes: Negative for discharge.  Respiratory: Positive for shortness of breath and wheezing. Negative for cough.   Cardiovascular: Negative for chest pain.  Gastrointestinal: Negative for abdominal pain and diarrhea.  Genitourinary: Negative for frequency and hematuria.  Musculoskeletal: Negative for back pain.  Skin: Negative for rash.  Neurological: Negative for seizures and headaches.  Hematological: Negative.   Psychiatric/Behavioral: Negative for hallucinations.  All other systems reviewed and are negative.    Allergies  Review of patient's allergies indicates no known allergies.  Home Medications   Current Outpatient Rx  Name  Route  Sig  Dispense  Refill  . ALBUTEROL SULFATE HFA 108 (90 BASE) MCG/ACT IN AERS   Inhalation   Inhale 2 puffs into the lungs 3 (three) times daily. And every 4 hours as needed for wheezing, chest congestion, or shortness of breath.   1 Inhaler   0   . ALLOPURINOL 300 MG PO TABS   Oral   Take 300 mg by mouth daily.         [name] BENZONATATE 100 MG PO CAPS   Oral   Take 1 capsule (100 mg total) by mouth 3 (three) times daily. For cough.   20 capsule   1   .  CARVEDILOL 25 MG PO TABS   Oral   Take 25 mg by mouth 2 (two) times daily with a meal.         . CEFUROXIME AXETIL 500 MG PO TABS   Oral   Take 1 tablet (500 mg total) by mouth 2 (two) times daily. Antibiotic. Take for 5 more days, starting tomorrow.   10 tablet   0   . HYDROCOD POLST-CPM POLST ER 10-8 MG/5ML PO LQCR   Oral   Take 5 mLs by mouth every 12 (twelve) hours as needed.   70 mL   0   . HYDROCODONE-ACETAMINOPHEN 10-325 MG PO TABS   Oral   Take 1 tablet by mouth every 6 (six) hours as needed. pain         . PREDNISONE  10 MG PO TABS      Starting tomorrow, take 5 tablets for 1 day; then 4 tablets the next day; then 3 tablets the next day; then 2 tablets the next day; then 1 tablet the next day; then stop.   15 tablet   0   . PRESCRIPTION MEDICATION   Oral   Take 1 tablet by mouth 2 (two) times daily.         [name] SIMVASTATIN 40 MG PO TABS   Oral   Take 40 mg by mouth every evening.         [name] VALSARTAN 160 MG PO TABS   Oral   Take 160 mg by mouth daily.           There were no vitals taken for this visit.  Physical Exam  Nursing note and vitals reviewed. Constitutional: He is oriented to person, place, and time. He appears well-developed.  HENT:  Head: Normocephalic and atraumatic.  Eyes: Conjunctivae normal and EOM are normal. No scleral icterus.  Neck: Neck supple. No thyromegaly present.  Cardiovascular: Regular rhythm.  Tachycardia present.  Exam reveals no gallop and no friction rub.   No murmur heard. Pulmonary/Chest: No stridor. He has wheezes. He has no rales. He exhibits no tenderness.       Moderate wheezing throughout  Abdominal: He exhibits no distension. There is no tenderness. There is no rebound.  Musculoskeletal: Normal range of motion. He exhibits no edema.  Lymphadenopathy:    He has no cervical adenopathy.  Neurological: He is oriented to person, place, and time. Coordination normal.  Skin: No rash noted. No erythema.  Psychiatric: He has a normal mood and affect. His behavior is normal.    ED Course  Procedures (including critical care time) DIAGNOSTIC STUDIES: Oxygen Saturation is 63% on room air, low by my interpretation.    COORDINATION OF CARE: 21:10--I evaluated the patient. His oxygen saturation is now 100% on a respirator.   21:51--I rechecked the pt. He is still around 98-100% on 4L of O2.  Results for orders placed during the hospital encounter of 06/09/12  CBC WITH DIFFERENTIAL      Component Value Range   WBC 11.3 (*) 4.0 - 10.5 K/uL   RBC  3.89 (*) 4.22 - 5.81 MIL/uL   Hemoglobin 12.0 (*) 13.0 - 17.0 g/dL   HCT 45.4 (*) 09.8 - 11.9 %   MCV 91.3  78.0 - 100.0 fL   MCH 30.8  26.0 - 34.0 pg   MCHC 33.8  30.0 - 36.0 g/dL   RDW 14.7  82.9 - 56.2 %   Platelets 236  150 - 400 K/uL   Neutrophils Relative  63  43 - 77 %   Neutro Abs 7.1  1.7 - 7.7 K/uL   Lymphocytes Relative 19  12 - 46 %   Lymphs Abs 2.2  0.7 - 4.0 K/uL   Monocytes Relative 8  3 - 12 %   Monocytes Absolute 0.9  0.1 - 1.0 K/uL   Eosinophils Relative 9 (*) 0 - 5 %   Eosinophils Absolute 1.0 (*) 0.0 - 0.7 K/uL   Basophils Relative 0  0 - 1 %   Basophils Absolute 0.0  0.0 - 0.1 K/uL  COMPREHENSIVE METABOLIC PANEL      Component Value Range   Sodium 137  135 - 145 mEq/L   Potassium 4.2  3.5 - 5.1 mEq/L   Chloride 102  96 - 112 mEq/L   CO2 24  19 - 32 mEq/L   Glucose, Bld 107 (*) 70 - 99 mg/dL   BUN 10  6 - 23 mg/dL   Creatinine, Ser 6.96  0.50 - 1.35 mg/dL   Calcium 9.1  8.4 - 29.5 mg/dL   Total Protein 6.8  6.0 - 8.3 g/dL   Albumin 3.3 (*) 3.5 - 5.2 g/dL   AST 16  0 - 37 U/L   ALT 26  0 - 53 U/L   Alkaline Phosphatase 105  39 - 117 U/L   Total Bilirubin 0.2 (*) 0.3 - 1.2 mg/dL   GFR calc non Af Amer >90  >90 mL/min   GFR calc Af Amer >90  >90 mL/min  PRO B NATRIURETIC PEPTIDE      Component Value Range   Pro B Natriuretic peptide (BNP) 125.7 (*) 0 - 125 pg/mL  TROPONIN I      Component Value Range   Troponin I <0.30  <0.30 ng/mL   Dg Chest 2 View  05/14/2012  *RADIOLOGY REPORT*  Clinical Data: Worsening shortness of breath, cough and wheezing.  CHEST - 2 VIEW  Comparison: Chest radiograph performed 05/03/2012, and CT of the chest performed 04/22/2007  Findings: The lungs are well-aerated.  Mild bibasilar airspace opacities appear worsened from the prior study, and could reflect mild pneumonia.  Underlying chronic peribronchial thickening is noted.  There is no evidence of pleural effusion or pneumothorax.  The heart is normal in size; the mediastinal  contour is within normal limits.  No acute osseous abnormalities are seen.  IMPRESSION:  1.  Worsening mild bibasilar airspace opacities could reflect mild pneumonia. 2.  Underlying chronic peribronchial thickening noted.   Original Report Authenticated By: Tonia Ghent, M.D.    Dg Chest Port 1 View  06/09/2012  *RADIOLOGY REPORT*  Clinical Data: Severe dyspnea.  PORTABLE CHEST - 1 VIEW  Comparison: 05/14/2012  Findings: Borderline heart size with normal pulmonary vascularity. Peribronchial thickening and perihilar infiltrates suggesting chronic bronchitis.  No developing focal consolidation.  Suggestion of bronchiectasis in the lung bases.  No pneumothorax.  Mediastinal contours appear intact.  IMPRESSION: Chronic bronchitic changes with possible bronchiectasis in the lungs.  No focal acute consolidation is identified.   Original Report Authenticated By: Burman Nieves, M.D.       No diagnosis found.    MDM     Date: 06/09/2012  Rate: 116  Rhythm: sinus tachycardia  QRS Axis: normal  Intervals: normal  ST/T Wave abnormalities: nonspecific ST changes  Conduction Disutrbances:none  Narrative Interpretation:   Old EKG Reviewed: unchanged      The chart was scribed for me under my direct supervision.  I personally performed the history,  physical, and medical decision making and all procedures in the evaluation of this patient.Antonio Lennert, MD 06/09/12 2256

## 2012-06-09 NOTE — ED Notes (Signed)
Sob, wheeze.

## 2012-06-10 ENCOUNTER — Emergency Department (HOSPITAL_COMMUNITY): Payer: BC Managed Care – PPO

## 2012-06-10 ENCOUNTER — Encounter (HOSPITAL_COMMUNITY): Payer: Self-pay | Admitting: *Deleted

## 2012-06-10 DIAGNOSIS — K219 Gastro-esophageal reflux disease without esophagitis: Secondary | ICD-10-CM

## 2012-06-10 DIAGNOSIS — I10 Essential (primary) hypertension: Secondary | ICD-10-CM

## 2012-06-10 DIAGNOSIS — J441 Chronic obstructive pulmonary disease with (acute) exacerbation: Secondary | ICD-10-CM | POA: Diagnosis present

## 2012-06-10 DIAGNOSIS — R1310 Dysphagia, unspecified: Secondary | ICD-10-CM

## 2012-06-10 DIAGNOSIS — D649 Anemia, unspecified: Secondary | ICD-10-CM

## 2012-06-10 LAB — INFLUENZA PANEL BY PCR (TYPE A & B): H1N1 flu by pcr: NOT DETECTED

## 2012-06-10 MED ORDER — HYDROCOD POLST-CHLORPHEN POLST 10-8 MG/5ML PO LQCR
5.0000 mL | Freq: Two times a day (BID) | ORAL | Status: AC
  Administered 2012-06-10 – 2012-06-11 (×4): 5 mL via ORAL
  Filled 2012-06-10 (×3): qty 5

## 2012-06-10 MED ORDER — CARVEDILOL 12.5 MG PO TABS
25.0000 mg | ORAL_TABLET | Freq: Two times a day (BID) | ORAL | Status: DC
  Administered 2012-06-10 – 2012-06-13 (×7): 25 mg via ORAL
  Filled 2012-06-10 (×3): qty 2
  Filled 2012-06-10: qty 1
  Filled 2012-06-10: qty 2
  Filled 2012-06-10: qty 1
  Filled 2012-06-10 (×2): qty 2
  Filled 2012-06-10: qty 1
  Filled 2012-06-10: qty 2

## 2012-06-10 MED ORDER — ALBUTEROL SULFATE (5 MG/ML) 0.5% IN NEBU
2.5000 mg | INHALATION_SOLUTION | Freq: Four times a day (QID) | RESPIRATORY_TRACT | Status: DC
  Administered 2012-06-10 (×2): 2.5 mg via RESPIRATORY_TRACT
  Filled 2012-06-10 (×2): qty 0.5

## 2012-06-10 MED ORDER — ONDANSETRON HCL 4 MG PO TABS: 4.0000 mg | ORAL_TABLET | Freq: Four times a day (QID) | ORAL | Status: DC | PRN

## 2012-06-10 MED ORDER — CLONIDINE HCL 0.2 MG PO TABS
0.2000 mg | ORAL_TABLET | Freq: Two times a day (BID) | ORAL | Status: DC
  Administered 2012-06-10 – 2012-06-14 (×8): 0.2 mg via ORAL
  Filled 2012-06-10 (×8): qty 1

## 2012-06-10 MED ORDER — METHYLPREDNISOLONE SODIUM SUCC 125 MG IJ SOLR
60.0000 mg | Freq: Two times a day (BID) | INTRAMUSCULAR | Status: DC
  Administered 2012-06-10: 60 mg via INTRAVENOUS
  Filled 2012-06-10: qty 2

## 2012-06-10 MED ORDER — IOHEXOL 300 MG/ML  SOLN
100.0000 mL | Freq: Once | INTRAMUSCULAR | Status: AC | PRN
  Administered 2012-06-10: 100 mL via INTRAVENOUS

## 2012-06-10 MED ORDER — IPRATROPIUM BROMIDE 0.02 % IN SOLN
0.5000 mg | Freq: Four times a day (QID) | RESPIRATORY_TRACT | Status: DC
  Administered 2012-06-10 (×2): 0.5 mg via RESPIRATORY_TRACT
  Filled 2012-06-10 (×2): qty 2.5

## 2012-06-10 MED ORDER — ASPIRIN EC 81 MG PO TBEC
81.0000 mg | DELAYED_RELEASE_TABLET | Freq: Every day | ORAL | Status: DC
  Administered 2012-06-10 – 2012-06-14 (×5): 81 mg via ORAL
  Filled 2012-06-10 (×7): qty 1

## 2012-06-10 MED ORDER — ONDANSETRON HCL 4 MG/2ML IJ SOLN: 4.0000 mg | Freq: Four times a day (QID) | INTRAMUSCULAR | Status: DC | PRN

## 2012-06-10 MED ORDER — SODIUM CHLORIDE 0.9 % IV SOLN
INTRAVENOUS | Status: DC
  Administered 2012-06-10 – 2012-06-11 (×2): via INTRAVENOUS

## 2012-06-10 MED ORDER — ALBUTEROL SULFATE (5 MG/ML) 0.5% IN NEBU
2.5000 mg | INHALATION_SOLUTION | RESPIRATORY_TRACT | Status: DC
  Administered 2012-06-10 – 2012-06-11 (×6): 2.5 mg via RESPIRATORY_TRACT
  Filled 2012-06-10 (×5): qty 0.5

## 2012-06-10 MED ORDER — ENOXAPARIN SODIUM 40 MG/0.4ML ~~LOC~~ SOLN
40.0000 mg | SUBCUTANEOUS | Status: DC
  Administered 2012-06-10 – 2012-06-11 (×2): 40 mg via SUBCUTANEOUS
  Filled 2012-06-10 (×3): qty 0.4

## 2012-06-10 MED ORDER — ALBUTEROL SULFATE (5 MG/ML) 0.5% IN NEBU
2.5000 mg | INHALATION_SOLUTION | RESPIRATORY_TRACT | Status: DC | PRN
  Administered 2012-06-10 – 2012-06-11 (×2): 2.5 mg via RESPIRATORY_TRACT
  Filled 2012-06-10 (×2): qty 0.5

## 2012-06-10 MED ORDER — HYDROCOD POLST-CHLORPHEN POLST 10-8 MG/5ML PO LQCR
5.0000 mL | Freq: Two times a day (BID) | ORAL | Status: DC | PRN
  Administered 2012-06-10 – 2012-06-11 (×4): 5 mL via ORAL
  Filled 2012-06-10 (×5): qty 5

## 2012-06-10 MED ORDER — IPRATROPIUM BROMIDE 0.02 % IN SOLN
0.5000 mg | RESPIRATORY_TRACT | Status: DC
  Administered 2012-06-10 – 2012-06-11 (×6): 0.5 mg via RESPIRATORY_TRACT
  Filled 2012-06-10 (×5): qty 2.5

## 2012-06-10 MED ORDER — ALUM & MAG HYDROXIDE-SIMETH 200-200-20 MG/5ML PO SUSP: 30.0000 mL | Freq: Four times a day (QID) | ORAL | Status: DC | PRN

## 2012-06-10 MED ORDER — ALLOPURINOL 300 MG PO TABS
300.0000 mg | ORAL_TABLET | Freq: Every morning | ORAL | Status: DC
  Administered 2012-06-10 – 2012-06-14 (×5): 300 mg via ORAL
  Filled 2012-06-10 (×7): qty 1

## 2012-06-10 MED ORDER — LEVOFLOXACIN IN D5W 750 MG/150ML IV SOLN
750.0000 mg | INTRAVENOUS | Status: DC
  Administered 2012-06-10 – 2012-06-13 (×3): 750 mg via INTRAVENOUS
  Filled 2012-06-10 (×5): qty 150

## 2012-06-10 MED ORDER — GUAIFENESIN ER 600 MG PO TB12
1200.0000 mg | ORAL_TABLET | Freq: Two times a day (BID) | ORAL | Status: DC
  Administered 2012-06-10 – 2012-06-14 (×9): 1200 mg via ORAL
  Filled 2012-06-10 (×9): qty 2

## 2012-06-10 MED ORDER — PIPERACILLIN-TAZOBACTAM 3.375 G IVPB
3.3750 g | Freq: Three times a day (TID) | INTRAVENOUS | Status: DC
  Administered 2012-06-10 – 2012-06-13 (×9): 3.375 g via INTRAVENOUS
  Filled 2012-06-10 (×13): qty 50

## 2012-06-10 MED ORDER — CYCLOBENZAPRINE HCL 10 MG PO TABS: 10.0000 mg | ORAL_TABLET | Freq: Three times a day (TID) | ORAL | Status: DC | PRN

## 2012-06-10 MED ORDER — SODIUM CHLORIDE 0.9 % IJ SOLN
3.0000 mL | Freq: Two times a day (BID) | INTRAMUSCULAR | Status: DC
  Administered 2012-06-10 – 2012-06-14 (×7): 3 mL via INTRAVENOUS

## 2012-06-10 MED ORDER — IRBESARTAN 300 MG PO TABS
300.0000 mg | ORAL_TABLET | Freq: Every day | ORAL | Status: DC
  Administered 2012-06-11 – 2012-06-14 (×4): 300 mg via ORAL
  Filled 2012-06-10 (×4): qty 1

## 2012-06-10 MED ORDER — VANCOMYCIN HCL IN DEXTROSE 1-5 GM/200ML-% IV SOLN
1000.0000 mg | Freq: Three times a day (TID) | INTRAVENOUS | Status: DC
  Administered 2012-06-10 – 2012-06-12 (×6): 1000 mg via INTRAVENOUS
  Filled 2012-06-10 (×7): qty 200

## 2012-06-10 MED ORDER — PANTOPRAZOLE SODIUM 40 MG PO TBEC
40.0000 mg | DELAYED_RELEASE_TABLET | Freq: Two times a day (BID) | ORAL | Status: DC
  Administered 2012-06-10 – 2012-06-14 (×8): 40 mg via ORAL
  Filled 2012-06-10 (×8): qty 1

## 2012-06-10 MED ORDER — IRBESARTAN 150 MG PO TABS
150.0000 mg | ORAL_TABLET | Freq: Every day | ORAL | Status: DC
  Administered 2012-06-10: 150 mg via ORAL
  Filled 2012-06-10 (×3): qty 1

## 2012-06-10 MED ORDER — GI COCKTAIL ~~LOC~~
30.0000 mL | Freq: Three times a day (TID) | ORAL | Status: DC | PRN
  Administered 2012-06-10: 30 mL via ORAL
  Filled 2012-06-10: qty 30

## 2012-06-10 MED ORDER — SIMVASTATIN 20 MG PO TABS
40.0000 mg | ORAL_TABLET | Freq: Every evening | ORAL | Status: DC
  Administered 2012-06-10 – 2012-06-13 (×4): 40 mg via ORAL
  Filled 2012-06-10 (×3): qty 2
  Filled 2012-06-10: qty 1
  Filled 2012-06-10: qty 2

## 2012-06-10 MED ORDER — PANTOPRAZOLE SODIUM 40 MG PO TBEC
40.0000 mg | DELAYED_RELEASE_TABLET | Freq: Every day | ORAL | Status: DC
  Administered 2012-06-10: 40 mg via ORAL
  Filled 2012-06-10: qty 1

## 2012-06-10 MED ORDER — DOXYCYCLINE HYCLATE 100 MG PO TABS
100.0000 mg | ORAL_TABLET | Freq: Two times a day (BID) | ORAL | Status: DC
  Administered 2012-06-10: 100 mg via ORAL
  Filled 2012-06-10: qty 1

## 2012-06-10 MED ORDER — ACETAMINOPHEN 650 MG RE SUPP: 650.0000 mg | Freq: Four times a day (QID) | RECTAL | Status: DC | PRN

## 2012-06-10 MED ORDER — ACETAMINOPHEN 325 MG PO TABS: 650.0000 mg | ORAL_TABLET | Freq: Four times a day (QID) | ORAL | Status: DC | PRN

## 2012-06-10 MED ORDER — DOCUSATE SODIUM 100 MG PO CAPS
100.0000 mg | ORAL_CAPSULE | Freq: Two times a day (BID) | ORAL | Status: DC
  Administered 2012-06-10 – 2012-06-14 (×10): 100 mg via ORAL
  Filled 2012-06-10 (×15): qty 1

## 2012-06-10 MED ORDER — OXYCODONE HCL 5 MG PO TABS
5.0000 mg | ORAL_TABLET | ORAL | Status: DC | PRN
  Administered 2012-06-10 – 2012-06-14 (×11): 5 mg via ORAL
  Filled 2012-06-10 (×11): qty 1

## 2012-06-10 MED ORDER — METHYLPREDNISOLONE SODIUM SUCC 125 MG IJ SOLR
125.0000 mg | Freq: Four times a day (QID) | INTRAMUSCULAR | Status: DC
  Administered 2012-06-10 – 2012-06-11 (×5): 125 mg via INTRAVENOUS
  Filled 2012-06-10 (×5): qty 2

## 2012-06-10 NOTE — H&P (Addendum)
[facility] History and Physical  BRENEN BEIGEL XBJ:[id number] DOB: 1955-12-20 DOA: 06/09/2012  Referring physician: EDP Zammit PCP: Dr. Maurie Boettcher, [place]  Specialists: none  Chief Complaint: Dyspnea  HPI: Antonio Baker is a 57 y.o. male with largely uncharacterized lung disease who had a brief admission on 12/22 for a presumed COPD/Asthma exacerbation with CAP. He had done fairly well following his hospitalization until about 2 days ago when he started having worsening dyspnea with little or no response to his bronchodilators. Complains of severe "coughing fits", has reflux. 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   On admission, his O2 sats were in the 60's. He responded well to Os and nebs. CXR did not show PNA. Admission requested for acute respiratory failure.  Review of Systems:  Constitutional: Positive for malaise/fatigue. Negative for fever, chills, weight loss and diaphoresis.  HENT: Positive for congestion. Negative for sore throat.  Eyes: Negative.  Respiratory: Positive for cough, sputum production, shortness of breath and wheezing. Negative for stridor.  Cardiovascular: Positive for leg swelling.  Gastrointestinal: Positive for heartburn. Negative for nausea, vomiting, abdominal pain, diarrhea, constipation, blood in stool and melena.  Genitourinary: Negative.  Musculoskeletal: Positive for myalgias.  Skin: Negative for itching and rash.  Neurological: Positive for weakness and headaches. Negative for dizziness, sensory change and focal weakness.  Endo/Heme/Allergies: Negative.  Psychiatric/Behavioral: Negative for depression, hallucinations, memory loss and substance abuse. The patient is nervous/anxious and has insomnia.  All other systems reviewed and are negative.  Past Medical History  Diagnosis Date  . Hypertension   . Gout   . High cholesterol   . Asthma    History reviewed. No  pertinent past surgical history. Social History:  reports that he quit smoking about 13 years ago. His smoking use included Cigarettes. He does not have any smokeless tobacco history on file. He reports that he drinks alcohol. He reports that he does not use illicit drugs.   No Known Allergies  History reviewed. No pertinent family history. No history of lung disease.  Prior to Admission medications   Medication Sig Start Date End Date Taking? Authorizing Provider  Acetaminophen (ARTHRITIS PAIN RELIEF PO) Take 1 tablet by mouth daily as needed. For arthritis pain   Yes Historical Provider, MD  albuterol (PROVENTIL HFA;VENTOLIN HFA) 108 (90 BASE) MCG/ACT inhaler Inhale 2 puffs into the lungs 3 (three) times daily. And every 4 hours as needed for wheezing, chest congestion, or shortness of breath. 05/17/12  Yes Elliot Cousin, MD  albuterol (PROVENTIL) (5 MG/ML) 0.5% nebulizer solution Take 2.5 mg by nebulization every 4 (four) hours as needed. For shortness of breath/wheezing   Yes Historical Provider, MD  allopurinol (ZYLOPRIM) 300 MG tablet Take 300 mg by mouth every morning.    Yes Historical Provider, MD  budesonide (PULMICORT) 0.5 MG/2ML nebulizer solution Take 0.5 mg by nebulization 2 (two) times daily.   Yes Historical Provider, MD  carvedilol (COREG) 25 MG tablet Take 25 mg by mouth 2 (two) times daily with a meal.   Yes Historical Provider, MD  cyclobenzaprine (FLEXERIL) 10 MG tablet Take 10 mg by mouth 3 (three) times daily as needed. For spasms   Yes Historical Provider, MD  HYDROcodone-acetaminophen (NORCO) 10-325 MG per tablet Take 1 tablet by mouth every 6 (six) hours as needed. pain   Yes Historical Provider, MD  simvastatin (ZOCOR) 40 MG tablet Take 40 mg by mouth every evening.  Yes Historical Provider, MD  valsartan (DIOVAN) 160 MG tablet Take 160 mg by mouth daily.   Yes Historical Provider, MD   Physical Exam: Filed Vitals:   06/09/12 2300 06/10/12 0015 06/10/12 0135  06/10/12 0236  BP: 155/94  166/102   Pulse: 97 104    Temp:      TempSrc:      Resp: 18 26 23    Height:      Weight:      SpO2: 97% 98% 98% 94%  General: Mildly ill-appearing gentleman in minor distress, is able to speak in full sentences but looks very uncomfortable, cough and bronchospasm evident  Eyes: Normal  ENT: Normal  Neck: No JVD, supple  Cardiovascular: Tachycardic but regular no murmurs rubs or gallops  Respiratory: He is wheezing, overall poor air movement-lungs sound tight  Abdomen: Soft nontender active bowel sounds  Skin: No rashes or lesions  Musculoskeletal: Moves all 4 extremities  Psychiatric: Appropriate  Neurologic: Nonfocal   Labs on Admission:  Basic Metabolic Panel:  Lab 06/09/12 9147  NA 137  K 4.2  CL 102  CO2 24  GLUCOSE 107*  BUN 10  CREATININE 0.53  CALCIUM 9.1  MG --  PHOS --   Liver Function Tests:  Lab 06/09/12 2123  AST 16  ALT 26  ALKPHOS 105  BILITOT 0.2*  PROT 6.8  ALBUMIN 3.3*   No results found for this basename: LIPASE:5,AMYLASE:5 in the last 168 hours No results found for this basename: AMMONIA:5 in the last 168 hours CBC:  Lab 06/09/12 2123  WBC 11.3*  NEUTROABS 7.1  HGB 12.0*  HCT 35.5*  MCV 91.3  PLT 236   Cardiac Enzymes:  Lab 06/09/12 2123  CKTOTAL --  CKMB --  CKMBINDEX --  TROPONINI <0.30    BNP (last 3 results)  Basename 06/09/12 2123 05/15/12 0500  PROBNP 125.7* 8.9   CBG: No results found for this basename: GLUCAP:5 in the last 168 hours  Radiological Exams on Admission: Ct Chest W Contrast  06/10/2012  *RADIOLOGY REPORT*  Clinical Data: 57 year old male with shortness of breath and cough.  CT CHEST WITH CONTRAST  Technique:  Multidetector CT imaging of the chest was performed following the standard protocol during bolus administration of intravenous contrast.  Contrast: OMNIPAQUE IOHEXOL 300 MG/ML  SOLN  Comparison: 06/09/2012 and prior chest radiographs.  04/22/2007 chest CT   Findings: Upper limits of normal mediastinal lymph nodes are unchanged. The heart and great vessels are unchanged with moderate coronary artery calcifications. There are no pleural or pericardial effusions present. Mild circumferential distal esophageal wall thickening may represent esophagitis.  Scattered areas of ground-glass opacities bilaterally are noted and have a somewhat mosaic type pattern, suggesting small airway disease. There is no evidence of consolidation, discrete mass or endobronchial/endotracheal lesion. A slightly irregular opacity within the right upper lobe is unchanged and compatible with scar.  Fatty infiltration of the liver is again noted.  No acute or suspicious bony abnormalities are identified.  IMPRESSION: New scattered ground-glass opacities bilaterally suggestive of small airway disease. Small vessel disease or nonspecific inflammation/infection are considered less likely.  Distal esophageal wall thickening - unchanged and likely representing esophagitis.  Coronary artery disease.  Fatty infiltration of the liver.   Original Report Authenticated By: Harmon Pier, M.D.    Dg Chest Port 1 View  06/09/2012  *RADIOLOGY REPORT*  Clinical Data: Severe dyspnea.  PORTABLE CHEST - 1 VIEW  Comparison: 05/14/2012  Findings: Borderline heart size with normal  pulmonary vascularity. Peribronchial thickening and perihilar infiltrates suggesting chronic bronchitis.  No developing focal consolidation.  Suggestion of bronchiectasis in the lung bases.  No pneumothorax.  Mediastinal contours appear intact.  IMPRESSION: Chronic bronchitic changes with possible bronchiectasis in the lungs.  No focal acute consolidation is identified.   Original Report Authenticated By: Burman Nieves, M.D.     EKG: Independently reviewed. Sinus tachycardia.  Assessment/Plan  57 yo admitted for second time in past 4 weeks with acute respiratory failure which appears to have both obstructive and reactive components.  He completed therapy from his last admission and has been on a LABA/Steroid combo and prn albuterol followed by his PCP. He has not had any formal spirometry for pulmonary evaluation. Given this is his second admission I ordered a CT Chest to evaluated for atypical lung disease or interstitial disease which reveals small airway disease and bronchiolitis. Significance is unclear -patient has a remote smoking history but multiple occupational and home exposures to resiratory irritants.   Will treat with IV steroids, Nebulizers, will empirically treat for bacterial  Bronchitis with Doxycycline  Pulmonary Consult for full evaluation/small airway disease w/u and recs  Support with nasal cannula O2, check ambulatory and room air sats  CT incidentally found distal esophageal thickening this needs endoscopic evaluation for malignancy or hyperplasia. He reports severe reflux and frequent odynophagia.   GI consult for endoscopic evaluation of distal esophageal thickening  Started on PPI and given GI cocktail for indigestion  Disposition Plan: Home when medically stable Time spent: 70 minutes  [facility] Pager [phone]  If 7PM-7AM, please contact night-coverage [email or web address] Password [facility] 06/10/2012, 4:39 AM

## 2012-06-10 NOTE — Consult Note (Signed)
Consult requested by: [facility] Consult requested for abnormal chest CT:  HPI: This is a 57 year old who has a history of cough and congestion for the last month or so. He was hospitalized in December with what was thought to be COPD/asthma and community-acquired pneumonia. He feels like he's got worse since he has been burning wood at home. He is coughing and congested and unable to stop his cough. His CT shows ground glass opacities bilaterally which are new from 2008. He also was found to have some esophageal abnormality  Past Medical History  Diagnosis Date  . Hypertension   . Gout   . High cholesterol   . Asthma      History reviewed. No pertinent family history.   History   Social History  . Marital Status: Single    Spouse Name: N/A    Number of Children: N/A  . Years of Education: N/A   Social History Main Topics  . Smoking status: Former Smoker    Types: Cigarettes    Quit date: 05/16/1999  . Smokeless tobacco: None  . Alcohol Use: Yes  . Drug Use: No  . Sexually Active: Yes   Other Topics Concern  . None   Social History Narrative  . None     ROS: He is not coughing up much sputum. He denies any chest pain.    Objective: Vital signs in last 24 hours: Temp:  [97.7 F (36.5 C)-98.1 F (36.7 C)] 97.7 F (36.5 C) (01/18 0545) Pulse Rate:  [97-117] 101  (01/18 0545) Resp:  [18-28] 21  (01/18 0545) BP: (155-170)/(93-135) 158/93 mmHg (01/18 0545) SpO2:  [63 %-100 %] 96 % (01/18 0709) FiO2 (%):  [100 %] 100 % (01/17 2119) Weight:  [99.791 kg (220 lb)] 99.791 kg (220 lb) (01/17 2108) Weight change:  Last BM Date: 06/09/12  Intake/Output from previous day:    PHYSICAL EXAM He is coughing almost incessantly during the examination. His pupils are reactive. His nose and throat are clear. His neck is supple without masses. His chest is relatively clear with some rhonchi. His heart is regular. His abdomen is soft without masses. Extremities showed no  edema. Central nervous system examination is grossly intact  Lab Results: Basic Metabolic Panel:  Basename 06/09/12 2123  NA 137  K 4.2  CL 102  CO2 24  GLUCOSE 107*  BUN 10  CREATININE 0.53  CALCIUM 9.1  MG --  PHOS --   Liver Function Tests:  [facility] 06/09/12 2123  AST 16  ALT 26  ALKPHOS 105  BILITOT 0.2*  PROT 6.8  ALBUMIN 3.3*   No results found for this basename: LIPASE:2,AMYLASE:2 in the last 72 hours No results found for this basename: AMMONIA:2 in the last 72 hours CBC:  Basename 06/09/12 2123  WBC 11.3*  NEUTROABS 7.1  HGB 12.0*  HCT 35.5*  MCV 91.3  PLT 236   Cardiac Enzymes:  Basename 06/09/12 2123  CKTOTAL --  CKMB --  CKMBINDEX --  TROPONINI <0.30   BNP:  Basename 06/09/12 2123  PROBNP 125.7*   D-Dimer: No results found for this basename: DDIMER:2 in the last 72 hours CBG:  Basename 06/10/12 0748  GLUCAP 140*   Hemoglobin A1C: No results found for this basename: HGBA1C in the last 72 hours Fasting Lipid Panel: No results found for this basename: CHOL,HDL,LDLCALC,TRIG,CHOLHDL,LDLDIRECT in the last 72 hours Thyroid Function Tests: No results found for this basename: TSH,T4TOTAL,FREET4,T3FREE,THYROIDAB in the last 72 hours Anemia Panel: No results found  for this basename: VITAMINB12,FOLATE,FERRITIN,TIBC,IRON,RETICCTPCT in the last 72 hours Coagulation: No results found for this basename: LABPROT:2,INR:2 in the last 72 hours Urine Drug Screen: Drugs of Abuse  No results found for this basename: labopia, cocainscrnur, labbenz, amphetmu, thcu, labbarb    Alcohol Level: No results found for this basename: ETH:2 in the last 72 hours Urinalysis: No results found for this basename: COLORURINE:2,APPERANCEUR:2,LABSPEC:2,PHURINE:2,GLUCOSEU:2,HGBUR:2,BILIRUBINUR:2,KETONESUR:2,PROTEINUR:2,UROBILINOGEN:2,NITRITE:2,LEUKOCYTESUR:2 in the last 72 hours Misc. Labs:   ABGS: No results found for this basename: PHART,PCO2,PO2ART,TCO2,HCO3 in  the last 72 hours   MICROBIOLOGY: No results found for this or any previous visit (from the past 240 hour(s)).  Studies/Results: Ct Chest W Contrast  06/10/2012  *RADIOLOGY REPORT*  Clinical Data: 57 year old male with shortness of breath and cough.  CT CHEST WITH CONTRAST  Technique:  Multidetector CT imaging of the chest was performed following the standard protocol during bolus administration of intravenous contrast.  Contrast: OMNIPAQUE IOHEXOL 300 MG/ML  SOLN  Comparison: 06/09/2012 and prior chest radiographs.  04/22/2007 chest CT  Findings: Upper limits of normal mediastinal lymph nodes are unchanged. The heart and great vessels are unchanged with moderate coronary artery calcifications. There are no pleural or pericardial effusions present. Mild circumferential distal esophageal wall thickening may represent esophagitis.  Scattered areas of ground-glass opacities bilaterally are noted and have a somewhat mosaic type pattern, suggesting small airway disease. There is no evidence of consolidation, discrete mass or endobronchial/endotracheal lesion. A slightly irregular opacity within the right upper lobe is unchanged and compatible with scar.  Fatty infiltration of the liver is again noted.  No acute or suspicious bony abnormalities are identified.  IMPRESSION: New scattered ground-glass opacities bilaterally suggestive of small airway disease. Small vessel disease or nonspecific inflammation/infection are considered less likely.  Distal esophageal wall thickening - unchanged and likely representing esophagitis.  Coronary artery disease.  Fatty infiltration of the liver.   Original Report Authenticated By: Harmon Pier, M.D.    Dg Chest Port 1 View  06/09/2012  *RADIOLOGY REPORT*  Clinical Data: Severe dyspnea.  PORTABLE CHEST - 1 VIEW  Comparison: 05/14/2012  Findings: Borderline heart size with normal pulmonary vascularity. Peribronchial thickening and perihilar infiltrates suggesting chronic  bronchitis.  No developing focal consolidation.  Suggestion of bronchiectasis in the lung bases.  No pneumothorax.  Mediastinal contours appear intact.  IMPRESSION: Chronic bronchitic changes with possible bronchiectasis in the lungs.  No focal acute consolidation is identified.   Original Report Authenticated By: Burman Nieves, M.D.     Medications:  Prior to Admission:  Prescriptions prior to admission  Medication Sig Dispense Refill  . Acetaminophen (ARTHRITIS PAIN RELIEF PO) Take 1 tablet by mouth daily as needed. For arthritis pain      . albuterol (PROVENTIL HFA;VENTOLIN HFA) 108 (90 BASE) MCG/ACT inhaler Inhale 2 puffs into the lungs 3 (three) times daily. And every 4 hours as needed for wheezing, chest congestion, or shortness of breath.  1 Inhaler  0  . albuterol (PROVENTIL) (5 MG/ML) 0.5% nebulizer solution Take 2.5 mg by nebulization every 4 (four) hours as needed. For shortness of breath/wheezing      . allopurinol (ZYLOPRIM) 300 MG tablet Take 300 mg by mouth every morning.       . budesonide (PULMICORT) 0.5 MG/2ML nebulizer solution Take 0.5 mg by nebulization 2 (two) times daily.      . carvedilol (COREG) 25 MG tablet Take 25 mg by mouth 2 (two) times daily with a meal.      . cyclobenzaprine (FLEXERIL) 10  MG tablet Take 10 mg by mouth 3 (three) times daily as needed. For spasms      . HYDROcodone-acetaminophen (NORCO) 10-325 MG per tablet Take 1 tablet by mouth every 6 (six) hours as needed. pain      . simvastatin (ZOCOR) 40 MG tablet Take 40 mg by mouth every evening.      . valsartan (DIOVAN) 160 MG tablet Take 160 mg by mouth daily.       Scheduled:   . albuterol  2.5 mg Nebulization Q4H  . allopurinol  300 mg Oral q morning - 10a  . aspirin EC  81 mg Oral Daily  . carvedilol  25 mg Oral BID WC  . chlorpheniramine-HYDROcodone  5 mL Oral Q12H  . docusate sodium  100 mg Oral BID  . doxycycline  100 mg Oral Q12H  . enoxaparin (LOVENOX) injection  40 mg Subcutaneous Q24H    . guaiFENesin  1,200 mg Oral BID  . ipratropium  0.5 mg Nebulization Q4H  . irbesartan  150 mg Oral Daily  . levofloxacin (LEVAQUIN) IV  750 mg Intravenous Q24H  . methylPREDNISolone (SOLU-MEDROL) injection  125 mg Intravenous Q6H  . pantoprazole  40 mg Oral BID AC  . simvastatin  40 mg Oral QPM  . sodium chloride  3 mL Intravenous Q12H   Continuous:   . sodium chloride 50 mL/hr at 06/10/12 0247   UJW:[id number], acetaminophen, albuterol, alum & mag hydroxide-simeth, chlorpheniramine-HYDROcodone, cyclobenzaprine, gi cocktail, ondansetron (ZOFRAN) IV, ondansetron, oxyCODONE  Assesment: Since his illness appears to be acute I think groundglass opacities may be from atypical infection. I think he should be treated as if this is healthcare associated pneumonia at this point. I will increase his steroids. I will have Legionella antigen done. Principal Problem:  *COPD exacerbation Active Problems:  Pulmonary fibrosis    Plan: As above. He will also have a flutter valve and Mucinex to see if he can produce sputum    LOS: 1 day   Nastashia Gallo L 06/10/2012, 10:43 AM

## 2012-06-10 NOTE — Progress Notes (Signed)
ANTIBIOTIC CONSULT NOTE  Pharmacy Consult for Vancomycin and Zosyn  Indication: pneumonia HCAP  No Known Allergies  Patient Measurements: Height: 6\' 2"  (188 cm) Weight: 220 lb (99.791 kg) IBW/kg (Calculated) : 82.2   Vital Signs: Temp: 97.7 F (36.5 C) (01/18 0545) Temp src: Oral (01/18 0545) BP: 158/93 mmHg (01/18 0545) Pulse Rate: 101  (01/18 0545) Intake/Output from previous day:   Intake/Output from this shift: Total I/O In: 236 [P.O.:236] Out: -   Labs:  [facility] 06/09/12 2123  WBC 11.3*  HGB 12.0*  PLT 236  LABCREA --  CREATININE 0.53   Estimated Creatinine Clearance: 127 ml/min (by C-G formula based on Cr of 0.53). No results found for this basename: VANCOTROUGH:2,VANCOPEAK:2,VANCORANDOM:2,GENTTROUGH:2,GENTPEAK:2,GENTRANDOM:2,TOBRATROUGH:2,TOBRAPEAK:2,TOBRARND:2,AMIKACINPEAK:2,AMIKACINTROU:2,AMIKACIN:2, in the last 72 hours   Microbiology: Recent Results (from the past 720 hour(s))  CULTURE, BLOOD (ROUTINE X 2)     Status: Normal   Collection Time   05/14/12 11:28 PM      Component Value Range Status Comment   Specimen Description Blood RIGHT ARM   Final    Special Requests     Final    Value: BOTTLES DRAWN AEROBIC AND ANAEROBIC 6 CC IN ANA AND 8 CC IN AEB   Culture NO GROWTH 5 DAYS   Final    Report Status 05/19/2012 FINAL   Final   CULTURE, BLOOD (ROUTINE X 2)     Status: Normal   Collection Time   05/14/12 11:45 PM      Component Value Range Status Comment   Specimen Description Blood RIGHT HAND   Final    Special Requests     Final    Value: BOTTLES DRAWN AEROBIC AND ANAEROBIC 5 CC IN ANA AND 6 CC IN AEB   Culture NO GROWTH 5 DAYS   Final    Report Status 05/19/2012 FINAL   Final   CULTURE, EXPECTORATED SPUTUM-ASSESSMENT     Status: Normal   Collection Time   05/16/12  7:00 AM      Component Value Range Status Comment   Specimen Description SPUTUM   Final    Special Requests NONE   Final    Sputum evaluation     Final    Value: MICROSCOPIC  FINDINGS SUGGEST THAT THIS SPECIMEN IS NOT REPRESENTATIVE OF LOWER RESPIRATORY SECRETIONS. PLEASE RECOLLECT.     CALLED TO BULLINS,M AT 0840 BY HUFFINES,S ON 05/16/12   Report Status 05/16/2012 FINAL   Final     Medical History: Past Medical History  Diagnosis Date  . Hypertension   . Gout   . High cholesterol   . Asthma     Medications:  Scheduled:    . albuterol  2.5 mg Nebulization Q4H  . [COMPLETED] albuterol  5 mg Nebulization Once  . [COMPLETED] albuterol  5 mg Nebulization Once  . allopurinol  300 mg Oral q morning - 10a  . aspirin EC  81 mg Oral Daily  . carvedilol  25 mg Oral BID WC  . chlorpheniramine-HYDROcodone  5 mL Oral Q12H  . docusate sodium  100 mg Oral BID  . enoxaparin (LOVENOX) injection  40 mg Subcutaneous Q24H  . guaiFENesin  1,200 mg Oral BID  . [COMPLETED] ipratropium  0.5 mg Nebulization Once  . [COMPLETED] ipratropium  0.5 mg Nebulization Once  . ipratropium  0.5 mg Nebulization Q4H  . irbesartan  150 mg Oral Daily  . levofloxacin (LEVAQUIN) IV  750 mg Intravenous Q24H  . [COMPLETED] methylPREDNISolone (SOLU-MEDROL) injection  125 mg Intravenous Once  .  methylPREDNISolone (SOLU-MEDROL) injection  125 mg Intravenous Q6H  . pantoprazole  40 mg Oral BID AC  . piperacillin-tazobactam (ZOSYN)  IV  3.375 g Intravenous Q8H  . simvastatin  40 mg Oral QPM  . sodium chloride  3 mL Intravenous Q12H  . vancomycin  1,000 mg Intravenous Q8H  . [DISCONTINUED] albuterol  2.5 mg Nebulization Q6H  . [DISCONTINUED] doxycycline  100 mg Oral Q12H  . [DISCONTINUED] ipratropium  0.5 mg Nebulization Q6H  . [DISCONTINUED] methylPREDNISolone (SOLU-MEDROL) injection  60 mg Intravenous Q12H  . [DISCONTINUED] pantoprazole  40 mg Oral Daily   Assessment: Okay for Protocol Estimated Creatinine Clearance: 127 ml/min (by C-G formula based on Cr of 0.53). HCAP  Treatment with Levaquin, Vancomycin and Zosyn   Goal of Therapy:  Vancomycin trough level 15-20 mcg/ml  Plan:    Zosyn 3.375gm IV every 8 hours. Vancomycin 1000mg  IV every 8 hours. Measure antibiotic drug levels at steady state Follow up culture results  Mady Gemma 06/10/2012,11:01 AM

## 2012-06-10 NOTE — Progress Notes (Signed)
[facility]             Progress Note   Subjective: Breathing has improved since admission, but he is still very short of breath and is coughing.  Objective: Vital signs in last 24 hours: Temp:  [97.7 F (36.5 C)-98.1 F (36.7 C)] 97.7 F (36.5 C) (01/18 0545) Pulse Rate:  [97-117] 101  (01/18 0545) Resp:  [18-28] 21  (01/18 0545) BP: (155-170)/(93-135) 158/93 mmHg (01/18 0545) SpO2:  [63 %-100 %] 96 % (01/18 0709) FiO2 (%):  [100 %] 100 % (01/17 2119) Weight:  [99.791 kg (220 lb)] 99.791 kg (220 lb) (01/17 2108) Weight change:  Last BM Date: 06/09/12  Intake/Output from previous day:   Total I/O In: 236 [P.O.:236] Out: -    Physical Exam: General: Alert, awake, oriented x3, in no acute distress. HEENT: No bruits, no goiter. Heart: Regular rate and rhythm, without murmurs, rubs, gallops. Lungs: B/l exp wheezes Abdomen: Soft, nontender, nondistended, positive bowel sounds. Extremities: No clubbing cyanosis or edema with positive pedal pulses. Neuro: Grossly intact, nonfocal.    Lab Results: Basic Metabolic Panel:  [facility] 06/09/12 2123  NA 137  K 4.2  CL 102  CO2 24  GLUCOSE 107*  BUN 10  CREATININE 0.53  CALCIUM 9.1  MG --  PHOS --   Liver Function Tests:  [facility] 06/09/12 2123  AST 16  ALT 26  ALKPHOS 105  BILITOT 0.2*  PROT 6.8  ALBUMIN 3.3*   No results found for this basename: LIPASE:2,AMYLASE:2 in the last 72 hours No results found for this basename: AMMONIA:2 in the last 72 hours CBC:  Basename 06/09/12 2123  WBC 11.3*  NEUTROABS 7.1  HGB 12.0*  HCT 35.5*  MCV 91.3  PLT 236   Cardiac Enzymes:  Basename 06/09/12 2123  CKTOTAL --  CKMB --  CKMBINDEX --  TROPONINI <0.30   BNP:  Basename 06/09/12 2123  PROBNP 125.7*   D-Dimer: No results found for this basename: DDIMER:2 in the last 72 hours CBG:  Basename 06/10/12 0748  GLUCAP 140*   Hemoglobin A1C: No results found for this basename: HGBA1C in the  last 72 hours Fasting Lipid Panel: No results found for this basename: CHOL,HDL,LDLCALC,TRIG,CHOLHDL,LDLDIRECT in the last 72 hours Thyroid Function Tests: No results found for this basename: TSH,T4TOTAL,FREET4,T3FREE,THYROIDAB in the last 72 hours Anemia Panel: No results found for this basename: VITAMINB12,FOLATE,FERRITIN,TIBC,IRON,RETICCTPCT in the last 72 hours Coagulation: No results found for this basename: LABPROT:2,INR:2 in the last 72 hours Urine Drug Screen: Drugs of Abuse  No results found for this basename: labopia,  cocainscrnur,  labbenz,  amphetmu,  thcu,  labbarb    Alcohol Level: No results found for this basename: ETH:2 in the last 72 hours Urinalysis: No results found for this basename: COLORURINE:2,APPERANCEUR:2,LABSPEC:2,PHURINE:2,GLUCOSEU:2,HGBUR:2,BILIRUBINUR:2,KETONESUR:2,PROTEINUR:2,UROBILINOGEN:2,NITRITE:2,LEUKOCYTESUR:2 in the last 72 hours  No results found for this or any previous visit (from the past 240 hour(s)).  Studies/Results: Ct Chest W Contrast  06/10/2012  *RADIOLOGY REPORT*  Clinical Data: 57 year old male with shortness of breath and cough.  CT CHEST WITH CONTRAST  Technique:  Multidetector CT imaging of the chest was performed following the standard protocol during bolus administration of intravenous contrast.  Contrast: OMNIPAQUE IOHEXOL 300 MG/ML  SOLN  Comparison: 06/09/2012 and prior chest radiographs.  04/22/2007 chest CT  Findings: Upper limits of normal mediastinal lymph nodes are unchanged. The heart and great vessels are unchanged with moderate coronary artery calcifications. There are no pleural or pericardial effusions present. Mild circumferential distal esophageal  wall thickening may represent esophagitis.  Scattered areas of ground-glass opacities bilaterally are noted and have a somewhat mosaic type pattern, suggesting small airway disease. There is no evidence of consolidation, discrete mass or endobronchial/endotracheal lesion. A  slightly irregular opacity within the right upper lobe is unchanged and compatible with scar.  Fatty infiltration of the liver is again noted.  No acute or suspicious bony abnormalities are identified.  IMPRESSION: New scattered ground-glass opacities bilaterally suggestive of small airway disease. Small vessel disease or nonspecific inflammation/infection are considered less likely.  Distal esophageal wall thickening - unchanged and likely representing esophagitis.  Coronary artery disease.  Fatty infiltration of the liver.   Original Report Authenticated By: Harmon Pier, M.D.    Dg Chest Port 1 View  06/09/2012  *RADIOLOGY REPORT*  Clinical Data: Severe dyspnea.  PORTABLE CHEST - 1 VIEW  Comparison: 05/14/2012  Findings: Borderline heart size with normal pulmonary vascularity. Peribronchial thickening and perihilar infiltrates suggesting chronic bronchitis.  No developing focal consolidation.  Suggestion of bronchiectasis in the lung bases.  No pneumothorax.  Mediastinal contours appear intact.  IMPRESSION: Chronic bronchitic changes with possible bronchiectasis in the lungs.  No focal acute consolidation is identified.   Original Report Authenticated By: Burman Nieves, M.D.     Medications: Scheduled Meds:    . albuterol  2.5 mg Nebulization Q4H  . allopurinol  300 mg Oral q morning - 10a  . aspirin EC  81 mg Oral Daily  . carvedilol  25 mg Oral BID WC  . chlorpheniramine-HYDROcodone  5 mL Oral Q12H  . docusate sodium  100 mg Oral BID  . doxycycline  100 mg Oral Q12H  . enoxaparin (LOVENOX) injection  40 mg Subcutaneous Q24H  . guaiFENesin  1,200 mg Oral BID  . ipratropium  0.5 mg Nebulization Q4H  . irbesartan  150 mg Oral Daily  . levofloxacin (LEVAQUIN) IV  750 mg Intravenous Q24H  . methylPREDNISolone (SOLU-MEDROL) injection  125 mg Intravenous Q6H  . pantoprazole  40 mg Oral BID AC  . simvastatin  40 mg Oral QPM  . sodium chloride  3 mL Intravenous Q12H   Continuous Infusions:      . sodium chloride 50 mL/hr at 06/10/12 0247   PRN Meds:.acetaminophen, acetaminophen, albuterol, alum & mag hydroxide-simeth, chlorpheniramine-HYDROcodone, cyclobenzaprine, gi cocktail, ondansetron (ZOFRAN) IV, ondansetron, oxyCODONE  Assessment/Plan:  Principal Problem:  *COPD exacerbation Active Problems:  Hypertension  Pulmonary fibrosis  GERD (gastroesophageal reflux disease)  Plan:  1. Acute respiratory failure possibly due to copd exacerbation. Patient is on nebulizer treatments, steroids and antibiotics. He is still wheezing and coughing and will need to be continued on current treatments.  He does have ground glass opacities on Chest CT.  Pulmonology has been consulted.  2. GERD.  Patient has esophageal thickening on CT.  GI has been consulted and plans EGD on Monday if respiratory status has improved.  3. HTN.  stable  Time spent coordinating care:   LOS: 1 day   MEMON,JEHANZEB [facility] Pager: [phone] 06/10/2012, 10:46 AM

## 2012-06-10 NOTE — Consult Note (Signed)
Referring Provider: No ref. provider found Primary Care Physician:  DR. Clarise Cruz Primary Gastroenterologist:  Jonette Eva  Reason for Consultation:  Thickened esophagus on CT exam  HPI:  BEFORE December DID NOT HAVE BREATHING PROBLEMS. BURNS A WOOD HEATER FROM NOV-MAR.2012: NO WOOD BURNING .  LAST YEAR BURNED A LITTLE. SOMETIMES HE EATS AND FEELS LIKE BURNNING IN THE MIDDLE OF HIS CHEST. DRINKS SODA WATER AND GOES AWAY. SX FOR 2-3 YEARS EASY. NOT EVERY DAY OR EVERY MONTH. NEVER BEEN TOLD HE HAS HEARTBURN AND HASN'T BEEN TREATED FOR REFLUX.SUBJECTIVE CHILLS. NO CIGS. DIP SNUFF. NO ASA. DRINKS A BEER ONCE IN A WHILE. NOT ON AN INHALER BEFORE DEC. WHEN HE'S SLEEP, IF HE EATS TOO LATE, CAN HAVE THINGS BUBBLE BACK UP HIS ESOPHAGUS AND WAKES HIM UP/GETS IN HIS LUNGS. HAPPENING 1`-2X/MO FOR 2-3 YEARS AT LEAST. NO WEIGHT LOSS. APPETITE: GOOD. BREATHING IS BETTER SINCE ADMISSION.  PT DENIES FEVER, CHILLS, BRBPR, nausea, vomiting, melena, diarrhea, constipation, abd pain, problems with sedation.    Past Medical History  Diagnosis Date  . Hypertension   . Gout   . High cholesterol   . Asthma    PSH: R KNEE ARTHROPSCOPY   Prior to Admission medications   Medication Sig Start Date End Date Taking? Authorizing Provider  Acetaminophen (ARTHRITIS PAIN RELIEF PO) Take 1 tablet by mouth daily as needed. For arthritis pain   Yes Historical Provider, MD  albuterol (PROVENTIL HFA;VENTOLIN HFA) 108 (90 BASE) MCG/ACT inhaler Inhale 2 puffs into the lungs 3 (three) times daily. And every 4 hours as needed for wheezing, chest congestion, or shortness of breath. 05/17/12  Yes Elliot Cousin, MD  albuterol (PROVENTIL) (5 MG/ML) 0.5% nebulizer solution Take 2.5 mg by nebulization every 4 (four) hours as needed. For shortness of breath/wheezing   Yes Historical Provider, MD  allopurinol (ZYLOPRIM) 300 MG tablet Take 300 mg by mouth every morning.    Yes Historical Provider, MD  budesonide (PULMICORT) 0.5 MG/2ML  nebulizer solution Take 0.5 mg by nebulization 2 (two) times daily.   Yes Historical Provider, MD  carvedilol (COREG) 25 MG tablet Take 25 mg by mouth 2 (two) times daily with a meal.   Yes Historical Provider, MD  cyclobenzaprine (FLEXERIL) 10 MG tablet Take 10 mg by mouth 3 (three) times daily as needed. For spasms   Yes Historical Provider, MD  HYDROcodone-acetaminophen (NORCO) 10-325 MG per tablet Take 1 tablet by mouth every 6 (six) hours as needed. pain   Yes Historical Provider, MD  simvastatin (ZOCOR) 40 MG tablet Take 40 mg by mouth every evening.   Yes Historical Provider, MD  valsartan (DIOVAN) 160 MG tablet Take 160 mg by mouth daily.   Yes Historical Provider, MD   INPATIENT MED LIST REVIEWED.  Allergies as of 06/09/2012  . (No Known Allergies)    Family History:  Colon Cancer  negative                           Polyps  Negative NO FAMILY ESOPHAGEAL OR STOMACH CA.   History   Social History  . Marital Status: Single    Spouse Name: N/A    CHILDREN  1 DAUGHTER AND GRANDSON IN [place]    . Years of Education: N/A   Occupational History  . Not on file.   Social History Main Topics  . Smoking status: Former Smoker    Types: Cigarettes    Quit date: 05/16/1999  . Smokeless tobacco:  Not on file  . Alcohol Use: Yes  . Drug Use: No  . Sexually Active: Yes   Review of Systems:  CV: Denies chest pain. PER HPI OTHERWISE ALL SYSTEMS NEGATIVE   Vitals: Blood pressure 158/93, pulse 101, temperature 97.7 F (36.5 C), temperature source Oral, resp. rate 21, height 6\' 2"  (1.88 m), weight 220 lb (99.791 kg), SpO2 96.00%.  Physical Exam: General:   Alert,  Well-developed, well-nourished, pleasant and cooperative in NAD. ABLE TO COMPLETE FULL SENTENCES. Head:  Normocephalic and atraumatic. Eyes:  Sclera clear, no icterus.   Conjunctiva pink. Mouth:  No deformity or lesions Neck:  Supple;  Lungs:  FAIR AIR MOVEMENT BIL. END EXP WHEEZES BIL.   No acute distress. Heart:  RAPID rate, REGULAR rhythm; no murmurs Abdomen:  Soft, nontender and nondistended. Normal bowel sounds, without guarding, and without rebound.   Msk:  Symmetrical without gross deformities. Normal posture. Extremities:  With edema BIL LE. Neurologic:  Alert and  oriented x4;  grossly normal neurologically. Psych:  Alert and cooperative. Normal mood and affect.   Lab Results:  [facility] 06/09/12 2123  WBC 11.3*  HGB 12.0*  HCT 35.5*  PLT 236   BMET  Basename 06/09/12 2123  NA 137  K 4.2  CL 102  CO2 24  GLUCOSE 107*  BUN 10  CREATININE 0.53  CALCIUM 9.1   LFT  Basename 06/09/12 2123  PROT 6.8  ALBUMIN 3.3*  AST 16  ALT 26  ALKPHOS 105  BILITOT 0.2*  BILIDIR --  IBILI --     Studies/Results: JAN 18: CT OF CHEST: THICKENED ESOPHAGUS  Impression: CT CHEST-THICKENED ESOPHAGUS ON CT. DENIES DYSPHAGIA. C/O INTERMITTENT PAIN WITH SWALLOWING. SX MOST LIKELY DUE TO EROSIVE ESOPHAGITIS, LESS LIKELY ESOPHAGEAL CA.  Plan: 1. SOFT MECH/LOW FAT DIET 2. BID PPI 3. KEEP HOB > 45 DEGREES WHILE SLEEPING 4. EGD JAN 20 IF RESPIRATORY STATUS IMPROVES 5. TCS AS AN OP ONCE RESPIRATORY ISSUES WELL CONTROLLED   LOS: 1 day   [facility]  06/10/2012, 9:28 AM

## 2012-06-10 NOTE — Progress Notes (Signed)
Patient's blood pressure elevate. Dr. York Pellant notified.

## 2012-06-10 NOTE — Progress Notes (Signed)
Patient states he has had frequent episodes of reflux at night that have awakened him with fits of coughing during sleep

## 2012-06-11 DIAGNOSIS — J189 Pneumonia, unspecified organism: Secondary | ICD-10-CM | POA: Diagnosis present

## 2012-06-11 LAB — GLUCOSE, CAPILLARY
Glucose-Capillary: 160 mg/dL — ABNORMAL HIGH (ref 70–99)
Glucose-Capillary: 142 mg/dL — ABNORMAL HIGH (ref 70–99)

## 2012-06-11 LAB — CBC
Hemoglobin: 12.3 g/dL — ABNORMAL LOW (ref 13.0–17.0)
MCHC: 33.7 g/dL (ref 30.0–36.0)
RDW: 13 % (ref 11.5–15.5)

## 2012-06-11 MED ORDER — METHYLPREDNISOLONE SODIUM SUCC 125 MG IJ SOLR
60.0000 mg | Freq: Four times a day (QID) | INTRAMUSCULAR | Status: DC
  Administered 2012-06-11 – 2012-06-14 (×10): 60 mg via INTRAVENOUS
  Filled 2012-06-11 (×10): qty 2

## 2012-06-11 MED ORDER — LEVALBUTEROL HCL 0.63 MG/3ML IN NEBU
0.6300 mg | INHALATION_SOLUTION | Freq: Four times a day (QID) | RESPIRATORY_TRACT | Status: DC
  Administered 2012-06-11 – 2012-06-14 (×12): 0.63 mg via RESPIRATORY_TRACT
  Filled 2012-06-11 (×11): qty 3

## 2012-06-11 MED ORDER — INSULIN ASPART 100 UNIT/ML ~~LOC~~ SOLN: 0.0000 [IU] | Freq: Every day | SUBCUTANEOUS | Status: DC

## 2012-06-11 MED ORDER — INSULIN ASPART 100 UNIT/ML ~~LOC~~ SOLN
0.0000 [IU] | Freq: Three times a day (TID) | SUBCUTANEOUS | Status: DC
  Administered 2012-06-11: 3 [IU] via SUBCUTANEOUS
  Administered 2012-06-11 – 2012-06-12 (×2): 2 [IU] via SUBCUTANEOUS
  Administered 2012-06-12: 3 [IU] via SUBCUTANEOUS
  Administered 2012-06-13 – 2012-06-14 (×4): 2 [IU] via SUBCUTANEOUS

## 2012-06-11 MED ORDER — IPRATROPIUM BROMIDE 0.02 % IN SOLN
0.5000 mg | Freq: Four times a day (QID) | RESPIRATORY_TRACT | Status: DC
  Administered 2012-06-11 – 2012-06-14 (×12): 0.5 mg via RESPIRATORY_TRACT
  Filled 2012-06-11 (×11): qty 2.5

## 2012-06-11 MED ORDER — ENOXAPARIN SODIUM 40 MG/0.4ML ~~LOC~~ SOLN
40.0000 mg | SUBCUTANEOUS | Status: DC
  Administered 2012-06-13 – 2012-06-14 (×2): 40 mg via SUBCUTANEOUS
  Filled 2012-06-11 (×2): qty 0.4

## 2012-06-11 NOTE — Progress Notes (Signed)
Subjective: Since I last evaluated the patient HE HAD BURNING WITH SWALLOWING THIS AM. NO VOMITING OR NAUSEA. STEROIDS ARE MAKING HIM JITTERY AND HE IS CONCERNED ABOUT RETAINING FLUID.  Objective: Vital signs in last 24 hours: Temp:  [97 F (36.1 C)-98.7 F (37.1 C)] 97.9 F (36.6 C) (01/19 0704) Pulse Rate:  [98-109] 99  (01/19 0704) Resp:  [20-22] 20  (01/19 0704) BP: (145-194)/(83-116) 145/87 mmHg (01/19 0704) SpO2:  [91 %-100 %] 92 % (01/19 0711) Last BM Date: 06/09/12  Intake/Output from previous day: 01/18 0701 - 01/19 0700 In: 2576.8 [P.O.:716; I.V.:960.8; IV Piggyback:900] Out: 200 [Urine:200] Intake/Output this shift: Total I/O In: 480 [P.O.:480] Out: -   General appearance: alert, cooperative and no distress Resp: wheezes anterior - bilateral Cardio: regular rate and rhythm GI: soft, non-tender; bowel sounds normal; no masses,  no organomegaly  Lab Results:  [facility] 06/11/12 0657 06/09/12 2123  WBC 16.6* 11.3*  HGB 12.3* 12.0*  HCT 36.5* 35.5*  PLT 297 236   BMET  Basename 06/09/12 2123  NA 137  K 4.2  CL 102  CO2 24  GLUCOSE 107*  BUN 10  CREATININE 0.53  CALCIUM 9.1   LFT  Basename 06/09/12 2123  PROT 6.8  ALBUMIN 3.3*  AST 16  ALT 26  ALKPHOS 105  BILITOT 0.2*  BILIDIR --  IBILI --   PT/INR No results found for this basename: LABPROT:2,INR:2 in the last 72 hours Hepatitis Panel No results found for this basename: HEPBSAG,HCVAB,HEPAIGM,HEPBIGM in the last 72 hours C-Diff No results found for this basename: CDIFFTOX:3 in the last 72 hours Fecal Lactopherrin No results found for this basename: FECLLACTOFRN in the last 72 hours  Studies/Results: Ct Chest W Contrast  06/10/2012  *RADIOLOGY REPORT*  Clinical Data: 57 year old male with shortness of breath and cough.  CT CHEST WITH CONTRAST  Technique:  Multidetector CT imaging of the chest was performed following the standard protocol during bolus administration of intravenous contrast.   Contrast: OMNIPAQUE IOHEXOL 300 MG/ML  SOLN  Comparison: 06/09/2012 and prior chest radiographs.  04/22/2007 chest CT  Findings: Upper limits of normal mediastinal lymph nodes are unchanged. The heart and great vessels are unchanged with moderate coronary artery calcifications. There are no pleural or pericardial effusions present. Mild circumferential distal esophageal wall thickening may represent esophagitis.  Scattered areas of ground-glass opacities bilaterally are noted and have a somewhat mosaic type pattern, suggesting small airway disease. There is no evidence of consolidation, discrete mass or endobronchial/endotracheal lesion. A slightly irregular opacity within the right upper lobe is unchanged and compatible with scar.  Fatty infiltration of the liver is again noted.  No acute or suspicious bony abnormalities are identified.  IMPRESSION: New scattered ground-glass opacities bilaterally suggestive of small airway disease. Small vessel disease or nonspecific inflammation/infection are considered less likely.  Distal esophageal wall thickening - unchanged and likely representing esophagitis.  Coronary artery disease.  Fatty infiltration of the liver.   Original Report Authenticated By: Harmon Pier, M.D.    Dg Chest Port 1 View  06/09/2012  *RADIOLOGY REPORT*  Clinical Data: Severe dyspnea.  PORTABLE CHEST - 1 VIEW  Comparison: 05/14/2012  Findings: Borderline heart size with normal pulmonary vascularity. Peribronchial thickening and perihilar infiltrates suggesting chronic bronchitis.  No developing focal consolidation.  Suggestion of bronchiectasis in the lung bases.  No pneumothorax.  Mediastinal contours appear intact.  IMPRESSION: Chronic bronchitic changes with possible bronchiectasis in the lungs.  No focal acute consolidation is identified.   Original  Report Authenticated By: Burman Nieves, M.D.     Medications: I have reviewed the patient's current  medications.  Assessment/Plan: ODYNOPHAGIA ETILOGY UNCLEAR AND SELF REPORTED HX: GERD WITH OCCASION NOCTURNAL REGURGITATION   PLAN: 1. EGD JAN 20 2. FULL LIQUIDS AFTER 6 OM. NPO AFTER MN 3. HOLD LOVENOX 4. BID PP 5. LOW FAT DIET. PT GIVEN HO ON LIFESTYLE REMEDIES TO HELP CONTROL REFLUX.  LOS: 2 days   [facility] 06/11/2012, 10:37 AM

## 2012-06-11 NOTE — Plan of Care (Signed)
Discussed with Dr. Juanetta Gosling this monring that the patient was very anxious/restless this morning.  Voiced to him my concerns about the pt being on Albuterol nebulizer's I suggested we change to xopenx.  MD agrees new orders given and followed.    Also discussed with Dr. Kerry Hough about the patient have CBG's of 140 and 160. Also that the patient is having pittable edema to his lower extremities worst in one more than the other. I recommended that pt start sliding scale insulin, and MD stated he wanted the IV to be saline locked.   The patient was given the document that r Fields placed in her plan of care for the patient. I left a message on Florian Buff scheduler for OR for the pt to be scheduled for a EGD I also notified Glorianne Manchester, Rn.

## 2012-06-11 NOTE — Plan of Care (Signed)
    Lifestyle and home remedies TO HELP CONTROL YOUR REFLUX  You may eliminate or reduce the frequency of heartburn by making the following lifestyle changes:    Control your weight. Being overweight is a major risk factor for heartburn and GERD. Excess pounds put pressure on your abdomen, pushing up your stomach and causing acid to back up into your esophagus.     Eat smaller meals. 4 TO 6 MEALS A DAY. This reduces pressure on the lower esophageal sphincter, helping to prevent the valve from opening and acid from washing back into your esophagus.     Loosen your belt. Clothes that fit tightly around your waist put pressure on your abdomen and the lower esophageal sphincter.    Eliminate heartburn triggers. Everyone has specific triggers.     Common triggers such as fatty or fried foods, spicy food, tomato sauce, carbonated beverages, alcohol, chocolate, mint, garlic, onion, caffeine and nicotine may make heartburn worse.     Avoid stooping or bending. Tying your shoes is OK. Bending over for longer periods to weed your garden isn't, especially soon after eating.     Don't lie down after a meal. Wait at least three to four hours after eating before going to bed, and don't lie down right after eating.    Place the head of your bed on 6 in blocks  Alternative medicine   Several home remedies exist for treating GERD, but they provide only temporary relief. They include drinking baking soda (sodium bicarbonate) added to water or drinking other fluids such as baking soda mixed with cream of tartar and water.   Although these liquids create temporary relief by neutralizing, washing away or buffering acids, eventually they aggravate the situation by adding gas and fluid to your stomach, increasing pressure and causing more acid reflux. Further, adding more sodium to your diet may increase your blood pressure and add stress to your heart, and excessive bicarbonate ingestion can alter the acid-base  balance in your body.

## 2012-06-11 NOTE — Progress Notes (Signed)
Subjective: He says he feels a little bit better. He describes episodes of aspiration but he has not had any in the hospital. He is scheduled for EGD tomorrow assuming that he does well. He is coughing less and seems less short of breath  Objective: Vital signs in last 24 hours: Temp:  [97 F (36.1 C)-98.7 F (37.1 C)] 97.9 F (36.6 C) (01/19 0704) Pulse Rate:  [98-109] 99  (01/19 0704) Resp:  [20-22] 20  (01/19 0704) BP: (145-194)/(83-116) 145/87 mmHg (01/19 0704) SpO2:  [91 %-100 %] 92 % (01/19 0711) Weight change:  Last BM Date: 06/09/12  Intake/Output from previous day: 01/18 0701 - 01/19 0700 In: 2576.8 [P.O.:716; I.V.:960.8; IV Piggyback:900] Out: 200 [Urine:200]  PHYSICAL EXAM General appearance: alert, cooperative and no distress Resp: rhonchi bilaterally Cardio: regular rate and rhythm, S1, S2 normal, no murmur, click, rub or gallop GI: soft, non-tender; bowel sounds normal; no masses,  no organomegaly Extremities: extremities normal, atraumatic, no cyanosis or edema  Lab Results:    Basic Metabolic Panel:  Basename 06/09/12 2123  NA 137  K 4.2  CL 102  CO2 24  GLUCOSE 107*  BUN 10  CREATININE 0.53  CALCIUM 9.1  MG --  PHOS --   Liver Function Tests:  [facility] 06/09/12 2123  AST 16  ALT 26  ALKPHOS 105  BILITOT 0.2*  PROT 6.8  ALBUMIN 3.3*   No results found for this basename: LIPASE:2,AMYLASE:2 in the last 72 hours No results found for this basename: AMMONIA:2 in the last 72 hours CBC:  Basename 06/11/12 0657 06/09/12 2123  WBC 16.6* 11.3*  NEUTROABS -- 7.1  HGB 12.3* 12.0*  HCT 36.5* 35.5*  MCV 91.7 91.3  PLT 297 236   Cardiac Enzymes:  Basename 06/09/12 2123  CKTOTAL --  CKMB --  CKMBINDEX --  TROPONINI <0.30   BNP:  Basename 06/09/12 2123  PROBNP 125.7*   D-Dimer: No results found for this basename: DDIMER:2 in the last 72 hours CBG:  Basename 06/11/12 0737 06/10/12 0748  GLUCAP 160* 140*   Hemoglobin A1C: No  results found for this basename: HGBA1C in the last 72 hours Fasting Lipid Panel: No results found for this basename: CHOL,HDL,LDLCALC,TRIG,CHOLHDL,LDLDIRECT in the last 72 hours Thyroid Function Tests: No results found for this basename: TSH,T4TOTAL,FREET4,T3FREE,THYROIDAB in the last 72 hours Anemia Panel: No results found for this basename: VITAMINB12,FOLATE,FERRITIN,TIBC,IRON,RETICCTPCT in the last 72 hours Coagulation: No results found for this basename: LABPROT:2,INR:2 in the last 72 hours Urine Drug Screen: Drugs of Abuse  No results found for this basename: labopia, cocainscrnur, labbenz, amphetmu, thcu, labbarb    Alcohol Level: No results found for this basename: ETH:2 in the last 72 hours Urinalysis: No results found for this basename: COLORURINE:2,APPERANCEUR:2,LABSPEC:2,PHURINE:2,GLUCOSEU:2,HGBUR:2,BILIRUBINUR:2,KETONESUR:2,PROTEINUR:2,UROBILINOGEN:2,NITRITE:2,LEUKOCYTESUR:2 in the last 72 hours Misc. Labs:  ABGS No results found for this basename: PHART,PCO2,PO2ART,TCO2,HCO3 in the last 72 hours CULTURES No results found for this or any previous visit (from the past 240 hour(s)). Studies/Results: Ct Chest W Contrast  06/10/2012  *RADIOLOGY REPORT*  Clinical Data: 57 year old male with shortness of breath and cough.  CT CHEST WITH CONTRAST  Technique:  Multidetector CT imaging of the chest was performed following the standard protocol during bolus administration of intravenous contrast.  Contrast: OMNIPAQUE IOHEXOL 300 MG/ML  SOLN  Comparison: 06/09/2012 and prior chest radiographs.  04/22/2007 chest CT  Findings: Upper limits of normal mediastinal lymph nodes are unchanged. The heart and great vessels are unchanged with moderate coronary artery calcifications. There are no pleural  or pericardial effusions present. Mild circumferential distal esophageal wall thickening may represent esophagitis.  Scattered areas of ground-glass opacities bilaterally are noted and have a  somewhat mosaic type pattern, suggesting small airway disease. There is no evidence of consolidation, discrete mass or endobronchial/endotracheal lesion. A slightly irregular opacity within the right upper lobe is unchanged and compatible with scar.  Fatty infiltration of the liver is again noted.  No acute or suspicious bony abnormalities are identified.  IMPRESSION: New scattered ground-glass opacities bilaterally suggestive of small airway disease. Small vessel disease or nonspecific inflammation/infection are considered less likely.  Distal esophageal wall thickening - unchanged and likely representing esophagitis.  Coronary artery disease.  Fatty infiltration of the liver.   Original Report Authenticated By: Harmon Pier, M.D.    Dg Chest Port 1 View  06/09/2012  *RADIOLOGY REPORT*  Clinical Data: Severe dyspnea.  PORTABLE CHEST - 1 VIEW  Comparison: 05/14/2012  Findings: Borderline heart size with normal pulmonary vascularity. Peribronchial thickening and perihilar infiltrates suggesting chronic bronchitis.  No developing focal consolidation.  Suggestion of bronchiectasis in the lung bases.  No pneumothorax.  Mediastinal contours appear intact.  IMPRESSION: Chronic bronchitic changes with possible bronchiectasis in the lungs.  No focal acute consolidation is identified.   Original Report Authenticated By: Burman Nieves, M.D.     Medications:  Scheduled:   . allopurinol  300 mg Oral q morning - 10a  . aspirin EC  81 mg Oral Daily  . carvedilol  25 mg Oral BID WC  . chlorpheniramine-HYDROcodone  5 mL Oral Q12H  . cloNIDine  0.2 mg Oral BID  . docusate sodium  100 mg Oral BID  . enoxaparin (LOVENOX) injection  40 mg Subcutaneous Q24H  . guaiFENesin  1,200 mg Oral BID  . ipratropium  0.5 mg Nebulization Q6H  . irbesartan  300 mg Oral Daily  . levalbuterol  0.63 mg Nebulization Q6H  . levofloxacin (LEVAQUIN) IV  750 mg Intravenous Q24H  . methylPREDNISolone (SOLU-MEDROL) injection  125 mg  Intravenous Q6H  . pantoprazole  40 mg Oral BID AC  . piperacillin-tazobactam (ZOSYN)  IV  3.375 g Intravenous Q8H  . simvastatin  40 mg Oral QPM  . sodium chloride  3 mL Intravenous Q12H  . vancomycin  1,000 mg Intravenous Q8H   Continuous:   . sodium chloride 50 mL/hr at 06/11/12 0402   ZOX:[id number], acetaminophen, albuterol, alum & mag hydroxide-simeth, chlorpheniramine-HYDROcodone, cyclobenzaprine, gi cocktail, ondansetron (ZOFRAN) IV, ondansetron, oxyCODONE  Assesment: I think he may have aspirated and that could be producing the groundglass abnormality on his chest CT. He does have what seems to be COPD. He has abnormalities of his esophagus on CT. He has been tachycardic which may be related to his albuterol treatments Principal Problem:  *COPD exacerbation Active Problems:  Hypertension  Pulmonary fibrosis  GERD (gastroesophageal reflux disease)    Plan: Continue with antibiotic coverage. He may need a speech evaluation. I will switch him to Xopenex    LOS: 2 days   Rebie Peale L 06/11/2012, 10:07 AM

## 2012-06-11 NOTE — Progress Notes (Signed)
[facility]             Progress Note   Subjective: Breathing is improving.  Feels that he may be mobilizing more sputum. Still coughing.  Objective: Vital signs in last 24 hours: Temp:  [97 F (36.1 C)-98.7 F (37.1 C)] 97.8 F (36.6 C) (01/19 1335) Pulse Rate:  [98-109] 101  (01/19 1335) Resp:  [20-22] 20  (01/19 1335) BP: (145-194)/(82-116) 145/82 mmHg (01/19 1335) SpO2:  [91 %-100 %] 97 % (01/19 1335) Weight change:  Last BM Date: 06/10/12  Intake/Output from previous day: 01/18 0701 - 01/19 0700 In: 2576.8 [P.O.:716; I.V.:960.8; IV Piggyback:900] Out: 200 [Urine:200] Total I/O In: 480 [P.O.:480] Out: -    Physical Exam: General: Alert, awake, oriented x3, in no acute distress. HEENT: No bruits, no goiter. Heart: Regular rate and rhythm, without murmurs, rubs, gallops. Lungs: B/l exp wheezes, improved from yesterday Abdomen: Soft, nontender, nondistended, positive bowel sounds. Extremities: No clubbing cyanosis or edema with positive pedal pulses. Neuro: Grossly intact, nonfocal.    Lab Results: Basic Metabolic Panel:  [facility] 06/09/12 2123  NA 137  K 4.2  CL 102  CO2 24  GLUCOSE 107*  BUN 10  CREATININE 0.53  CALCIUM 9.1  MG --  PHOS --   Liver Function Tests:  [facility] 06/09/12 2123  AST 16  ALT 26  ALKPHOS 105  BILITOT 0.2*  PROT 6.8  ALBUMIN 3.3*   No results found for this basename: LIPASE:2,AMYLASE:2 in the last 72 hours No results found for this basename: AMMONIA:2 in the last 72 hours CBC:  Basename 06/11/12 0657 06/09/12 2123  WBC 16.6* 11.3*  NEUTROABS -- 7.1  HGB 12.3* 12.0*  HCT 36.5* 35.5*  MCV 91.7 91.3  PLT 297 236   Cardiac Enzymes:  Basename 06/09/12 2123  CKTOTAL --  CKMB --  CKMBINDEX --  TROPONINI <0.30   BNP:  Basename 06/09/12 2123  PROBNP 125.7*   D-Dimer: No results found for this basename: DDIMER:2 in the last 72 hours CBG:  Basename 06/11/12 1139 06/11/12 0737 06/10/12 0748    GLUCAP 142* 160* 140*   Hemoglobin A1C: No results found for this basename: HGBA1C in the last 72 hours Fasting Lipid Panel: No results found for this basename: CHOL,HDL,LDLCALC,TRIG,CHOLHDL,LDLDIRECT in the last 72 hours Thyroid Function Tests: No results found for this basename: TSH,T4TOTAL,FREET4,T3FREE,THYROIDAB in the last 72 hours Anemia Panel: No results found for this basename: VITAMINB12,FOLATE,FERRITIN,TIBC,IRON,RETICCTPCT in the last 72 hours Coagulation: No results found for this basename: LABPROT:2,INR:2 in the last 72 hours Urine Drug Screen: Drugs of Abuse  No results found for this basename: labopia,  cocainscrnur,  labbenz,  amphetmu,  thcu,  labbarb    Alcohol Level: No results found for this basename: ETH:2 in the last 72 hours Urinalysis: No results found for this basename: COLORURINE:2,APPERANCEUR:2,LABSPEC:2,PHURINE:2,GLUCOSEU:2,HGBUR:2,BILIRUBINUR:2,KETONESUR:2,PROTEINUR:2,UROBILINOGEN:2,NITRITE:2,LEUKOCYTESUR:2 in the last 72 hours  No results found for this or any previous visit (from the past 240 hour(s)).  Studies/Results: Ct Chest W Contrast  06/10/2012  *RADIOLOGY REPORT*  Clinical Data: 57 year old male with shortness of breath and cough.  CT CHEST WITH CONTRAST  Technique:  Multidetector CT imaging of the chest was performed following the standard protocol during bolus administration of intravenous contrast.  Contrast: OMNIPAQUE IOHEXOL 300 MG/ML  SOLN  Comparison: 06/09/2012 and prior chest radiographs.  04/22/2007 chest CT  Findings: Upper limits of normal mediastinal lymph nodes are unchanged. The heart and great vessels are unchanged with moderate coronary artery calcifications. There are no pleural or  pericardial effusions present. Mild circumferential distal esophageal wall thickening may represent esophagitis.  Scattered areas of ground-glass opacities bilaterally are noted and have a somewhat mosaic type pattern, suggesting small airway disease.  There is no evidence of consolidation, discrete mass or endobronchial/endotracheal lesion. A slightly irregular opacity within the right upper lobe is unchanged and compatible with scar.  Fatty infiltration of the liver is again noted.  No acute or suspicious bony abnormalities are identified.  IMPRESSION: New scattered ground-glass opacities bilaterally suggestive of small airway disease. Small vessel disease or nonspecific inflammation/infection are considered less likely.  Distal esophageal wall thickening - unchanged and likely representing esophagitis.  Coronary artery disease.  Fatty infiltration of the liver.   Original Report Authenticated By: Harmon Pier, M.D.    Dg Chest Port 1 View  06/09/2012  *RADIOLOGY REPORT*  Clinical Data: Severe dyspnea.  PORTABLE CHEST - 1 VIEW  Comparison: 05/14/2012  Findings: Borderline heart size with normal pulmonary vascularity. Peribronchial thickening and perihilar infiltrates suggesting chronic bronchitis.  No developing focal consolidation.  Suggestion of bronchiectasis in the lung bases.  No pneumothorax.  Mediastinal contours appear intact.  IMPRESSION: Chronic bronchitic changes with possible bronchiectasis in the lungs.  No focal acute consolidation is identified.   Original Report Authenticated By: Burman Nieves, M.D.     Medications: Scheduled Meds:    . allopurinol  300 mg Oral q morning - 10a  . aspirin EC  81 mg Oral Daily  . carvedilol  25 mg Oral BID WC  . chlorpheniramine-HYDROcodone  5 mL Oral Q12H  . cloNIDine  0.2 mg Oral BID  . docusate sodium  100 mg Oral BID  . enoxaparin (LOVENOX) injection  40 mg Subcutaneous Q24H  . guaiFENesin  1,200 mg Oral BID  . insulin aspart  0-15 Units Subcutaneous TID WC  . insulin aspart  0-5 Units Subcutaneous QHS  . ipratropium  0.5 mg Nebulization Q6H  . irbesartan  300 mg Oral Daily  . levalbuterol  0.63 mg Nebulization Q6H  . levofloxacin (LEVAQUIN) IV  750 mg Intravenous Q24H  .  methylPREDNISolone (SOLU-MEDROL) injection  125 mg Intravenous Q6H  . pantoprazole  40 mg Oral BID AC  . piperacillin-tazobactam (ZOSYN)  IV  3.375 g Intravenous Q8H  . simvastatin  40 mg Oral QPM  . sodium chloride  3 mL Intravenous Q12H  . vancomycin  1,000 mg Intravenous Q8H   Continuous Infusions:   PRN Meds:.acetaminophen, acetaminophen, albuterol, alum & mag hydroxide-simeth, chlorpheniramine-HYDROcodone, cyclobenzaprine, gi cocktail, ondansetron (ZOFRAN) IV, ondansetron, oxyCODONE  Assessment/Plan:  Principal Problem:  *COPD exacerbation Active Problems:  Hypertension  GERD (gastroesophageal reflux disease)  HCAP (healthcare-associated pneumonia)  Plan:  1. Acute respiratory failure possibly due to copd exacerbation. Patient is on nebulizer treatments, steroids and antibiotics. Wheezing appears to be improving. Will decrease steroid dosing.  Continue other treatments. BNP elevated, Echo pending.  2. Pneumonia. Patient has been started on IV antibiotics.  Appreciate Pulmonology assistance.  Continue current treatments.  3. GERD.  Patient has esophageal thickening on CT.  GI has been consulted and plans EGD tomorrow.  3. HTN.  stable  Time spent coordinating care:   LOS: 2 days   MEMON,JEHANZEB [facility] Pager: [phone] 06/11/2012, 2:25 PM

## 2012-06-12 ENCOUNTER — Encounter (HOSPITAL_COMMUNITY)
Admission: EM | Disposition: A | Discharge status: Home / Self Care | Payer: Self-pay | Source: Home / Self Care | Attending: Internal Medicine

## 2012-06-12 ENCOUNTER — Encounter (HOSPITAL_COMMUNITY): Payer: Self-pay | Admitting: *Deleted

## 2012-06-12 DIAGNOSIS — K259 Gastric ulcer, unspecified as acute or chronic, without hemorrhage or perforation: Secondary | ICD-10-CM

## 2012-06-12 DIAGNOSIS — K222 Esophageal obstruction: Secondary | ICD-10-CM

## 2012-06-12 DIAGNOSIS — R1310 Dysphagia, unspecified: Secondary | ICD-10-CM

## 2012-06-12 DIAGNOSIS — K2970 Gastritis, unspecified, without bleeding: Secondary | ICD-10-CM

## 2012-06-12 DIAGNOSIS — K2990 Gastroduodenitis, unspecified, without bleeding: Secondary | ICD-10-CM

## 2012-06-12 DIAGNOSIS — I517 Cardiomegaly: Secondary | ICD-10-CM

## 2012-06-12 DIAGNOSIS — J189 Pneumonia, unspecified organism: Principal | ICD-10-CM

## 2012-06-12 HISTORY — PX: ESOPHAGOGASTRODUODENOSCOPY: SHX5428

## 2012-06-12 LAB — BASIC METABOLIC PANEL
BUN: 15 mg/dL (ref 6–23)
CO2: 26 mEq/L (ref 19–32)
Chloride: 106 mEq/L (ref 96–112)
GFR calc Af Amer: >90 mL/min (ref >90)
Glucose, Bld: 142 mg/dL — ABNORMAL HIGH (ref 70–99)
Potassium: 3.7 mEq/L (ref 3.5–5.1)

## 2012-06-12 LAB — KOH PREP

## 2012-06-12 LAB — GLUCOSE, CAPILLARY

## 2012-06-12 SURGERY — EGD (ESOPHAGOGASTRODUODENOSCOPY)
Anesthesia: Moderate Sedation

## 2012-06-12 MED ORDER — ALBUTEROL SULFATE (5 MG/ML) 0.5% IN NEBU
2.5000 mg | INHALATION_SOLUTION | Freq: Once | RESPIRATORY_TRACT | Status: AC
  Administered 2012-06-12: 2.5 mg via RESPIRATORY_TRACT

## 2012-06-12 MED ORDER — PROMETHAZINE HCL 25 MG/ML IJ SOLN
INTRAMUSCULAR | Status: AC
  Filled 2012-06-12: qty 1

## 2012-06-12 MED ORDER — BUTAMBEN-TETRACAINE-BENZOCAINE 2-2-14 % EX AERO
INHALATION_SPRAY | CUTANEOUS | Status: DC | PRN
  Administered 2012-06-12: 2 via TOPICAL

## 2012-06-12 MED ORDER — PROMETHAZINE HCL 25 MG/ML IJ SOLN
12.5000 mg | Freq: Once | INTRAMUSCULAR | Status: AC
  Administered 2012-06-12: 12.5 mg via INTRAVENOUS

## 2012-06-12 MED ORDER — SODIUM CHLORIDE 0.45 % IV SOLN
INTRAVENOUS | Status: DC
  Administered 2012-06-12: 11:00:00 via INTRAVENOUS

## 2012-06-12 MED ORDER — VANCOMYCIN HCL 10 G IV SOLR
1250.0000 mg | Freq: Three times a day (TID) | INTRAVENOUS | Status: DC
  Administered 2012-06-12 – 2012-06-13 (×3): 1250 mg via INTRAVENOUS
  Filled 2012-06-12 (×7): qty 1250

## 2012-06-12 MED ORDER — FLUCONAZOLE 100 MG PO TABS
100.0000 mg | ORAL_TABLET | Freq: Every day | ORAL | Status: DC
  Administered 2012-06-13 – 2012-06-14 (×2): 100 mg via ORAL
  Filled 2012-06-12 (×2): qty 1

## 2012-06-12 MED ORDER — MEPERIDINE HCL 100 MG/ML IJ SOLN
INTRAMUSCULAR | Status: AC
  Filled 2012-06-12: qty 2

## 2012-06-12 MED ORDER — SODIUM CHLORIDE 0.9 % IN NEBU
INHALATION_SOLUTION | RESPIRATORY_TRACT | Status: AC
  Filled 2012-06-12: qty 3

## 2012-06-12 MED ORDER — SODIUM CHLORIDE 0.9 % IV SOLN: INTRAVENOUS | Status: DC

## 2012-06-12 MED ORDER — STERILE WATER FOR IRRIGATION IR SOLN
Status: DC | PRN
  Administered 2012-06-12: 12:00:00

## 2012-06-12 MED ORDER — MIDAZOLAM HCL 5 MG/5ML IJ SOLN
INTRAMUSCULAR | Status: AC
  Filled 2012-06-12: qty 10

## 2012-06-12 MED ORDER — MIDAZOLAM HCL 5 MG/5ML IJ SOLN
INTRAMUSCULAR | Status: DC | PRN
  Administered 2012-06-12: 1 mg via INTRAVENOUS
  Administered 2012-06-12 (×2): 2 mg via INTRAVENOUS
  Administered 2012-06-12: 1 mg via INTRAVENOUS

## 2012-06-12 MED ORDER — MEPERIDINE HCL 100 MG/ML IJ SOLN
INTRAMUSCULAR | Status: DC | PRN
  Administered 2012-06-12: 25 mg via INTRAVENOUS
  Administered 2012-06-12: 50 mg via INTRAVENOUS
  Administered 2012-06-12: 25 mg via INTRAVENOUS

## 2012-06-12 MED ORDER — ALBUTEROL SULFATE (5 MG/ML) 0.5% IN NEBU
INHALATION_SOLUTION | RESPIRATORY_TRACT | Status: AC
  Filled 2012-06-12: qty 0.5

## 2012-06-12 MED ORDER — SODIUM CHLORIDE 0.9 % IJ SOLN
INTRAMUSCULAR | Status: AC
  Filled 2012-06-12: qty 10

## 2012-06-12 MED ORDER — FLUCONAZOLE 100 MG PO TABS
200.0000 mg | ORAL_TABLET | Freq: Once | ORAL | Status: AC
  Administered 2012-06-12: 200 mg via ORAL
  Filled 2012-06-12: qty 2

## 2012-06-12 NOTE — H&P (View-Only) (Signed)
Subjective: Since I last evaluated the patient HE HAD BURNING WITH SWALLOWING THIS AM. NO VOMITING OR NAUSEA. STEROIDS ARE MAKING HIM JITTERY AND HE IS CONCERNED ABOUT RETAINING FLUID.  Objective: Vital signs in last 24 hours: Temp:  [97 F (36.1 C)-98.7 F (37.1 C)] 97.9 F (36.6 C) (01/19 0704) Pulse Rate:  [98-109] 99  (01/19 0704) Resp:  [20-22] 20  (01/19 0704) BP: (145-194)/(83-116) 145/87 mmHg (01/19 0704) SpO2:  [91 %-100 %] 92 % (01/19 0711) Last BM Date: 06/09/12  Intake/Output from previous day: 01/18 0701 - 01/19 0700 In: 2576.8 [P.O.:716; I.V.:960.8; IV Piggyback:900] Out: 200 [Urine:200] Intake/Output this shift: Total I/O In: 480 [P.O.:480] Out: -   General appearance: alert, cooperative and no distress Resp: wheezes anterior - bilateral Cardio: regular rate and rhythm GI: soft, non-tender; bowel sounds normal; no masses,  no organomegaly  Lab Results:  Basename 06/11/12 0657 06/09/12 2123  WBC 16.6* 11.3*  HGB 12.3* 12.0*  HCT 36.5* 35.5*  PLT 297 236   BMET  Basename 06/09/12 2123  NA 137  K 4.2  CL 102  CO2 24  GLUCOSE 107*  BUN 10  CREATININE 0.53  CALCIUM 9.1   LFT  Basename 06/09/12 2123  PROT 6.8  ALBUMIN 3.3*  AST 16  ALT 26  ALKPHOS 105  BILITOT 0.2*  BILIDIR --  IBILI --   PT/INR No results found for this basename: LABPROT:2,INR:2 in the last 72 hours Hepatitis Panel No results found for this basename: HEPBSAG,HCVAB,HEPAIGM,HEPBIGM in the last 72 hours C-Diff No results found for this basename: CDIFFTOX:3 in the last 72 hours Fecal Lactopherrin No results found for this basename: FECLLACTOFRN in the last 72 hours  Studies/Results: Ct Chest W Contrast  06/10/2012  *RADIOLOGY REPORT*  Clinical Data: 56-year-old male with shortness of breath and cough.  CT CHEST WITH CONTRAST  Technique:  Multidetector CT imaging of the chest was performed following the standard protocol during bolus administration of intravenous contrast.   Contrast: 100mL OMNIPAQUE IOHEXOL 300 MG/ML  SOLN  Comparison: 06/09/2012 and prior chest radiographs.  04/22/2007 chest CT  Findings: Upper limits of normal mediastinal lymph nodes are unchanged. The heart and great vessels are unchanged with moderate coronary artery calcifications. There are no pleural or pericardial effusions present. Mild circumferential distal esophageal wall thickening may represent esophagitis.  Scattered areas of ground-glass opacities bilaterally are noted and have a somewhat mosaic type pattern, suggesting small airway disease. There is no evidence of consolidation, discrete mass or endobronchial/endotracheal lesion. A slightly irregular opacity within the right upper lobe is unchanged and compatible with scar.  Fatty infiltration of the liver is again noted.  No acute or suspicious bony abnormalities are identified.  IMPRESSION: New scattered ground-glass opacities bilaterally suggestive of small airway disease. Small vessel disease or nonspecific inflammation/infection are considered less likely.  Distal esophageal wall thickening - unchanged and likely representing esophagitis.  Coronary artery disease.  Fatty infiltration of the liver.   Original Report Authenticated By: Jeffrey Hu, M.D.    Dg Chest Port 1 View  06/09/2012  *RADIOLOGY REPORT*  Clinical Data: Severe dyspnea.  PORTABLE CHEST - 1 VIEW  Comparison: 05/14/2012  Findings: Borderline heart size with normal pulmonary vascularity. Peribronchial thickening and perihilar infiltrates suggesting chronic bronchitis.  No developing focal consolidation.  Suggestion of bronchiectasis in the lung bases.  No pneumothorax.  Mediastinal contours appear intact.  IMPRESSION: Chronic bronchitic changes with possible bronchiectasis in the lungs.  No focal acute consolidation is identified.   Original   Report Authenticated By: William Stevens, M.D.     Medications: I have reviewed the patient's current  medications.  Assessment/Plan: ODYNOPHAGIA ETILOGY UNCLEAR AND SELF REPORTED HX: GERD WITH OCCASION NOCTURNAL REGURGITATION   PLAN: 1. EGD JAN 20 2. FULL LIQUIDS AFTER 6 OM. NPO AFTER MN 3. HOLD LOVENOX 4. BID PP 5. LOW FAT DIET. PT GIVEN HO ON LIFESTYLE REMEDIES TO HELP CONTROL REFLUX.  LOS: 2 days   Sandi Fields 06/11/2012, 10:37 AM   

## 2012-06-12 NOTE — Progress Notes (Signed)
*  PRELIMINARY RESULTS* Echocardiogram 2D Echocardiogram has been performed.  Conrad [place] 06/12/2012, 10:05 AM

## 2012-06-12 NOTE — Progress Notes (Signed)
ANTIBIOTIC CONSULT NOTE  Pharmacy Consult for Vancomycin and Zosyn  Indication: pneumonia HCAP  No Known Allergies  Patient Measurements: Height: 6\' 2"  (188 cm) Weight: 220 lb (99.791 kg) IBW/kg (Calculated) : 82.2   Vital Signs: Temp: 97.7 F (36.5 C) (01/20 0515) Temp src: Oral (01/20 0515) BP: 161/83 mmHg (01/20 0610) Pulse Rate: 79  (01/20 0610) Intake/Output from previous day: 01/19 0701 - 01/20 0700 In: 963 [P.O.:960; I.V.:3] Out: 300 [Urine:300] Intake/Output from this shift:    Labs:  Basename 06/12/12 0539 06/11/12 0657 06/09/12 2123  WBC -- 16.6* 11.3*  HGB -- 12.3* 12.0*  PLT -- 297 236  LABCREA -- -- --  CREATININE 0.56 -- 0.53   Estimated Creatinine Clearance: 127 ml/min (by C-G formula based on Cr of 0.56).  Basename 06/11/12 1849  VANCOTROUGH 9.8*  VANCOPEAK --  Drue Dun --  GENTTROUGH --  GENTPEAK --  GENTRANDOM --  TOBRATROUGH --  TOBRAPEAK --  TOBRARND --  AMIKACINPEAK --  AMIKACINTROU --  AMIKACIN --     Microbiology: Recent Results (from the past 720 hour(s))  CULTURE, BLOOD (ROUTINE X 2)     Status: Normal   Collection Time   05/14/12 11:28 PM      Component Value Range Status Comment   Specimen Description Blood RIGHT ARM   Final    Special Requests     Final    Value: BOTTLES DRAWN AEROBIC AND ANAEROBIC 6 CC IN ANA AND 8 CC IN AEB   Culture NO GROWTH 5 DAYS   Final    Report Status 05/19/2012 FINAL   Final   CULTURE, BLOOD (ROUTINE X 2)     Status: Normal   Collection Time   05/14/12 11:45 PM      Component Value Range Status Comment   Specimen Description Blood RIGHT HAND   Final    Special Requests     Final    Value: BOTTLES DRAWN AEROBIC AND ANAEROBIC 5 CC IN ANA AND 6 CC IN AEB   Culture NO GROWTH 5 DAYS   Final    Report Status 05/19/2012 FINAL   Final   CULTURE, EXPECTORATED SPUTUM-ASSESSMENT     Status: Normal   Collection Time   05/16/12  7:00 AM      Component Value Range Status Comment   Specimen  Description SPUTUM   Final    Special Requests NONE   Final    Sputum evaluation     Final    Value: MICROSCOPIC FINDINGS SUGGEST THAT THIS SPECIMEN IS NOT REPRESENTATIVE OF LOWER RESPIRATORY SECRETIONS. PLEASE RECOLLECT.     CALLED TO BULLINS,M AT 0840 BY HUFFINES,S ON 05/16/12   Report Status 05/16/2012 FINAL   Final     Medical History: Past Medical History  Diagnosis Date  . Hypertension   . Gout   . High cholesterol   . Asthma     Medications:  Scheduled:     . allopurinol  300 mg Oral q morning - 10a  . aspirin EC  81 mg Oral Daily  . carvedilol  25 mg Oral BID WC  . [COMPLETED] chlorpheniramine-HYDROcodone  5 mL Oral Q12H  . cloNIDine  0.2 mg Oral BID  . docusate sodium  100 mg Oral BID  . enoxaparin (LOVENOX) injection  40 mg Subcutaneous Q24H  . guaiFENesin  1,200 mg Oral BID  . insulin aspart  0-15 Units Subcutaneous TID WC  . insulin aspart  0-5 Units Subcutaneous QHS  . ipratropium  0.5  mg Nebulization Q6H  . irbesartan  300 mg Oral Daily  . levalbuterol  0.63 mg Nebulization Q6H  . levofloxacin (LEVAQUIN) IV  750 mg Intravenous Q24H  . methylPREDNISolone (SOLU-MEDROL) injection  60 mg Intravenous Q6H  . pantoprazole  40 mg Oral BID AC  . piperacillin-tazobactam (ZOSYN)  IV  3.375 g Intravenous Q8H  . simvastatin  40 mg Oral QPM  . sodium chloride  3 mL Intravenous Q12H  . vancomycin  1,000 mg Intravenous Q8H  . [DISCONTINUED] albuterol  2.5 mg Nebulization Q4H  . [DISCONTINUED] enoxaparin (LOVENOX) injection  40 mg Subcutaneous Q24H  . [DISCONTINUED] ipratropium  0.5 mg Nebulization Q4H  . [DISCONTINUED] methylPREDNISolone (SOLU-MEDROL) injection  125 mg Intravenous Q6H   Assessment: Okay for Protocol Estimated Creatinine Clearance: 127 ml/min (by C-G formula based on Cr of 0.56). HCAP  Treatment with Levaquin, Vancomycin and Zosyn Trough level is below goal.    Goal of Therapy:  Vancomycin trough level 15-20 mcg/ml  Plan:  Zosyn 3.375gm IV every 8  hours. Increase Vancomycin to 1250mg  IV every 8 hours. Measure antibiotic drug levels at steady state Follow up culture results  Valrie Hart A 06/12/2012,7:47 AM

## 2012-06-12 NOTE — Op Note (Addendum)
[facility] [address] [place], [address]   ENDOSCOPY PROCEDURE REPORT  PATIENT: Antonio Baker, Antonio Baker  MR#: [id number] BIRTHDATE: Oct 23, 1955 , 56  yrs. old GENDER: Male  ENDOSCOPIST: Jonette Eva, MD REFERRED BY:[Referring PhysicianDR. Neomia Dear, [place] PROCEDURE DATE: 06/12/2012 PROCEDURE:   EGD w/ biopsy  INDICATIONS:odynophagia-On steroids since Dec 2013 for sob. MEDICATIONS: Demerol 100 mg IV, Versed 6 mg IV, and PREOP: Promethazine (Phenergan) 12.5mg  IV TOPICAL ANESTHETIC:   Cetacaine Spray DESCRIPTION OF PROCEDURE:     Physical exam was performed.  Informed consent was obtained from the patient after explaining the benefits, risks, and alternatives to the procedure.  The patient was connected to the monitor and placed in the left lateral position.  Continuous oxygen was provided by nasal cannula and IV medicine administered through an indwelling cannula.  After administration of sedation, the patients esophagus was intubated and the EG-[id number] ([id number])  endoscope was advanced under direct visualization to the second portion of the duodenum.  The scope was removed slowly by carefully examining the color, texture, anatomy, and integrity of the mucosa on the way out.  The patient was recovered in endoscopy and discharged home in satisfactory condition.   ESOPHAGUS: WHITE PLAQUES ON WALL OF ESOPHAGUS BEGINING AT THE UES AND CONTINUING TO THE GE JUNCTION. Brush biopsies obtained and sent forKOH PREP. A single round, shallow and clean-based ulcer measuring 3 x 6mm in size was found at the gastroesophageal junction.  Biopsies were taken at edge of the ulcer, at the center of the ulcer and around the ulcer.   A stricture was found at the gastroesophageal junction.  The stenosis was traversable with the endoscope.   A small hiatal hernia was noted.   STOMACH: Moderate non-erosive gastritis (inflammation) was found in the gastric antrum.  Multiple biopsies  were performed using cold forceps. DUODENUM: The duodenal mucosa showed no abnormalities in the bulb and second portion of the duodenum. COMPLICATIONS:   None  ENDOSCOPIC IMPRESSION: 1.   ESOPHAGUS: WHITE PLAQUES MOST LIKELY DUE TO CANDIDA ESOPHAGITIS 2.   Single ulcer at the gastroesophageal junction-MOST LIKELY CAUSE FOR ODYNOPHAGIA. ETIOLOGY UNKNOWN-MAY BE EROSIVE ESOPHAGITIS/GERD, HSV OR CMV ESOPHAGITIS 3.   Stricture  at the gastroesophageal junction 4.   Small hiatal hernia 5.   MODERATE Non-erosive gastritis  RECOMMENDATIONS: AWAIT BIOPSY SOFT MECHANICAL LOW FAT DIET BID PPI AWAIT KOH PREP. ADD DIFLUCAN 200 MG x1 THEN 100 MG PO QD FOR 20 DAYS   REPEAT EXAM:   _______________________________ Rosalie DoctorJonette Eva, MD 06/13/2012 1:12 PM Revised: 06/13/2012 1:12 PM      PATIENT NAME:  Antonio Baker MR#: [id number]

## 2012-06-12 NOTE — Care Management Note (Signed)
    Page 1 of 1   06/14/2012     3:34:47 PM   CARE MANAGEMENT NOTE 06/14/2012  Patient:  Antonio Baker,Antonio Baker   Account Number:  [id number]  Date Initiated:  06/12/2012  Documentation initiated by:  Rosemary Holms  Subjective/Objective Assessment:   Pt admitted with COPD. Lives alone and is very independent with ADL. Pt states he has Baker daughter but she lives out of state.     Action/Plan:   Anticipated DC Date:  06/15/2012   Anticipated DC Plan:  HOME/SELF CARE      DC Planning Services  CM consult      Choice offered to / List presented to:             Status of service:  Completed, signed off Medicare Important Message given?   (If response is "NO", the following Medicare IM given date fields will be blank) Date Medicare IM given:   Date Additional Medicare IM given:    Discharge Disposition:  HOME/SELF CARE  Per UR Regulation:    If discussed at Long Length of Stay Meetings, dates discussed:    Comments:  06/12/12 Rosemary Holms RN BSN CM RCM requested nursing to provide COPD educational material

## 2012-06-12 NOTE — OR Nursing (Signed)
Patient scheduled for an EGD. Prior to patient going to the procedure room he began to cough and state, "I can't get it up." Patient also complained of wheezing. Patient had Xopenex nebulizer at [phone]. Dr. Darrick Penna notified and ordered Albuterol nebulizer prior to procedure.

## 2012-06-12 NOTE — Interval H&P Note (Signed)
History and Physical Interval Note:  06/12/2012 8:53 AM  Antonio Baker  has presented today for surgery, with the diagnosis of odynophagia, thickened lower esophagus on CT  The various methods of treatment have been discussed with the patient and family. After consideration of risks, benefits and other options for treatment, the patient has consented to  Procedure(s) (LRB) with comments: ESOPHAGOGASTRODUODENOSCOPY (EGD) (N/A) as a surgical intervention .  The patient's history has been reviewed, patient examined, no change in status, stable for surgery.  I have reviewed the patient's chart and labs.  Questions were answered to the patient's satisfaction.     [employer]

## 2012-06-12 NOTE — Progress Notes (Signed)
[facility]             Progress Note   Subjective: Slow improvement in breathing, still coughing, had EGD today.  Objective: Vital signs in last 24 hours: Temp:  [97.2 F (36.2 C)-97.8 F (36.6 C)] 97.8 F (36.6 C) (01/20 1220) Pulse Rate:  [70-98] 77  (01/20 1220) Resp:  [19-24] 20  (01/20 1220) BP: (152-185)/(77-110) 152/77 mmHg (01/20 1220) SpO2:  [94 %-97 %] 96 % (01/20 1414) FiO2 (%):  [21 %] 21 % (01/20 0832) Weight:  [99.791 kg (220 lb)] 99.791 kg (220 lb) (01/20 1018) Weight change:  Last BM Date: 06/11/12  Intake/Output from previous day: 01/19 0701 - 01/20 0700 In: 1955 [P.O.:1200; I.V.:3; IV Piggyback:750] Out: 300 [Urine:300]     Physical Exam: General: Alert, awake, oriented x3, in no acute distress. HEENT: No bruits, no goiter. Heart: Regular rate and rhythm, without murmurs, rubs, gallops. Lungs: B/l exp wheezes Abdomen: Soft, nontender, nondistended, positive bowel sounds. Extremities: No clubbing cyanosis or edema with positive pedal pulses. Neuro: Grossly intact, nonfocal.    Lab Results: Basic Metabolic Panel:  Basename 06/12/12 0539 06/09/12 2123  NA 140 137  K 3.7 4.2  CL 106 102  CO2 26 24  GLUCOSE 142* 107*  BUN 15 10  CREATININE 0.56 0.53  CALCIUM 9.6 9.1  MG -- --  PHOS -- --   Liver Function Tests:  [facility] 06/09/12 2123  AST 16  ALT 26  ALKPHOS 105  BILITOT 0.2*  PROT 6.8  ALBUMIN 3.3*   No results found for this basename: LIPASE:2,AMYLASE:2 in the last 72 hours No results found for this basename: AMMONIA:2 in the last 72 hours CBC:  Basename 06/11/12 0657 06/09/12 2123  WBC 16.6* 11.3*  NEUTROABS -- 7.1  HGB 12.3* 12.0*  HCT 36.5* 35.5*  MCV 91.7 91.3  PLT 297 236   Cardiac Enzymes:  Basename 06/09/12 2123  CKTOTAL --  CKMB --  CKMBINDEX --  TROPONINI <0.30   BNP:  Basename 06/09/12 2123  PROBNP 125.7*   D-Dimer: No results found for this basename: DDIMER:2 in the last 72  hours CBG:  Basename 06/12/12 0724 06/11/12 2043 06/11/12 1629 06/11/12 1139 06/11/12 0737 06/10/12 0748  GLUCAP 130* 192* 164* 142* 160* 140*   Hemoglobin A1C: No results found for this basename: HGBA1C in the last 72 hours Fasting Lipid Panel: No results found for this basename: CHOL,HDL,LDLCALC,TRIG,CHOLHDL,LDLDIRECT in the last 72 hours Thyroid Function Tests: No results found for this basename: TSH,T4TOTAL,FREET4,T3FREE,THYROIDAB in the last 72 hours Anemia Panel: No results found for this basename: VITAMINB12,FOLATE,FERRITIN,TIBC,IRON,RETICCTPCT in the last 72 hours Coagulation: No results found for this basename: LABPROT:2,INR:2 in the last 72 hours Urine Drug Screen: Drugs of Abuse  No results found for this basename: labopia,  cocainscrnur,  labbenz,  amphetmu,  thcu,  labbarb    Alcohol Level: No results found for this basename: ETH:2 in the last 72 hours Urinalysis: No results found for this basename: COLORURINE:2,APPERANCEUR:2,LABSPEC:2,PHURINE:2,GLUCOSEU:2,HGBUR:2,BILIRUBINUR:2,KETONESUR:2,PROTEINUR:2,UROBILINOGEN:2,NITRITE:2,LEUKOCYTESUR:2 in the last 72 hours  Recent Results (from the past 240 hour(s))  KOH PREP     Status: Normal   Collection Time   06/12/12 12:00 PM      Component Value Range Status Comment   Specimen Description ESOPHAGUS BRUSHING   Final    Special Requests NONE   Final    KOH Prep     Final    Value: MODERATE YEAST WITH PSEUDOHYPHAE     Performed at [facility]   Report  Status 06/12/2012 FINAL   Final     Studies/Results: No results found.  Medications: Scheduled Meds:    . allopurinol  300 mg Oral q morning - 10a  . aspirin EC  81 mg Oral Daily  . carvedilol  25 mg Oral BID WC  . cloNIDine  0.2 mg Oral BID  . docusate sodium  100 mg Oral BID  . enoxaparin (LOVENOX) injection  40 mg Subcutaneous Q24H  . fluconazole  100 mg Oral Daily  . guaiFENesin  1,200 mg Oral BID  . insulin aspart  0-15 Units Subcutaneous TID WC  .  insulin aspart  0-5 Units Subcutaneous QHS  . ipratropium  0.5 mg Nebulization Q6H  . irbesartan  300 mg Oral Daily  . levalbuterol  0.63 mg Nebulization Q6H  . levofloxacin (LEVAQUIN) IV  750 mg Intravenous Q24H  . meperidine      . methylPREDNISolone (SOLU-MEDROL) injection  60 mg Intravenous Q6H  . midazolam      . pantoprazole  40 mg Oral BID AC  . piperacillin-tazobactam (ZOSYN)  IV  3.375 g Intravenous Q8H  . simvastatin  40 mg Oral QPM  . sodium chloride  3 mL Intravenous Q12H  . vancomycin  1,250 mg Intravenous Q8H   Continuous Infusions:   PRN Meds:.acetaminophen, acetaminophen, albuterol, alum & mag hydroxide-simeth, chlorpheniramine-HYDROcodone, cyclobenzaprine, gi cocktail, ondansetron (ZOFRAN) IV, ondansetron, oxyCODONE  Assessment/Plan:  Principal Problem:  *COPD exacerbation Active Problems:  Hypertension  GERD (gastroesophageal reflux disease)  HCAP (healthcare-associated pneumonia)  Plan: this gentleman was admitted to the hospital for shortness of breath, felt to be due to copd exacerbation.  He was recently discharged from the hospital on 12/25 after being treated for pneumonia and asthma/copd.  1. Acute respiratory failure possibly due to copd exacerbation. Patient is on nebulizer treatments, steroids and antibiotics. Patient has been slow to progress. Continue current treatments. BNP elevated, Echo done today, report is pending. Flu screen was negative.  2. Pneumonia. CT chest indicated ground glass opacities.  These were felt to be due to underlying pneumonia. Patient has been started on IV antibiotics.  Appreciate Pulmonology assistance.  Continue current treatments.  3. Esophagitis, likely candidal.  Patient underwent EGD by Dr. Darrick Penna today. Started on diflucan. Patient has been on and off steroids which increases his risk for candida. Will also check HIV status.  Patient also found to have an esophageal ulcer. This has been biopsied  Continue PPI.  3. HTN.   Stable  4. Steroid induced leukocytosis.  Time spent coordinating care:   LOS: 3 days   Cristino Degroff [facility] Pager: [phone] 06/12/2012, 4:18 PM

## 2012-06-12 NOTE — Clinical Social Work Note (Signed)
CSW received referral for completion of Advance Directive. Pt is interested in living will and HCPOA. Left packet in room and will follow up tomorrow.   Derenda Fennel, [place] [phone]

## 2012-06-12 NOTE — Progress Notes (Signed)
UR Chart Review Completed  

## 2012-06-13 DIAGNOSIS — B3781 Candidal esophagitis: Secondary | ICD-10-CM

## 2012-06-13 LAB — LEGIONELLA ANTIGEN, URINE: Legionella Antigen, Urine: NEGATIVE

## 2012-06-13 LAB — GLUCOSE, CAPILLARY: Glucose-Capillary: 144 mg/dL — ABNORMAL HIGH (ref 70–99)

## 2012-06-13 MED ORDER — LEVOFLOXACIN 750 MG PO TABS
750.0000 mg | ORAL_TABLET | Freq: Every day | ORAL | Status: DC
  Administered 2012-06-14: 750 mg via ORAL
  Filled 2012-06-13: qty 1

## 2012-06-13 MED ORDER — LEVOFLOXACIN 500 MG PO TABS: 500.0000 mg | ORAL_TABLET | Freq: Every day | ORAL | Status: DC

## 2012-06-13 MED ORDER — ALBUTEROL SULFATE (5 MG/ML) 0.5% IN NEBU: 5.0000 mg | INHALATION_SOLUTION | RESPIRATORY_TRACT | Status: DC | PRN

## 2012-06-13 NOTE — Clinical Social Work Note (Signed)
CSW followed up with pt regarding Advance Directives. He is still interested in completing, but would like to wait a little while as his primary concern right now is his current medical issues. He will contact CSW if he is ready prior to d/c. Left number for financial counselor due to concerns about hospital bill.  Derenda Fennel, [place] [phone]

## 2012-06-13 NOTE — Progress Notes (Signed)
[facility]             Progress Note   Subjective: Slow improvement in breathing-albuterol gives him some relief of his cough but doesn't take it away. 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 He tells me that he has had a history of asthma diagnosed as a child as well. He was seen by a pulmonologist after last discharge in December.  Objective: Vital signs in last 24 hours: Temp:  [97.1 F (36.2 C)-97.9 F (36.6 C)] 97.8 F (36.6 C) (01/21 1438) Pulse Rate:  [80-90] 90  (01/21 1438) Resp:  [19-20] 20  (01/21 1438) BP: (159-176)/(81-91) 176/91 mmHg (01/21 1438) SpO2:  [91 %-99 %] 95 % (01/21 1438) FiO2 (%):  [21 %] 21 % (01/21 1413) Weight change:  Last BM Date: 06/11/12  Intake/Output from previous day: 01/20 0701 - 01/21 0700 In: 360 [P.O.:360] Out: -      Physical Exam: General: Alert, awake, oriented x3, in no acute distress. HEENT: No bruits, no goiter. Heart: Regular rate and rhythm, without murmurs, rubs, gallops. Lungs: B/l exp wheezes Abdomen: Soft, nontender, nondistended, positive bowel sounds. Extremities: No clubbing cyanosis or edema with positive pedal pulses. Neuro: Grossly intact, nonfocal.    Lab Results: Basic Metabolic Panel:  Basename 06/12/12 0539  NA 140  K 3.7  CL 106  CO2 26  GLUCOSE 142*  BUN 15  CREATININE 0.56  CALCIUM 9.6  MG --  PHOS --   Liver Function Tests: No results found for this basename: AST:2,ALT:2,ALKPHOS:2,BILITOT:2,PROT:2,ALBUMIN:2 in the last 72 hours No results found for this basename: LIPASE:2,AMYLASE:2 in the last 72 hours No results found for this basename: AMMONIA:2 in the last 72 hours CBC:  Basename 06/11/12 0657  WBC 16.6*  NEUTROABS --  HGB 12.3*  HCT 36.5*  MCV 91.7  PLT 297   Cardiac Enzymes: No results found for this basename: CKTOTAL:3,CKMB:3,CKMBINDEX:3,TROPONINI:3 in the last 72 hours BNP: No  results found for this basename: PROBNP:3 in the last 72 hours D-Dimer: No results found for this basename: DDIMER:2 in the last 72 hours CBG:  Basename 06/13/12 1136 06/13/12 0743 06/12/12 2127 06/12/12 1642 06/12/12 0724 06/11/12 2043  GLUCAP 134* 135* 129* 162* 130* 192*   Hemoglobin A1C: No results found for this basename: HGBA1C in the last 72 hours Fasting Lipid Panel: No results found for this basename: CHOL,HDL,LDLCALC,TRIG,CHOLHDL,LDLDIRECT in the last 72 hours Thyroid Function Tests: No results found for this basename: TSH,T4TOTAL,FREET4,T3FREE,THYROIDAB in the last 72 hours Anemia Panel: No results found for this basename: VITAMINB12,FOLATE,FERRITIN,TIBC,IRON,RETICCTPCT in the last 72 hours Coagulation: No results found for this basename: LABPROT:2,INR:2 in the last 72 hours Urine Drug Screen: Drugs of Abuse  No results found for this basename: labopia,  cocainscrnur,  labbenz,  amphetmu,  thcu,  labbarb    Alcohol Level: No results found for this basename: ETH:2 in the last 72 hours Urinalysis: No results found for this basename: COLORURINE:2,APPERANCEUR:2,LABSPEC:2,PHURINE:2,GLUCOSEU:2,HGBUR:2,BILIRUBINUR:2,KETONESUR:2,PROTEINUR:2,UROBILINOGEN:2,NITRITE:2,LEUKOCYTESUR:2 in the last 72 hours  Recent Results (from the past 240 hour(s))  KOH PREP     Status: Normal   Collection Time   06/12/12 12:00 PM      Component Value Range Status Comment   Specimen Description ESOPHAGUS BRUSHING   Final    Special Requests NONE   Final    KOH Prep     Final    Value: MODERATE YEAST WITH PSEUDOHYPHAE     Performed at [facility]  Hospital   Report Status 06/12/2012 FINAL   Final     Studies/Results: No results found.  Medications: Scheduled Meds:    . allopurinol  300 mg Oral q morning - 10a  . aspirin EC  81 mg Oral Daily  . carvedilol  25 mg Oral BID WC  . cloNIDine  0.2 mg Oral BID  . docusate sodium  100 mg Oral BID  . enoxaparin (LOVENOX) injection  40 mg  Subcutaneous Q24H  . fluconazole  100 mg Oral Daily  . guaiFENesin  1,200 mg Oral BID  . insulin aspart  0-15 Units Subcutaneous TID WC  . insulin aspart  0-5 Units Subcutaneous QHS  . ipratropium  0.5 mg Nebulization Q6H  . irbesartan  300 mg Oral Daily  . levalbuterol  0.63 mg Nebulization Q6H  . levofloxacin  500 mg Oral Daily  . methylPREDNISolone (SOLU-MEDROL) injection  60 mg Intravenous Q6H  . pantoprazole  40 mg Oral BID AC  . simvastatin  40 mg Oral QPM  . sodium chloride  3 mL Intravenous Q12H   Continuous Infusions:   PRN Meds:.acetaminophen, acetaminophen, albuterol, alum & mag hydroxide-simeth, chlorpheniramine-HYDROcodone, cyclobenzaprine, gi cocktail, ondansetron (ZOFRAN) IV, ondansetron, oxyCODONE  Assessment/Plan:  Principal Problem:  *COPD exacerbation Active Problems:  Hypertension  GERD (gastroesophageal reflux disease)  HCAP (healthcare-associated pneumonia)  Plan: this gentleman was admitted to the hospital for shortness of breath, felt to be due to copd exacerbation.  He was recently discharged from the hospital on 12/25 after being treated for pneumonia and asthma/copd.  1. Acute respiratory failure possibly due to copd exacerbation versus asthma-I. will order palmar function testing and we will defer to pulmonologist Dr. Juanetta Gosling was been instrumental in helping [place] manage his care. Antibiotics started 1/18 transition of 121 to oral Levaquin 500 daily. Flu screen was negative. Continue Detrol at high-dose 5 mg every 2 when necessary, Atrovent 0.5 every 6 hourly, Xopenex 0.63 mg every 6. Continue IV steroids 60 mg meter prednisone every 6 hourly  2. Pneumonia. CT chest indicated ground glass opacities.  These were felt to be due to underlying pneumonia. He see above  Appreciate Pulmonology assistance.  Continue current treatments.  3. Esophagitis, likely candidal.  Patient underwent EGD by Dr. Darrick Penna today. Started on diflucan 100 a day. Patient has been on  and off steroids which increases his risk for candida. Negative HIV status as of 05/15/29.  Patient also found to have an esophageal ulcer. This has been biopsied  Continue PPI. Rx 40 twice a day  3. HTN.  Stable-we'll discontinue Coreg 25 twice a day given concern for COPD, continue clonidine 0.2 twice a day, irbesartan 300 daily.  4. Steroid induced leukocytosis.  Time spent coordinating care:   LOS: 4 days   Rhetta Mura [facility] Pager: [phone] 06/13/2012, 4:08 PM

## 2012-06-13 NOTE — Progress Notes (Addendum)
AWAIT BIOPSY. CONTINUE DIFLUCAN.  REVIEWED.

## 2012-06-13 NOTE — Progress Notes (Signed)
Subjective: No odynophagia or dysphagia. Tolerating diet. No N/V.   Objective: Vital signs in last 24 hours: Temp:  [97.1 F (36.2 C)-98.7 F (37.1 C)] 97.1 F (36.2 C) (01/21 0435) Pulse Rate:  [70-98] 80  (01/21 0435) Resp:  [19-24] 19  (01/21 0435) BP: (152-185)/(77-110) 159/81 mmHg (01/21 0435) SpO2:  [94 %-99 %] 95 % (01/21 0728) FiO2 (%):  [21 %] 21 % (01/20 0832) Weight:  [220 lb (99.791 kg)] 220 lb (99.791 kg) (01/20 1018) Last BM Date: 06/11/12 General:   Alert and oriented, pleasant Head:  Normocephalic and atraumatic. Eyes:  No icterus, sclera clear. Conjuctiva pink.  Heart:  S1, S2 present, no murmurs noted.  Lungs: scattered rhonchi  Abdomen:  Bowel sounds present, soft, non-tender, non-distended.  Msk:  Symmetrical without gross deformities. Normal posture. Extremities:  Without clubbing or edema. Neurologic:  Alert and  oriented x4;  grossly normal neurologically. Skin:  Warm and dry, intact without significant lesions.  Psych:  Alert and cooperative. Normal mood and affect.  Intake/Output from previous day: 01/20 0701 - 01/21 0700 In: 360 [P.O.:360] Out: -  Intake/Output this shift:    Lab Results:  Basename 06/11/12 0657  WBC 16.6*  HGB 12.3*  HCT 36.5*  PLT 297   BMET  Basename 06/12/12 0539  NA 140  K 3.7  CL 106  CO2 26  GLUCOSE 142*  BUN 15  CREATININE 0.56  CALCIUM 9.6     Studies/Results: EGD: likely candida esophagitis, single ulcer at GE junction, stricture at GE junction, moderate non-erosive gastritis   Assessment: 57 year old male admitted with acute respiratory failure and findings of thickened esophagus on CT. EGD with candida esophagitis and ulcer at GE junction. KOH prep with +yeast. Diflucan has been started for treatment. Pt clinically improving without further dysphagia or odynophagia.   Plan: Diflucan 100 mg po daily X 20 days  BID PPI Outpatient follow-up in our office   LOS: 4 days   Antonio Baker  06/13/2012, 7:53  AM

## 2012-06-13 NOTE — Progress Notes (Signed)
He says he feels better. He is not clear about the results of his endoscopy because of the medication he received swallowing over that with him today. I think his abnormalities on x-ray are likely related to acute on chronic aspiration. I think he does have some element of asthma/COPD as well  He looks more comfortable. He's able to walk around without being short of breath. His cough is better. His chest is much clearer than earlier  He has gastrointestinal abnormalities as noted by Dr. Darrick Penna. I think he has aspirated. He does have some element of COPD

## 2012-06-14 ENCOUNTER — Telehealth: Payer: Self-pay | Admitting: Urgent Care

## 2012-06-14 ENCOUNTER — Encounter (HOSPITAL_COMMUNITY): Payer: Self-pay | Admitting: Gastroenterology

## 2012-06-14 ENCOUNTER — Other Ambulatory Visit (HOSPITAL_COMMUNITY): Payer: Self-pay

## 2012-06-14 DIAGNOSIS — B3781 Candidal esophagitis: Secondary | ICD-10-CM

## 2012-06-14 DIAGNOSIS — R1310 Dysphagia, unspecified: Secondary | ICD-10-CM

## 2012-06-14 DIAGNOSIS — J449 Chronic obstructive pulmonary disease, unspecified: Secondary | ICD-10-CM

## 2012-06-14 LAB — BASIC METABOLIC PANEL
Calcium: 9.4 mg/dL (ref 8.4–10.5)
Chloride: 103 mEq/L (ref 96–112)
Creatinine, Ser: 0.64 mg/dL (ref 0.50–1.35)
GFR calc Af Amer: >90 mL/min (ref >90)

## 2012-06-14 LAB — GLUCOSE, CAPILLARY

## 2012-06-14 MED ORDER — GUAIFENESIN ER 600 MG PO TB12: 1200.0000 mg | ORAL_TABLET | Freq: Two times a day (BID) | ORAL | Status: DC

## 2012-06-14 MED ORDER — PREDNISONE 50 MG PO TABS: ORAL_TABLET | ORAL | Status: DC

## 2012-06-14 MED ORDER — LEVOFLOXACIN 750 MG PO TABS: 750.0000 mg | ORAL_TABLET | Freq: Every day | ORAL | Status: DC

## 2012-06-14 MED ORDER — HYDROCHLOROTHIAZIDE 12.5 MG PO CAPS: 25.0000 mg | ORAL_CAPSULE | Freq: Every day | ORAL | Status: DC

## 2012-06-14 MED ORDER — ALBUTEROL SULFATE (5 MG/ML) 0.5% IN NEBU: 5.0000 mg | INHALATION_SOLUTION | RESPIRATORY_TRACT | Status: DC | PRN

## 2012-06-14 MED ORDER — PANTOPRAZOLE SODIUM 40 MG PO TBEC: 40.0000 mg | DELAYED_RELEASE_TABLET | Freq: Two times a day (BID) | ORAL | Status: DC

## 2012-06-14 MED ORDER — FLUCONAZOLE 100 MG PO TABS: 100.0000 mg | ORAL_TABLET | Freq: Every day | ORAL | Status: DC

## 2012-06-14 NOTE — Telephone Encounter (Signed)
Changed OV to 3/19 at 1030 with KJ and appt was mailed

## 2012-06-14 NOTE — Progress Notes (Signed)
Subjective: Odynophagia improved.  Denies heartburn, nausea or vomiting.  +candida on Bx, HSV/CMV pending.  NEGATIVE H pylori, reactive gastropathy.  Denies melena or rectal bleeding.  Objective: Vital signs in last 24 hours: Temp:  [97.3 F (36.3 C)-98.2 F (36.8 C)] 97.3 F (36.3 C) (01/22 0442) Pulse Rate:  [90-96] 96  (01/22 0442) Resp:  [19-20] 19  (01/22 0442) BP: (176-190)/(91-104) 177/103 mmHg (01/22 0442) SpO2:  [91 %-98 %] 96 % (01/22 0718) FiO2 (%):  [21 %] 21 % (01/21 1413) Last BM Date: 06/13/12 No LMP for male patient. Body mass index is 28.25 kg/(m^2). General:   Alert,  Well-developed, well-nourished, pleasant and cooperative in NAD Eyes:  Sclera clear, no icterus.   Conjunctiva pink. Mouth:  oropharynx pink & moist. Heart:  Regular rate and rhythm Abdomen:   Normal bowel sounds.  Soft, nontender and nondistended.  No guarding or rebound tenderness.   Msk:  Symmetrical without gross deformities.  Pulses:  Normal pulses noted. Extremities:  Without edema. Neurologic:  Alert and  oriented x4;  grossly normal neurologically. Skin:  Intact without significant lesions or rashes. Psych:  Alert and cooperative. Normal mood and affect.  Intake/Output from previous day:    Lab Results: BMET  Basename 06/14/12 0437 06/12/12 0539  NA 139 140  K 3.8 3.7  CL 103 106  CO2 29 26  GLUCOSE 142* 142*  BUN 16 15  CREATININE 0.64 0.56  CALCIUM 9.4 9.6    Assessment: Candida esophagitis, KOH/bx proven:  Much improved.  HSV & CMV studies pending.  Plan: 1. Complete diflucan as directed 2. BID PPI 3. Call if any problems 4. OV for outpatient colonoscopy in 4-6 weeks  LOS: 5 days   Lorenza Burton  06/14/2012, 10:41 AM

## 2012-06-14 NOTE — Progress Notes (Signed)
REVIEWED.  COMPLETE RX FOR CANDIDA ESOPHAGITIS. OPV IN 2 MOS.

## 2012-06-14 NOTE — Discharge Summary (Signed)
Physician Discharge Summary  Antonio Baker MVH:[id number] DOB: May 28, 1955 DOA: 06/09/2012  PCP: No primary provider on file.  Admit date: 06/09/2012 Discharge date: 06/14/2012  Time spent: 35 minutes   Recommendations for Outpatient Follow-up:  1. Patient complaint 20 day course of Diflucan 100 daily on 06/29/2038 2. Patient complete course of levofloxacin for pneumonia on 06/16/2012 3. Recommend outpatient spirometry to characterize COPD versus asthma-patient also has a history of occupational pneumoconiosis 4. Will need basic metabolic panel and 1 week or so at outpatient physician's office 5. His albuterol p nebulizer is increased from 2.5-5 mls every 4  Discharge Diagnoses:  Principal Problem:  *COPD exacerbation Active Problems:  Hypertension  GERD (gastroesophageal reflux disease)  HCAP (healthcare-associated pneumonia)   Discharge Condition: Good  Diet recommendation: Heart healthy  Filed Weights   06/09/12 2108 06/12/12 1018  Weight: 99.791 kg (220 lb) 99.791 kg (220 lb)    History of present illness:  Pleasant 57 year old male with uncharacterized lung disease admitted 12/22 for COPD exacerbation return to hospital 06/10/2012 with worsening dyspnea and severe coughing fits with history of reflux and 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  Hospital Course:  1. Acute respiratory failure possibly due to copd exacerbation versus asthma-patient sats were in the 60s and he was admitted but came up nicely with oxygen. Dr. Juanetta Gosling was been instrumental in helping [place] manage his care-spirometry was ordered as an inpatient however this was discontinued and he can probably be discharged back to the care of Dr. Christell Constant as well as Dr. Cynda Acres can be done as an outpatient to determine the risks of occupational exposure versus this being COPD. He also states he has a history of asthma.  He will need probably a protective  mask lichen and 95 mask for respiratory pathogens versus strict respiratory precautions while working Flu screen was negative. IV steroids were discontinued and patient was placed on nontapering dose of 50 mg of prednisone for 7 days and instructed to closely followup with Dr. Ivette Loyal and 2. Healthcare Associated Pneumonia. CT chest indicated ground glass opacities. patient was admitted and because of the abnormal chest CT pulmonology consult was requested. It was thought that instead of bronchitis he may have an atypical infection-he was kept on broad-spectrum antibiotics until 05/24/2012. His white count was not indicative of infection and he had no further fevers. This was narrowed to levofloxacin 750 mg daily on 06/13/2012. He did pretty well subsequent to this and will complete 2 more days ending on 06/16/2012. 3. Esophagitis, likely candidal. Patient underwent EGD by Dr. Darrick Penna 06/12/12. Started on diflucan 100 a day to complete 3 days of this Patient has been on and off steroids which increases his risk for candida. Negative HIV status as of 05/15/29. Patient also found to have an esophageal ulcer. This has been biopsied-this showed reactive gastropathy and tender deal ulcer with no evidence of Helicobacter. Continue PPIRx 40 twice a day  3. HTN. Stable-we'll discontinue Coreg 25 twice a day given concern for COPD, continue clonidine 0.2 twice a day, irbesartan 300 daily-on discharge he was started on HCTZ 25 mg and will need to be met in 1-2 weeks subsequent to 4. Steroid induced leukocytosis.  Procedures:  EGD 1/20  Consultations:  Pulmonary-Dr. Juanetta Gosling  Gastroenterology-Dr. fields  Discharge Exam: Filed Vitals:   06/13/12 2101 06/13/12 2103 06/14/12 0442 06/14/12 0718  BP: 189/102 190/104 177/103   Pulse: 95 90 96   Temp: 98.2 F (36.8  C)  97.3 F (36.3 C)   TempSrc: Oral  Oral   Resp: 19  19   Height:      Weight:      SpO2: 98%  95% 96%   Doing well no further issues. States he is  mobilizing more mucus and feels less short of breath. Able to ambulate without any issue and verbalizing well No chest pain  General: Alert pleasant oriented Cardiovascular: Some S2 no murmur rub or gallop Respiratory: Light wheeze left lower lung however clear throughout otherwise no fremitus or resonance  Discharge Instructions  Discharge Orders    Future Orders Please Complete By Expires   Diet - low sodium heart healthy      Increase activity slowly      Call MD for:  temperature >100.4      Call MD for:  severe uncontrolled pain      Call MD for:  redness, tenderness, or signs of infection (pain, swelling, redness, odor or green/yellow discharge around incision site)      Call MD for:  difficulty breathing, headache or visual disturbances      Call MD for:  persistant dizziness or light-headedness          Medication List     As of 06/14/2012 10:14 AM    TAKE these medications         albuterol 108 (90 BASE) MCG/ACT inhaler   Commonly known as: PROVENTIL HFA;VENTOLIN HFA   Inhale 2 puffs into the lungs 3 (three) times daily. And every 4 hours as needed for wheezing, chest congestion, or shortness of breath.      albuterol (5 MG/ML) 0.5% nebulizer solution   Commonly known as: PROVENTIL   Take 1 mL (5 mg total) by nebulization every 4 (four) hours as needed. For shortness of breath/wheezing      allopurinol 300 MG tablet   Commonly known as: ZYLOPRIM   Take 300 mg by mouth every morning.      ARTHRITIS PAIN RELIEF PO   Take 1 tablet by mouth daily as needed. For arthritis pain      budesonide 0.5 MG/2ML nebulizer solution   Commonly known as: PULMICORT   Take 0.5 mg by nebulization 2 (two) times daily.      carvedilol 25 MG tablet   Commonly known as: COREG   Take 25 mg by mouth 2 (two) times daily with a meal.      cyclobenzaprine 10 MG tablet   Commonly known as: FLEXERIL   Take 10 mg by mouth 3 (three) times daily as needed. For spasms      fluconazole 100  MG tablet   Commonly known as: DIFLUCAN   Take 1 tablet (100 mg total) by mouth daily.      guaiFENesin 600 MG 12 hr tablet   Commonly known as: MUCINEX   Take 2 tablets (1,200 mg total) by mouth 2 (two) times daily.      HYDROcodone-acetaminophen 10-325 MG per tablet   Commonly known as: NORCO   Take 1 tablet by mouth every 6 (six) hours as needed. pain      levofloxacin 750 MG tablet   Commonly known as: LEVAQUIN   Take 1 tablet (750 mg total) by mouth daily.      pantoprazole 40 MG tablet   Commonly known as: PROTONIX   Take 1 tablet (40 mg total) by mouth 2 (two) times daily before a meal.      simvastatin 40 MG  tablet   Commonly known as: ZOCOR   Take 40 mg by mouth every evening.      valsartan 160 MG tablet   Commonly known as: DIOVAN   Take 160 mg by mouth daily.          The results of significant diagnostics from this hospitalization (including imaging, microbiology, ancillary and laboratory) are listed below for reference.    Significant Diagnostic Studies: Ct Chest W Contrast  06/10/2012  *RADIOLOGY REPORT*  Clinical Data: 57 year old male with shortness of breath and cough.  CT CHEST WITH CONTRAST  Technique:  Multidetector CT imaging of the chest was performed following the standard protocol during bolus administration of intravenous contrast.  Contrast: OMNIPAQUE IOHEXOL 300 MG/ML  SOLN  Comparison: 06/09/2012 and prior chest radiographs.  04/22/2007 chest CT  Findings: Upper limits of normal mediastinal lymph nodes are unchanged. The heart and great vessels are unchanged with moderate coronary artery calcifications. There are no pleural or pericardial effusions present. Mild circumferential distal esophageal wall thickening may represent esophagitis.  Scattered areas of ground-glass opacities bilaterally are noted and have a somewhat mosaic type pattern, suggesting small airway disease. There is no evidence of consolidation, discrete mass or  endobronchial/endotracheal lesion. A slightly irregular opacity within the right upper lobe is unchanged and compatible with scar.  Fatty infiltration of the liver is again noted.  No acute or suspicious bony abnormalities are identified.  IMPRESSION: New scattered ground-glass opacities bilaterally suggestive of small airway disease. Small vessel disease or nonspecific inflammation/infection are considered less likely.  Distal esophageal wall thickening - unchanged and likely representing esophagitis.  Coronary artery disease.  Fatty infiltration of the liver.   Original Report Authenticated By: Harmon Pier, M.D.    Dg Chest Port 1 View  06/09/2012  *RADIOLOGY REPORT*  Clinical Data: Severe dyspnea.  PORTABLE CHEST - 1 VIEW  Comparison: 05/14/2012  Findings: Borderline heart size with normal pulmonary vascularity. Peribronchial thickening and perihilar infiltrates suggesting chronic bronchitis.  No developing focal consolidation.  Suggestion of bronchiectasis in the lung bases.  No pneumothorax.  Mediastinal contours appear intact.  IMPRESSION: Chronic bronchitic changes with possible bronchiectasis in the lungs.  No focal acute consolidation is identified.   Original Report Authenticated By: Burman Nieves, M.D.     Microbiology: Recent Results (from the past 240 hour(s))  KOH PREP     Status: Normal   Collection Time   06/12/12 12:00 PM      Component Value Range Status Comment   Specimen Description ESOPHAGUS BRUSHING   Final    Special Requests NONE   Final    KOH Prep     Final    Value: MODERATE YEAST WITH PSEUDOHYPHAE     Performed at [facility]   Report Status 06/12/2012 FINAL   Final      Labs: Basic Metabolic Panel:  Lab 06/14/12 1308 06/12/12 0539 06/09/12 2123  NA 139 140 137  K 3.8 3.7 4.2  CL 103 106 102  CO2 29 26 24   GLUCOSE 142* 142* 107*  BUN 16 15 10   CREATININE 0.64 0.56 0.53  CALCIUM 9.4 9.6 9.1  MG -- -- --  PHOS -- -- --   Liver Function  Tests:  Lab 06/09/12 2123  AST 16  ALT 26  ALKPHOS 105  BILITOT 0.2*  PROT 6.8  ALBUMIN 3.3*   No results found for this basename: LIPASE:5,AMYLASE:5 in the last 168 hours No results found for this basename: AMMONIA:5 in the  last 168 hours CBC:  Lab 06/11/12 0657 06/09/12 2123  WBC 16.6* 11.3*  NEUTROABS -- 7.1  HGB 12.3* 12.0*  HCT 36.5* 35.5*  MCV 91.7 91.3  PLT 297 236   Cardiac Enzymes:  Lab 06/09/12 2123  CKTOTAL --  CKMB --  CKMBINDEX --  TROPONINI <0.30   BNP: BNP (last 3 results)  Basename 06/09/12 2123 05/15/12 0500  PROBNP 125.7* 8.9   CBG:  Lab 06/14/12 0723 06/13/12 2105 06/13/12 1628 06/13/12 1136 06/13/12 0743  GLUCAP 135* 118* 144* 134* 135*       Signed:  Rhetta Mura  [facility] 06/14/2012, 10:14 AM

## 2012-06-14 NOTE — Telephone Encounter (Signed)
Pt is aware of OV on 2/26 at 1030 with KJ and appt card was mailed

## 2012-06-14 NOTE — Telephone Encounter (Signed)
Pt to be D/C'd home Needs 4-6 wk FU re: set up colonoscopy, FU candida esophagitis Thanks

## 2012-06-14 NOTE — Telephone Encounter (Signed)
Spoke w/ SLF.  She wants pt seen in 8 weeks instead. Thanks

## 2012-06-14 NOTE — Progress Notes (Signed)
Discharge instructions and prescriptions given, verbalized understanding, out in stable condition ambulatory. 

## 2012-06-16 ENCOUNTER — Telehealth: Payer: Self-pay | Admitting: Gastroenterology

## 2012-06-16 NOTE — Telephone Encounter (Signed)
Please call pt. HIS ESOPHAGUS BIOPSIES SHOW YEAST IN HIS ESOPHAGUS. His stomach Bx shows gastritis. HE SHOULD COMPLETE HIS RX FOR DIFLUCAN. CONTINUE PROTONIX TWICE DAILY. OPV IN MAR 2014 E30 DX: DYSPHAGIA, ANEMIA.

## 2012-06-19 NOTE — Telephone Encounter (Signed)
Pt aware of results 

## 2012-06-19 NOTE — Telephone Encounter (Signed)
No PCP on file, appt made for Mar 2014

## 2012-06-20 ENCOUNTER — Ambulatory Visit (HOSPITAL_COMMUNITY): Payer: BC Managed Care – PPO

## 2012-06-23 ENCOUNTER — Ambulatory Visit (HOSPITAL_COMMUNITY)
Admission: RE | Admit: 2012-06-23 | Discharge: 2012-06-23 | Disposition: A | Discharge status: Home / Self Care | Payer: BC Managed Care – PPO | Source: Ambulatory Visit | Attending: Pulmonary Disease | Admitting: Pulmonary Disease

## 2012-06-23 DIAGNOSIS — R0609 Other forms of dyspnea: Secondary | ICD-10-CM | POA: Insufficient documentation

## 2012-06-23 DIAGNOSIS — J449 Chronic obstructive pulmonary disease, unspecified: Secondary | ICD-10-CM | POA: Insufficient documentation

## 2012-06-23 DIAGNOSIS — R0989 Other specified symptoms and signs involving the circulatory and respiratory systems: Secondary | ICD-10-CM | POA: Insufficient documentation

## 2012-06-23 DIAGNOSIS — J4489 Other specified chronic obstructive pulmonary disease: Secondary | ICD-10-CM | POA: Insufficient documentation

## 2012-06-23 MED ORDER — ALBUTEROL SULFATE (5 MG/ML) 0.5% IN NEBU
2.5000 mg | INHALATION_SOLUTION | Freq: Once | RESPIRATORY_TRACT | Status: AC
  Administered 2012-06-23: 2.5 mg via RESPIRATORY_TRACT

## 2012-07-04 ENCOUNTER — Emergency Department (HOSPITAL_COMMUNITY)
Admission: EM | Admit: 2012-07-04 | Discharge: 2012-07-04 | Disposition: A | Discharge status: Home / Self Care | Payer: BC Managed Care – PPO | Attending: Emergency Medicine | Admitting: Emergency Medicine

## 2012-07-04 ENCOUNTER — Emergency Department (HOSPITAL_COMMUNITY): Payer: BC Managed Care – PPO

## 2012-07-04 ENCOUNTER — Encounter (HOSPITAL_COMMUNITY): Payer: Self-pay | Admitting: *Deleted

## 2012-07-04 DIAGNOSIS — R059 Cough, unspecified: Secondary | ICD-10-CM | POA: Insufficient documentation

## 2012-07-04 DIAGNOSIS — R05 Cough: Secondary | ICD-10-CM | POA: Insufficient documentation

## 2012-07-04 DIAGNOSIS — J45901 Unspecified asthma with (acute) exacerbation: Secondary | ICD-10-CM | POA: Insufficient documentation

## 2012-07-04 DIAGNOSIS — F1010 Alcohol abuse, uncomplicated: Secondary | ICD-10-CM | POA: Insufficient documentation

## 2012-07-04 DIAGNOSIS — Z79899 Other long term (current) drug therapy: Secondary | ICD-10-CM | POA: Insufficient documentation

## 2012-07-04 DIAGNOSIS — M109 Gout, unspecified: Secondary | ICD-10-CM | POA: Insufficient documentation

## 2012-07-04 DIAGNOSIS — R062 Wheezing: Secondary | ICD-10-CM | POA: Insufficient documentation

## 2012-07-04 DIAGNOSIS — E7800 Pure hypercholesterolemia, unspecified: Secondary | ICD-10-CM | POA: Insufficient documentation

## 2012-07-04 DIAGNOSIS — Z87891 Personal history of nicotine dependence: Secondary | ICD-10-CM | POA: Insufficient documentation

## 2012-07-04 DIAGNOSIS — I10 Essential (primary) hypertension: Secondary | ICD-10-CM | POA: Insufficient documentation

## 2012-07-04 LAB — PRO B NATRIURETIC PEPTIDE: Pro B Natriuretic peptide (BNP): 51.6 pg/mL (ref 0–125)

## 2012-07-04 LAB — BASIC METABOLIC PANEL
GFR calc non Af Amer: >90 mL/min (ref >90)
Glucose, Bld: 120 mg/dL — ABNORMAL HIGH (ref 70–99)
Potassium: 4.4 mEq/L (ref 3.5–5.1)
Sodium: 142 mEq/L (ref 135–145)

## 2012-07-04 LAB — CBC WITH DIFFERENTIAL/PLATELET
Hemoglobin: 12.9 g/dL — ABNORMAL LOW (ref 13.0–17.0)
MCH: 30.7 pg (ref 26.0–34.0)
RBC: 4.2 MIL/uL — ABNORMAL LOW (ref 4.22–5.81)

## 2012-07-04 MED ORDER — ALBUTEROL SULFATE (5 MG/ML) 0.5% IN NEBU
5.0000 mg | INHALATION_SOLUTION | Freq: Once | RESPIRATORY_TRACT | Status: AC
  Administered 2012-07-04: 5 mg via RESPIRATORY_TRACT
  Filled 2012-07-04: qty 1

## 2012-07-04 MED ORDER — METHYLPREDNISOLONE SODIUM SUCC 125 MG IJ SOLR
125.0000 mg | Freq: Once | INTRAMUSCULAR | Status: AC
  Administered 2012-07-04: 125 mg via INTRAVENOUS
  Filled 2012-07-04: qty 2

## 2012-07-04 MED ORDER — ALBUTEROL SULFATE (5 MG/ML) 0.5% IN NEBU
INHALATION_SOLUTION | RESPIRATORY_TRACT | Status: AC
  Filled 2012-07-04: qty 2

## 2012-07-04 MED ORDER — IPRATROPIUM BROMIDE 0.02 % IN SOLN: 500.0000 ug | Freq: Four times a day (QID) | RESPIRATORY_TRACT | Status: DC

## 2012-07-04 MED ORDER — IPRATROPIUM BROMIDE 0.02 % IN SOLN
1.0000 mg | Freq: Once | RESPIRATORY_TRACT | Status: DC
  Filled 2012-07-04: qty 5

## 2012-07-04 MED ORDER — ALBUTEROL SULFATE (5 MG/ML) 0.5% IN NEBU
15.0000 mg | INHALATION_SOLUTION | Freq: Once | RESPIRATORY_TRACT | Status: AC
  Administered 2012-07-04: 15 mg via RESPIRATORY_TRACT
  Filled 2012-07-04: qty 1
  Filled 2012-07-04: qty 3

## 2012-07-04 MED ORDER — PREDNISONE 10 MG PO TABS: ORAL_TABLET | ORAL | Status: DC

## 2012-07-04 MED ORDER — IPRATROPIUM BROMIDE 0.02 % IN SOLN
0.5000 mg | Freq: Once | RESPIRATORY_TRACT | Status: AC
  Administered 2012-07-04: 0.5 mg via RESPIRATORY_TRACT
  Filled 2012-07-04: qty 2.5

## 2012-07-04 MED ORDER — AZITHROMYCIN 250 MG PO TABS: 250.0000 mg | ORAL_TABLET | Freq: Every day | ORAL | Status: DC

## 2012-07-04 MED ORDER — IPRATROPIUM BROMIDE 0.02 % IN SOLN
1.0000 mg | Freq: Once | RESPIRATORY_TRACT | Status: AC
  Administered 2012-07-04: 0.5 mg via RESPIRATORY_TRACT
  Filled 2012-07-04: qty 2.5

## 2012-07-04 NOTE — ED Notes (Signed)
Difficulty breathing with wheezing and cough x 3 days.  Using inhaler at home with no relief.

## 2012-07-04 NOTE — ED Notes (Signed)
Atrovent pulled as ordered and given to J. Idol, EDPa to give to pt for home use.

## 2012-07-04 NOTE — ED Notes (Signed)
Placed pt on O2 at 2L / min - sats now 96%

## 2012-07-04 NOTE — ED Provider Notes (Signed)
History    This chart was scribed for non-physician practitioner Burgess Amor, PA-C working with Laray Anger, DO by Gerlean Ren, ED Scribe. This patient was seen in room [facility]10/[facility]10 and the patient's care was started at 11:35 AM.    CSN: [id number]  Arrival date & time 07/04/12  1053   First MD Initiated Contact with Patient 07/04/12 1114      Chief Complaint  Patient presents with  . Shortness of Breath    The history is provided by the patient and medical records. No language interpreter was used.  Antonio Baker is a 57 y.o. male with h/o HTN and asthma who presents to the Emergency Department complaining of 3 days of gradual onset dyspnea that has been constant but noticeably worsening yesterday and this morning with associated wheezes and cough.  Pt reports using inhaler, last used this morning, with no relief.  Pt also reports noticing swelling in ankles that is not normal to baseline.  Pt denies chest pain, fever.  Pt has been here 4 times since 04/2012 with similar dyspnea and was treated with antibiotics last month for possible but unconfirmed pneumonia.  Pt states that before 04/2012 he never had any prior episodes of comparable dyspnea.   Pt is a former smoker (quit 2000) and reports alcohol use. Past Medical History  Diagnosis Date  . Hypertension   . Gout   . High cholesterol   . Asthma     Past Surgical History  Procedure Laterality Date  . Right knee arthroscopy    . Hernia repair    . Esophagogastroduodenoscopy  06/12/2012    Procedure: ESOPHAGOGASTRODUODENOSCOPY (EGD);  Surgeon: [place] Bali, MD;  Location: [facility];  Service: Endoscopy;  Laterality: N/A;    No family history on file.  History  Substance Use Topics  . Smoking status: Former Smoker -- 2.00 packs/day for 20 years    Types: Cigarettes    Quit date: 05/16/1999  . Smokeless tobacco: Not on file  . Alcohol Use: Yes      Review of Systems  Constitutional: Negative for fever.  HENT:  Negative for congestion, sore throat and neck pain.   Eyes: Negative.   Respiratory: Positive for cough, shortness of breath and wheezing. Negative for chest tightness.   Cardiovascular: Negative for chest pain.  Gastrointestinal: Negative for nausea and abdominal pain.  Genitourinary: Negative.   Musculoskeletal: Negative for joint swelling and arthralgias.  Skin: Negative.  Negative for rash and wound.  Neurological: Negative for dizziness, weakness, light-headedness, numbness and headaches.  Psychiatric/Behavioral: Negative.     Allergies  Review of patient's allergies indicates no known allergies.  Home Medications   Current Outpatient Rx  Name  Route  Sig  Dispense  Refill  . Acetaminophen (ARTHRITIS PAIN RELIEF PO)   Oral   Take 1 tablet by mouth daily as needed. For arthritis pain         . albuterol (PROVENTIL HFA;VENTOLIN HFA) 108 (90 BASE) MCG/ACT inhaler   Inhalation   Inhale 2 puffs into the lungs 3 (three) times daily. And every 4 hours as needed for wheezing, chest congestion, or shortness of breath.   1 Inhaler   0   . albuterol (PROVENTIL) (5 MG/ML) 0.5% nebulizer solution   Nebulization   Take 1 mL (5 mg total) by nebulization every 4 (four) hours as needed. For shortness of breath/wheezing   20 mL   0   . allopurinol (ZYLOPRIM) 300 MG tablet  Oral   Take 300 mg by mouth every morning.          . budesonide (PULMICORT) 0.5 MG/2ML nebulizer solution   Nebulization   Take 0.5 mg by nebulization 2 (two) times daily.         . carvedilol (COREG) 25 MG tablet   Oral   Take 25 mg by mouth 2 (two) times daily with a meal.         . cyclobenzaprine (FLEXERIL) 10 MG tablet   Oral   Take 10 mg by mouth 3 (three) times daily as needed. For spasms         . hydrochlorothiazide (MICROZIDE) 12.5 MG capsule   Oral   Take 2 capsules (25 mg total) by mouth daily.   30 capsule   0   . pantoprazole (PROTONIX) 40 MG tablet   Oral   Take 1 tablet (40  mg total) by mouth 2 (two) times daily before a meal.   60 tablet   0   . simvastatin (ZOCOR) 40 MG tablet   Oral   Take 40 mg by mouth every evening.         . valsartan (DIOVAN) 160 MG tablet   Oral   Take 160 mg by mouth daily.         [name] ipratropium (ATROVENT) 0.02 % nebulizer solution   Nebulization   Take 2.5 mLs (500 mcg total) by nebulization 4 (four) times daily.   75 mL   12   . predniSONE (DELTASONE) 10 MG tablet      6, 5, 4, 3, 2 then 1 tablet by mouth daily for 6 days total.   21 tablet   0     BP 185/113  Pulse 105  Temp(Src) 97.6 F (36.4 C) (Oral)  Resp 24  Ht 6\' 2"  (1.88 m)  Wt 220 lb (99.791 kg)  BMI 28.23 kg/m2  SpO2 96%  Physical Exam  Nursing note and vitals reviewed. Constitutional: He appears well-developed and well-nourished.  HENT:  Head: Normocephalic and atraumatic.  Eyes: Conjunctivae are normal.  Neck: Normal range of motion.  Cardiovascular: Normal rate, regular rhythm, normal heart sounds and intact distal pulses.   Pulmonary/Chest: Effort normal. He has wheezes.  Diffuse expiratory wheezing and prolonged expiratory phase, no rhonchi, no rales  Abdominal: Soft. Bowel sounds are normal. There is no tenderness.  Musculoskeletal: Normal range of motion.  Trace non-pitting bilateral ankle edema  Neurological: He is alert.  Skin: Skin is warm and dry.  Psychiatric: He has a normal mood and affect.    ED Course  Procedures (including critical care time) DIAGNOSTIC STUDIES: Oxygen Saturation is 96% on [place], adequate by my interpretation.    COORDINATION OF CARE: 11:47 AM- Patient informed of clinical course including chest XR and blood work, understands medical decision-making process, and agrees with plan.  Results for orders placed during the hospital encounter of 07/04/12  BASIC METABOLIC PANEL      Result Value Range   Sodium 142  135 - 145 mEq/L   Potassium 4.4  3.5 - 5.1 mEq/L   Chloride 105  96 - 112 mEq/L   CO2 28   19 - 32 mEq/L   Glucose, Bld 120 (*) 70 - 99 mg/dL   BUN 19  6 - 23 mg/dL   Creatinine, Ser 6.21  0.50 - 1.35 mg/dL   Calcium 30.8 (*) 8.4 - 10.5 mg/dL   GFR calc non Af Amer >90  >90 mL/min  GFR calc Af Amer >90  >90 mL/min  CBC WITH DIFFERENTIAL      Result Value Range   WBC 11.3 (*) 4.0 - 10.5 K/uL   RBC 4.20 (*) 4.22 - 5.81 MIL/uL   Hemoglobin 12.9 (*) 13.0 - 17.0 g/dL   HCT 16.1  09.6 - 04.5 %   MCV 93.3  78.0 - 100.0 fL   MCH 30.7  26.0 - 34.0 pg   MCHC 32.9  30.0 - 36.0 g/dL   RDW 40.9  81.1 - 91.4 %   Platelets 207  150 - 400 K/uL  PRO B NATRIURETIC PEPTIDE      Result Value Range   Pro B Natriuretic peptide (BNP) 51.6  0 - 125 pg/mL    Dg Chest Port 1 View  07/04/2012  *RADIOLOGY REPORT*  Clinical Data: Cough, shortness of breath, asthma.  PORTABLE CHEST - 1 VIEW  Comparison: 06/09/2012  Findings: Heart is normal size.  Stable mild peribronchial thickening and mild hyperinflation.  No effusions.  Right apical density, new since prior plain film and CT, question area of consolidation/pneumonia.  No acute bony abnormality.  IMPRESSION: New focal opacity in the right apex, question area of pneumonia.  Chronic bronchitic changes with hyperinflation.   Original Report Authenticated By: Charlett Nose, M.D.      1. Wheezing       MDM  Previous chart reviewed and hospital course for hospitalizations both in 12/13 and 1/14 for new onset copd thought possibly due to history of asbestos exposure.  He had pulmonary function tests last week and currently awaiting results.    Pt was given Iv solumedrol,  Albuterol 5 mg/atrovent 0.5 mg neb tx.  Then repeat 15 mg over 1 hour.  Pt had complete resolution of wheezing and no respiratory distress at time of discharge.  He ambulated in dept with no sob,  No return of wheezing, and pulse ox at 97%,  Patient very motivated to go home,  Although admission discussion when patient originally presented due to severity of wheezing. Discussed need for  immediate return for any worsened wheezing, sob,  Any new sx.  Patients labs and/or radiological studies were reviewed during the medical decision making and disposition process. Pt was placed on zithromax,  Order called on to his pharmacy.  He was also given several atrovent nebs to add to his home albuterol neb tx q 6 hours.     I personally performed the services described in this documentation, which was scribed in my presence. The recorded information has been reviewed and is accurate.     Date: 07/04/2012  Rate: 104  Rhythm: sinus tachycardia  QRS Axis: normal  Intervals: normal  ST/T Wave abnormalities: normal  Conduction Disutrbances:none  Narrative Interpretation:   Old EKG Reviewed: unchanged          Burgess Amor, PA 07/04/12 1648  Burgess Amor, PA 07/04/12 1658

## 2012-07-05 NOTE — ED Provider Notes (Signed)
Medical screening examination/treatment/procedure(s) were performed by non-physician practitioner and as supervising physician I was immediately available for consultation/collaboration.   Laray Anger, DO 07/05/12 1601

## 2012-07-13 NOTE — Procedures (Signed)
NAMETEVIN, SHILLINGFORD NO.:  [id number]  MEDICAL RECORD NO.:  [id number]  LOCATION:                                 FACILITY:  PHYSICIAN:  Zayvian Mcmurtry L. Juanetta Gosling, M.D.DATE OF BIRTH:  07/28/1955  DATE OF PROCEDURE:  07/12/2012 DATE OF DISCHARGE:                           PULMONARY FUNCTION TEST   Reason for pulmonary function testing is COPD; 1. Spirometry shows a mild ventilatory defect with evidence of airflow     obstruction. 2. Lung volumes are normal. 3. DLCO is mildly reduced. 4. Airway resistance is elevated confirming the presence of airflow     obstruction. 5. There is no significant bronchodilator improvement. 6. This study is consistent with the clinical diagnosis of COPD.     Eulalia Ellerman L. Juanetta Gosling, M.D.     EL[facility]/MEDQ  D:  07/12/2012  T:  07/13/2012  Job:  [id number]

## 2012-07-19 ENCOUNTER — Ambulatory Visit: Payer: BC Managed Care – PPO | Admitting: Urgent Care

## 2012-07-19 LAB — PULMONARY FUNCTION TEST

## 2012-08-09 ENCOUNTER — Ambulatory Visit: Payer: BC Managed Care – PPO | Admitting: Urgent Care

## 2012-08-14 ENCOUNTER — Encounter: Payer: Self-pay | Admitting: Gastroenterology

## 2012-08-16 ENCOUNTER — Ambulatory Visit: Payer: BC Managed Care – PPO | Admitting: Gastroenterology

## 2012-08-31 ENCOUNTER — Ambulatory Visit: Payer: BC Managed Care – PPO | Admitting: Gastroenterology

## 2012-08-31 ENCOUNTER — Telehealth: Payer: Self-pay | Admitting: Gastroenterology

## 2012-08-31 NOTE — Telephone Encounter (Signed)
Pt was a no show

## 2014-05-24 DIAGNOSIS — Z86711 Personal history of pulmonary embolism: Secondary | ICD-10-CM

## 2014-05-24 HISTORY — DX: Personal history of pulmonary embolism: Z86.711

## 2014-09-08 IMAGING — CR DG CHEST 1V PORT
1 series · 1 of 1 positions shown · non-contrast
Comparison: 05/14/2012

CLINICAL DATA: Severe dyspnea.

PORTABLE CHEST - 1 VIEW

[view not recorded]
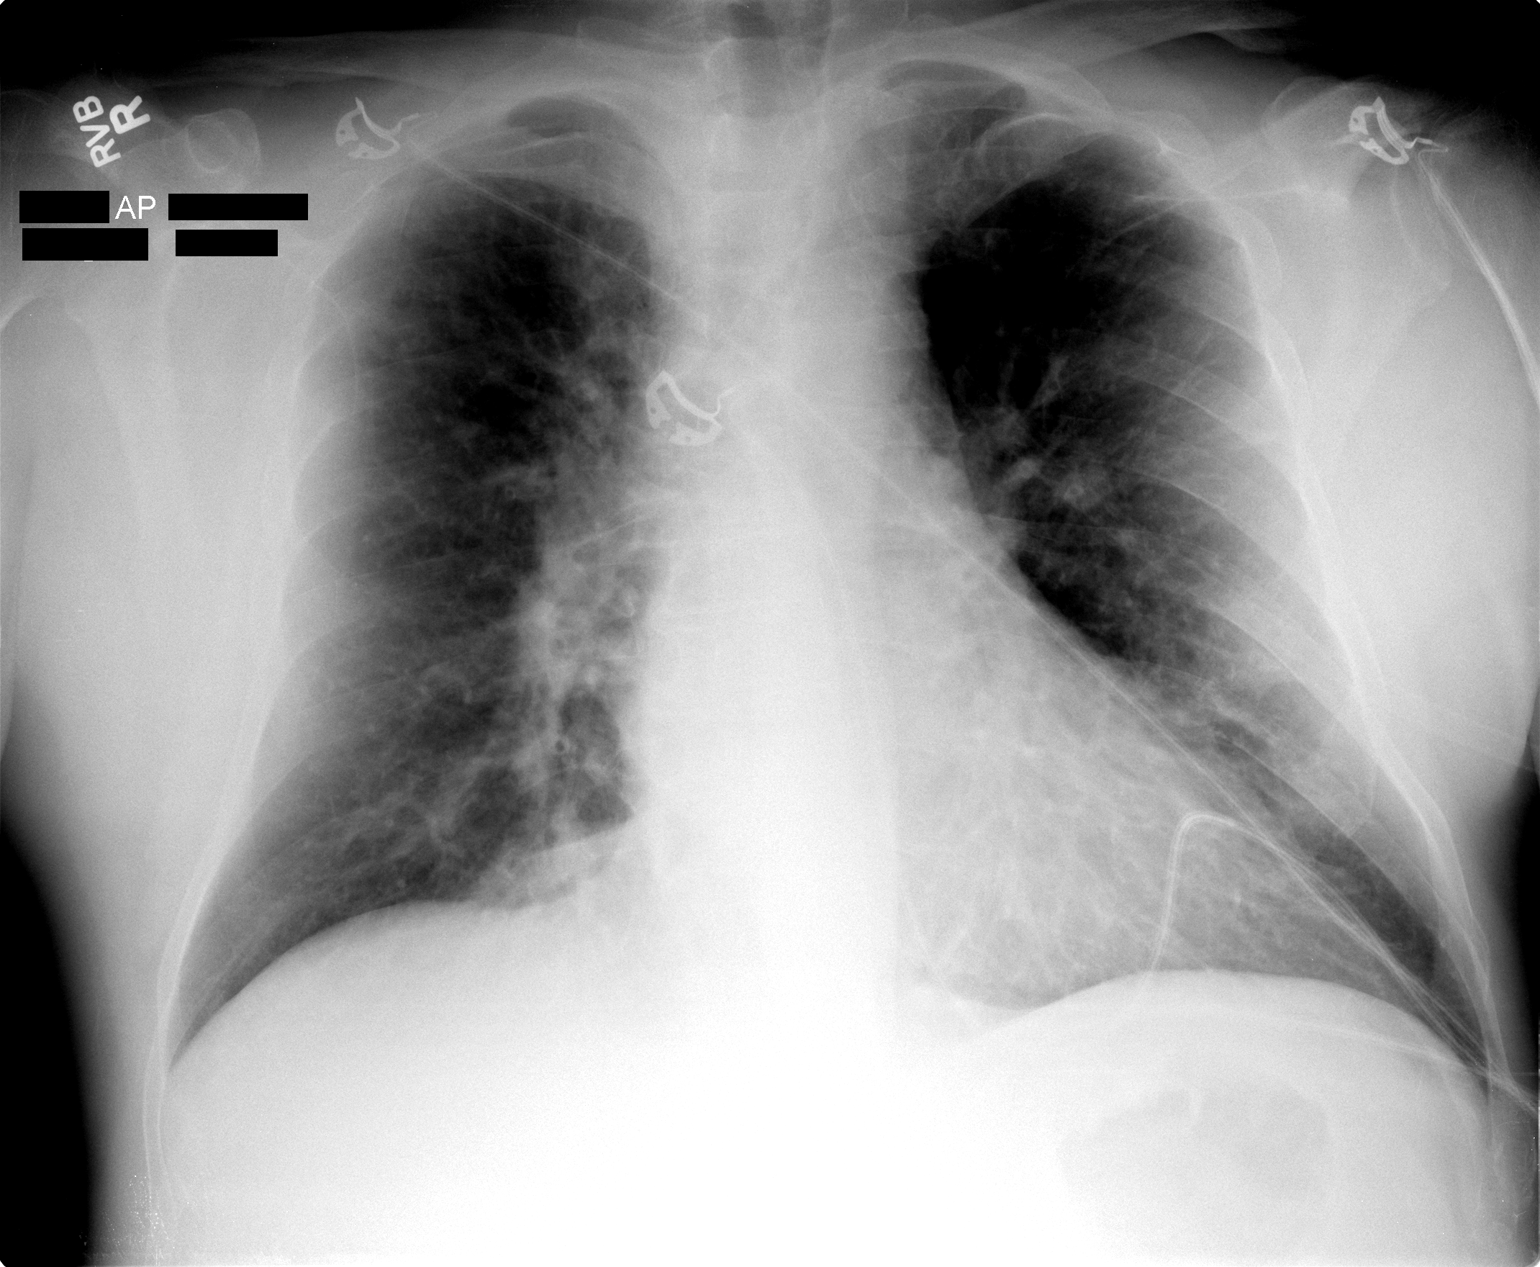

[1 of 1 positions shown; findings below may reference images not displayed]

FINDINGS: Borderline heart size with normal pulmonary vascularity.
Peribronchial thickening and perihilar infiltrates suggesting
chronic bronchitis.  No developing focal consolidation.  Suggestion
of bronchiectasis in the lung bases.  No pneumothorax.  Mediastinal
contours appear intact.
IMPRESSION: Chronic bronchitic changes with possible bronchiectasis in the
lungs.  No focal acute consolidation is identified.

## 2015-01-25 ENCOUNTER — Inpatient Hospital Stay (HOSPITAL_COMMUNITY)
Admission: EM | Admit: 2015-01-25 | Discharge: 2015-01-27 | DRG: 190 | Disposition: A | Discharge status: Home / Self Care | Payer: Medicare (Managed Care) | Attending: Family Medicine | Admitting: Family Medicine

## 2015-01-25 ENCOUNTER — Emergency Department (HOSPITAL_COMMUNITY): Payer: Medicare (Managed Care)

## 2015-01-25 ENCOUNTER — Encounter (HOSPITAL_COMMUNITY): Payer: Self-pay | Admitting: Emergency Medicine

## 2015-01-25 DIAGNOSIS — F17200 Nicotine dependence, unspecified, uncomplicated: Secondary | ICD-10-CM | POA: Insufficient documentation

## 2015-01-25 DIAGNOSIS — E785 Hyperlipidemia, unspecified: Secondary | ICD-10-CM | POA: Diagnosis present

## 2015-01-25 DIAGNOSIS — I10 Essential (primary) hypertension: Secondary | ICD-10-CM | POA: Diagnosis present

## 2015-01-25 DIAGNOSIS — E7800 Pure hypercholesterolemia, unspecified: Secondary | ICD-10-CM | POA: Diagnosis present

## 2015-01-25 DIAGNOSIS — J45901 Unspecified asthma with (acute) exacerbation: Secondary | ICD-10-CM | POA: Diagnosis present

## 2015-01-25 DIAGNOSIS — R0902 Hypoxemia: Secondary | ICD-10-CM

## 2015-01-25 DIAGNOSIS — R000 Tachycardia, unspecified: Secondary | ICD-10-CM | POA: Diagnosis present

## 2015-01-25 DIAGNOSIS — J441 Chronic obstructive pulmonary disease with (acute) exacerbation: Principal | ICD-10-CM | POA: Diagnosis present

## 2015-01-25 DIAGNOSIS — J9601 Acute respiratory failure with hypoxia: Secondary | ICD-10-CM | POA: Diagnosis present

## 2015-01-25 LAB — BASIC METABOLIC PANEL
ANION GAP: 7 (ref 5–15)
BUN: 14 mg/dL (ref 6–20)
CALCIUM: 10.1 mg/dL (ref 8.9–10.3)
CO2: 29 mmol/L (ref 22–32)
Chloride: 101 mmol/L (ref 101–111)
Creatinine, Ser: 0.77 mg/dL (ref 0.61–1.24)
Glucose, Bld: 114 mg/dL — ABNORMAL HIGH (ref 65–99)
POTASSIUM: 3.9 mmol/L (ref 3.5–5.1)
Sodium: 137 mmol/L (ref 135–145)

## 2015-01-25 LAB — BLOOD GAS, ARTERIAL
ACID-BASE EXCESS: 3.8 mmol/L — AB (ref 0.0–2.0)
BICARBONATE: 27.5 meq/L — AB (ref 20.0–24.0)
Drawn by: 382351
FIO2: 0.3
O2 Saturation: 95.1 %
PATIENT TEMPERATURE: 37
PCO2 ART: 43.8 mmHg (ref 35.0–45.0)
PH ART: 7.422 (ref 7.350–7.450)
TCO2: 17.3 mmol/L (ref 0–100)
pO2, Arterial: 75.6 mmHg — ABNORMAL LOW (ref 80.0–100.0)

## 2015-01-25 LAB — CBC WITH DIFFERENTIAL/PLATELET
BASOS ABS: 0 10*3/uL (ref 0.0–0.1)
BASOS PCT: 0 % (ref 0–1)
Eosinophils Absolute: 1.2 10*3/uL — ABNORMAL HIGH (ref 0.0–0.7)
Eosinophils Relative: 8 % — ABNORMAL HIGH (ref 0–5)
HEMATOCRIT: 40.5 % (ref 39.0–52.0)
Hemoglobin: 13.4 g/dL (ref 13.0–17.0)
LYMPHS PCT: 21 % (ref 12–46)
Lymphs Abs: 3.1 10*3/uL (ref 0.7–4.0)
MCH: 31.8 pg (ref 26.0–34.0)
MCHC: 33.1 g/dL (ref 30.0–36.0)
MCV: 96.2 fL (ref 78.0–100.0)
MONO ABS: 1.3 10*3/uL — AB (ref 0.1–1.0)
Monocytes Relative: 9 % (ref 3–12)
NEUTROS ABS: 9.1 10*3/uL — AB (ref 1.7–7.7)
Neutrophils Relative %: 62 % (ref 43–77)
PLATELETS: 191 10*3/uL (ref 150–400)
RBC: 4.21 MIL/uL — AB (ref 4.22–5.81)
RDW: 13.1 % (ref 11.5–15.5)
WBC: 14.6 10*3/uL — AB (ref 4.0–10.5)

## 2015-01-25 LAB — BRAIN NATRIURETIC PEPTIDE: B NATRIURETIC PEPTIDE 5: 23 pg/mL (ref 0.0–100.0)

## 2015-01-25 LAB — TROPONIN I: Troponin I: <0.03 ng/mL (ref <0.031)

## 2015-01-25 MED ORDER — IRBESARTAN 150 MG PO TABS
150.0000 mg | ORAL_TABLET | Freq: Every day | ORAL | Status: DC
  Administered 2015-01-25 – 2015-01-27 (×3): 150 mg via ORAL
  Filled 2015-01-25 (×3): qty 1

## 2015-01-25 MED ORDER — ALBUTEROL (5 MG/ML) CONTINUOUS INHALATION SOLN
15.0000 mg | INHALATION_SOLUTION | RESPIRATORY_TRACT | Status: DC
  Administered 2015-01-25: 15 mg via RESPIRATORY_TRACT

## 2015-01-25 MED ORDER — GUAIFENESIN-DM 100-10 MG/5ML PO SYRP
5.0000 mL | ORAL_SOLUTION | ORAL | Status: DC | PRN
  Administered 2015-01-25 – 2015-01-26 (×4): 5 mL via ORAL
  Filled 2015-01-25 (×4): qty 5

## 2015-01-25 MED ORDER — CETYLPYRIDINIUM CHLORIDE 0.05 % MT LIQD
7.0000 mL | Freq: Two times a day (BID) | OROMUCOSAL | Status: DC
  Administered 2015-01-25 – 2015-01-26 (×3): 7 mL via OROMUCOSAL

## 2015-01-25 MED ORDER — GUAIFENESIN ER 600 MG PO TB12: 600.0000 mg | ORAL_TABLET | Freq: Two times a day (BID) | ORAL | Status: DC

## 2015-01-25 MED ORDER — ACETAMINOPHEN 325 MG PO TABS
650.0000 mg | ORAL_TABLET | Freq: Four times a day (QID) | ORAL | Status: DC | PRN
  Administered 2015-01-25: 650 mg via ORAL
  Filled 2015-01-25: qty 2

## 2015-01-25 MED ORDER — ALBUTEROL SULFATE (2.5 MG/3ML) 0.083% IN NEBU
2.5000 mg | INHALATION_SOLUTION | RESPIRATORY_TRACT | Status: DC | PRN
  Filled 2015-01-25: qty 3

## 2015-01-25 MED ORDER — METHYLPREDNISOLONE SODIUM SUCC 125 MG IJ SOLR
80.0000 mg | Freq: Two times a day (BID) | INTRAMUSCULAR | Status: DC
  Administered 2015-01-25 – 2015-01-26 (×3): 80 mg via INTRAVENOUS
  Filled 2015-01-25 (×3): qty 2

## 2015-01-25 MED ORDER — CYCLOBENZAPRINE HCL 10 MG PO TABS
10.0000 mg | ORAL_TABLET | Freq: Three times a day (TID) | ORAL | Status: DC | PRN
  Administered 2015-01-26 (×2): 10 mg via ORAL
  Filled 2015-01-25 (×2): qty 1

## 2015-01-25 MED ORDER — SODIUM CHLORIDE 0.9 % IJ SOLN
3.0000 mL | Freq: Two times a day (BID) | INTRAMUSCULAR | Status: DC
  Administered 2015-01-26 (×3): 3 mL via INTRAVENOUS

## 2015-01-25 MED ORDER — ALBUTEROL (5 MG/ML) CONTINUOUS INHALATION SOLN: 10.0000 mg | INHALATION_SOLUTION | RESPIRATORY_TRACT | Status: DC

## 2015-01-25 MED ORDER — SODIUM CHLORIDE 0.9 % IV SOLN: 250.0000 mL | INTRAVENOUS | Status: DC | PRN

## 2015-01-25 MED ORDER — IPRATROPIUM BROMIDE 0.02 % IN SOLN
0.5000 mg | Freq: Once | RESPIRATORY_TRACT | Status: AC
  Administered 2015-01-25: 0.5 mg via RESPIRATORY_TRACT

## 2015-01-25 MED ORDER — SODIUM CHLORIDE 0.9 % IJ SOLN: 3.0000 mL | INTRAMUSCULAR | Status: DC | PRN

## 2015-01-25 MED ORDER — HYDROCODONE-ACETAMINOPHEN 10-325 MG PO TABS
1.0000 | ORAL_TABLET | Freq: Four times a day (QID) | ORAL | Status: DC | PRN
  Administered 2015-01-25 – 2015-01-27 (×8): 1 via ORAL
  Filled 2015-01-25 (×8): qty 1

## 2015-01-25 MED ORDER — IPRATROPIUM BROMIDE 0.02 % IN SOLN
RESPIRATORY_TRACT | Status: AC
  Filled 2015-01-25: qty 2.5

## 2015-01-25 MED ORDER — SODIUM CHLORIDE 0.9 % IJ SOLN
3.0000 mL | Freq: Two times a day (BID) | INTRAMUSCULAR | Status: DC
  Administered 2015-01-25 – 2015-01-26 (×3): 3 mL via INTRAVENOUS

## 2015-01-25 MED ORDER — IPRATROPIUM BROMIDE 0.02 % IN SOLN: 0.5000 mg | Freq: Four times a day (QID) | RESPIRATORY_TRACT | Status: DC

## 2015-01-25 MED ORDER — IPRATROPIUM-ALBUTEROL 0.5-2.5 (3) MG/3ML IN SOLN
3.0000 mL | Freq: Four times a day (QID) | RESPIRATORY_TRACT | Status: DC
  Administered 2015-01-25 – 2015-01-27 (×9): 3 mL via RESPIRATORY_TRACT
  Filled 2015-01-25 (×9): qty 3

## 2015-01-25 MED ORDER — ALBUTEROL (5 MG/ML) CONTINUOUS INHALATION SOLN
INHALATION_SOLUTION | RESPIRATORY_TRACT | Status: AC
  Filled 2015-01-25: qty 20

## 2015-01-25 MED ORDER — GABAPENTIN 300 MG PO CAPS
300.0000 mg | ORAL_CAPSULE | Freq: Two times a day (BID) | ORAL | Status: DC
  Administered 2015-01-25 – 2015-01-27 (×5): 300 mg via ORAL
  Filled 2015-01-25 (×5): qty 1

## 2015-01-25 MED ORDER — SIMVASTATIN 20 MG PO TABS
40.0000 mg | ORAL_TABLET | Freq: Every evening | ORAL | Status: DC
  Administered 2015-01-25 – 2015-01-26 (×2): 40 mg via ORAL
  Filled 2015-01-25 (×2): qty 2

## 2015-01-25 MED ORDER — METHYLPREDNISOLONE SODIUM SUCC 125 MG IJ SOLR
125.0000 mg | Freq: Once | INTRAMUSCULAR | Status: AC
  Administered 2015-01-25: 125 mg via INTRAVENOUS
  Filled 2015-01-25: qty 2

## 2015-01-25 MED ORDER — ALLOPURINOL 300 MG PO TABS
300.0000 mg | ORAL_TABLET | Freq: Every morning | ORAL | Status: DC
  Administered 2015-01-25 – 2015-01-27 (×3): 300 mg via ORAL
  Filled 2015-01-25 (×3): qty 1

## 2015-01-25 MED ORDER — IPRATROPIUM-ALBUTEROL 0.5-2.5 (3) MG/3ML IN SOLN
3.0000 mL | RESPIRATORY_TRACT | Status: DC
  Administered 2015-01-25: 3 mL via RESPIRATORY_TRACT
  Filled 2015-01-25: qty 3

## 2015-01-25 NOTE — ED Notes (Signed)
Patient complaining of shortness of breath x approximately 2 weeks, worsening tonight. Reports recently saw pcp and treated with z-pack.

## 2015-01-25 NOTE — Progress Notes (Signed)
PROGRESS NOTE  Antonio Baker TOI:[id number] DOB: [birth year] DOA: 01/25/2015 PCP: Elyn Aquas    Summary: 59yom PMH COPD, asthma, former smoker, presented with 3 week h/o SOB, wheezing, treated with azithromycin as an outpatient, no steroids. Using nebs and albuterol inhaler. In ED hypoxic on RA, admitted for acute hypoxic respiratory failure and COPD exacerbation.   Assessment/Plan: 1. Acute hypoxic/hypoxemic respiratory failure secondary to COPD exacerbation.  2. COPD exacerbation.Still with active wheezing. Just finished azithromycin, no further abx.  3. Tobacco dependence in remission, quit 2001 4. Chronic leukocytosis, f/u as an outpatient.   No significant improvement. Still with active wheezing will continue steroids, nebs, supplemental oxygen, and cough medications.   Wean O2 as tolerated  D/c telemetry.  Home likely next 48 hours  Code Status: full code DVT prophylaxis: SCDs Family Communication: Discussed with patient who understands and has no concerns at this time. Disposition Plan: home next 48 hours.   Murray Hodgkins, MD  [facility]  Pager [phone] If 7PM-7AM, please contact night-coverage at [email or web address], password [facility] 01/25/2015, 10:01 AM  LOS: 0 days   Consultants:    Procedures:    Antibiotics:    HPI/Subjective: Is feeling better then yesterday. Did not get much rest. Reports being home last night and his chest was getting increasingly heavy, dry cough, SOB and noticeable wheezing. He denies nausea, vomiting, difficulty eating and productive cough.   Objective: Filed Vitals:   01/25/15 0355 01/25/15 0400 01/25/15 0453 01/25/15 0836  BP:  167/97 165/90   Pulse:  102 96   Temp:   98.6 F (37 C)   TempSrc:   Oral   Resp:  14 16   Height:   6\' 2"  (1.88 m)   Weight:   104.509 kg (230 lb 6.4 oz)   SpO2: 95% 95% 91% 98%    Intake/Output Summary (Last 24 hours) at 01/25/15 1001 Last data filed at 01/25/15 0831  Gross per 24 hour   Intake    480 ml  Output      0 ml  Net    480 ml     Filed Weights   01/25/15 0235 01/25/15 0453  Weight: 100.699 kg (222 lb) 104.509 kg (230 lb 6.4 oz)    Exam:  Afebrile, VSS General:  Appears calm and comfortable Eyes: PERRL, normal lids, irises & conjunctiva ENT: grossly normal hearing, lips & tongue Cardiovascular: No m/r/g. No LE edema.Tachycardic, regular rhythm.  Telemetry: SR/ST Respiratory: Bilateral expiratory wheezing, all lung fields. Mild increased respiratory efforts but is able to speak in full sentence. Pursed lip breathing. No r/r Abdomen: soft, ntnd Psychiatric: grossly normal mood and affect, speech fluent and appropriate  New data reviewed:  CMP unremarkable  Pertinent data since admission:  CXR IMPRESSION: No acute cardiopulmonary findings.  ABG 7.422/43/75  Troponin, BNP, BMP unremarkable.   EKG ST  Pending data:    Scheduled Meds: . antiseptic oral rinse  7 mL Mouth Rinse BID  . guaiFENesin  600 mg Oral BID  . ipratropium-albuterol  3 mL Nebulization Q6H  . methylPREDNISolone (SOLU-MEDROL) injection  80 mg Intravenous Q12H  . sodium chloride  3 mL Intravenous Q12H  . sodium chloride  3 mL Intravenous Q12H   Continuous Infusions:   Principal Problem:   Acute respiratory failure with hypoxia Active Problems:   Hypertension   Tachycardia   COPD exacerbation   High cholesterol   Time spent 25 minutes  I, Jessica D. Leonie Green, acting as scribe, recorded this note contemporaneously in  the presence of Dr. Melene Plan. Sarajane Jews, M.D. on 01/25/2015 .  I have reviewed the above documentation for accuracy and completeness, and I agree with the above. Murray Hodgkins, MD

## 2015-01-25 NOTE — ED Notes (Signed)
Pt states feeling better than when he came in. Pt still has audible wheezing noted, better on inspiration after cont neb, still has expiatory wheezing through out

## 2015-01-25 NOTE — ED Provider Notes (Signed)
TIME SEEN: 2:35 AM  CHIEF COMPLAINT: Shortness of breath, wheezing  HPI: Pt is a 59 y.o. male with history of hypertension, hyperlipidemia, asthma/COPD who quit smoking in 2001 who presents to the emergency department with 3 weeks of shortness of breath, wheezing that has been progressively worsening and subsequently worsened significantly tonight despite breathing treatments at home. In the emergency department patient is hypoxic. Reports he does not wear oxygen at home. Denies chest pain or chest discomfort. Has had a cough with white sputum production. No lower extremity swelling or pain. No history of PE or DVT. No known history of CHF. Has never been intubated for his asthma/COPD. He is not sure what triggered his symptoms. Was recently treated with a Z-Pak by his primary care physician in [place] without relief. Has not been on oral steroids.  PCP - Dr. Monia Pouch? In [place]: See HPI Constitutional: no fever  Eyes: no drainage  ENT: no runny nose   Cardiovascular:  no chest pain  Resp:  SOB  GI: no vomiting GU: no dysuria Integumentary: no rash  Allergy: no hives  Musculoskeletal: no leg swelling  Neurological: no slurred speech ROS otherwise negative  PAST MEDICAL HISTORY/PAST SURGICAL HISTORY:  Past Medical History  Diagnosis Date  . Hypertension   . Gout   . High cholesterol   . Asthma     MEDICATIONS:  Prior to Admission medications   Medication Sig Start Date End Date Taking? Authorizing Provider  Acetaminophen (ARTHRITIS PAIN RELIEF PO) Take 1 tablet by mouth daily as needed. For arthritis pain    Historical Provider, MD  albuterol (PROVENTIL HFA;VENTOLIN HFA) 108 (90 BASE) MCG/ACT inhaler Inhale 2 puffs into the lungs 3 (three) times daily. And every 4 hours as needed for wheezing, chest congestion, or shortness of breath. 05/17/12   [name], MD  albuterol (PROVENTIL) (5 MG/ML) 0.5% nebulizer solution Take 1 mL (5 mg total) by nebulization every 4 (four)  hours as needed. For shortness of breath/wheezing 06/14/12   [name], MD  allopurinol (ZYLOPRIM) 300 MG tablet Take 300 mg by mouth every morning.     Historical Provider, MD  azithromycin (ZITHROMAX) 250 MG tablet Take 1 tablet (250 mg total) by mouth daily. Take first 2 tablets together, then 1 every day until finished. 07/04/12   [name], PA-C  budesonide (PULMICORT) 0.5 MG/2ML nebulizer solution Take 0.5 mg by nebulization 2 (two) times daily.    Historical Provider, MD  carvedilol (COREG) 25 MG tablet Take 25 mg by mouth 2 (two) times daily with a meal.    Historical Provider, MD  cyclobenzaprine (FLEXERIL) 10 MG tablet Take 10 mg by mouth 3 (three) times daily as needed. For spasms    Historical Provider, MD  hydrochlorothiazide (MICROZIDE) 12.5 MG capsule Take 2 capsules (25 mg total) by mouth daily. 06/14/12   [name], MD  ipratropium (ATROVENT) 0.02 % nebulizer solution Take 2.5 mLs (500 mcg total) by nebulization 4 (four) times daily. 07/04/12   [name], PA-C  pantoprazole (PROTONIX) 40 MG tablet Take 1 tablet (40 mg total) by mouth 2 (two) times daily before a meal. 06/14/12   [name], MD  predniSONE (DELTASONE) 10 MG tablet 6, 5, 4, 3, 2 then 1 tablet by mouth daily for 6 days total. 07/04/12   [name], PA-C  simvastatin (ZOCOR) 40 MG tablet Take 40 mg by mouth every evening.    Historical Provider, MD  valsartan (DIOVAN) 160 MG tablet Take 160 mg  by mouth daily.    Historical Provider, MD    ALLERGIES:  No Known Allergies  SOCIAL HISTORY:  Social History  Substance Use Topics  . Smoking status: Former Smoker -- 2.00 packs/day for 20 years    Types: Cigarettes    Quit date: 05/16/1999  . Smokeless tobacco: Not on file  . Alcohol Use: Yes    FAMILY HISTORY: History reviewed. No pertinent family history.  EXAM: BP 204/116 mmHg  Pulse 122  Temp(Src) 98.9 F (37.2 C) (Oral)  Resp 38  Ht 6\' 2"  (1.88 m)  Wt 222 lb (100.699 kg)  BMI  28.49 kg/m2  SpO2 87% CONSTITUTIONAL: Alert and oriented and responds appropriately to questions. Chronically ill-appearing, moderate respiratory distress HEAD: Normocephalic EYES: Conjunctivae clear, PERRL ENT: normal nose; no rhinorrhea; moist mucous membranes; pharynx without lesions noted NECK: Supple, no meningismus, no LAD  CARD: regular and tachycardic; S1 and S2 appreciated; no murmurs, no clicks, no rubs, no gallops RESP: Normal chest excursion without splinting, patient is tachypneic, diffuse expiratory wheezing and inspiratory wheezing, diminished at his bases bilaterally, no rales or rhonchi appreciated, patient is hypoxic on room air, increased work of breathing, is able to speak short sentences and is in mild to moderate respiratory distress ABD/GI: Normal bowel sounds; non-distended; soft, non-tender, no rebound, no guarding, no peritoneal signs BACK:  The back appears normal and is non-tender to palpation, there is no CVA tenderness EXT: Normal ROM in all joints; non-tender to palpation; no edema; normal capillary refill; no cyanosis, no calf tenderness or swelling    SKIN: Normal color for age and race; warm NEURO: Moves all extremities equally, sensation to light touch intact diffusely, cranial nerves II through XII intact  MEDICAL DECISION MAKING: Patient here with shortness of breath, wheezing. Likely asthma/COPD exacerbation. He does have a new oxygen requirement. We'll give continuous albuterol, Atrovent, Solu-Medrol. We'll obtain labs, EKG, ABG and a portable chest x-ray. Anticipate the patient will need admission.  ED PROGRESS: 3:45 AM  Pt still hypoxic on room air and his oxygen saturation only, 86% at rest. On 2 L his sats are 96-98%. Heart rate and rest her rate have improved. He is now moving air much better and his wheezing has also improved but not completely gone. He has received 15mg  albuterol, 0.5 mg atrovent.  Will give a duoneb and repeat every 4 hours.  Patient  now speaking full sentences. Chest x-ray clear with no edema, infiltrate. ABG shows hypoxia but no hypercarbia. pH is normal. Patient does have a leukocytosis but this appears to be chronic for the patient.  Otherwise labs are unremarkable. Troponin negative. BNP normal.   4:00 AM  D/w Dr. Shanon Brow with hospitalist service. Will admit to telemetry, inpatient.   CRITICAL CARE Performed by: Nyra Jabs   Total critical care time: 35 minutes  Critical care time was exclusive of separately billable procedures and treating other patients.  Critical care was necessary to treat or prevent imminent or life-threatening deterioration.  Critical care was time spent personally by me on the following activities: development of treatment plan with patient and/or surrogate as well as nursing, discussions with consultants, evaluation of patient's response to treatment, examination of patient, obtaining history from patient or surrogate, ordering and performing treatments and interventions, ordering and review of laboratory studies, ordering and review of radiographic studies, pulse oximetry and re-evaluation of patient's condition.      EKG Interpretation  Date/Time:  Saturday January 25 2015 02:39:40 EDT Ventricular Rate:  119 PR Interval:  149 QRS Duration: 86 QT Interval:  309 QTC Calculation: 435 R Axis:   72 Text Interpretation:  Sinus tachycardia Artifact in lead(s) V4 No significant change since last tracing in 2014 Confirmed by Freeda Spivey,  DO, Charitie Hinote [id number]) on 01/25/2015 2:54:33 AM        [place], DO 01/25/15 0401

## 2015-01-25 NOTE — ED Notes (Signed)
   01/25/15 0253  Respiratory  Respiratory (WDL) X  Bilateral Breath Sounds Diminished;Expiratory wheezes;Inspiratory wheezes  Respiratory Pattern Regular;Symmetrical;Prolonged exhalation  O2 Device Nasal Cannula  O2 Flow Rate (L/min) 2 L/min  pt states SOB for a few weeks, finished antibiotics PCP gave. Using breathing tx at home. Reports productive cough.

## 2015-01-25 NOTE — H&P (Signed)
PCP:   No primary care provider on file.   Chief Complaint:  Sob, wheezing  HPI: 59 yo male h/o lifelong asthma, nonsmoker, htn comes in with several weeks of sob, wheezing.  Has been to his doctor given a zpack but no steroids.  He says his inhalers were stopped about 2 years ago.  He has been doing very well for that long, until about a month ago.  Has not been on any recent steroids.  He does have alb inhaler and neb at home, which he has been using.  Today he got much worse, sob was worse and he was wheezing a lot so came to the ED.  Nonproductive cough, no fever.  Given solumedrol and several nebs in the ED and is much improoved.  Oxygen sats were 86% on RA initially.  Referred for admission for asthma exacerbation.  Review of Systems:  Positive and negative as per HPI otherwise all other systems are negative  Past Medical History: Past Medical History  Diagnosis Date  . Hypertension   . Gout   . High cholesterol   . Asthma    Past Surgical History  Procedure Laterality Date  . Right knee arthroscopy    . Hernia repair    . Esophagogastroduodenoscopy  06/12/2012    WUJ:WJXBJ hiatal hernia/WHITE PLAQUES MOST LIKELY DUE TO CANDIDA ESOPHAGITIS/Single ulcer at the gastroesophageal junction-MOST LIKELY CAUSE FOR ODYNOPHAGIA/Stricture  at the gastroesophageal junction    Medications: Prior to Admission medications   Medication Sig Start Date End Date Taking? Authorizing Provider  HYDROcodone-acetaminophen (NORCO) 10-325 MG per tablet Take 1 tablet by mouth every 6 (six) hours as needed.   Yes Historical Provider, MD  Acetaminophen (ARTHRITIS PAIN RELIEF PO) Take 1 tablet by mouth daily as needed. For arthritis pain    Historical Provider, MD  albuterol (PROVENTIL HFA;VENTOLIN HFA) 108 (90 BASE) MCG/ACT inhaler Inhale 2 puffs into the lungs 3 (three) times daily. And every 4 hours as needed for wheezing, chest congestion, or shortness of breath. 05/17/12   [name], MD   albuterol (PROVENTIL) (5 MG/ML) 0.5% nebulizer solution Take 1 mL (5 mg total) by nebulization every 4 (four) hours as needed. For shortness of breath/wheezing 06/14/12   [name], MD  allopurinol (ZYLOPRIM) 300 MG tablet Take 300 mg by mouth every morning.     Historical Provider, MD  azithromycin (ZITHROMAX) 250 MG tablet Take 1 tablet (250 mg total) by mouth daily. Take first 2 tablets together, then 1 every day until finished. 07/04/12   [name], PA-C  budesonide (PULMICORT) 0.5 MG/2ML nebulizer solution Take 0.5 mg by nebulization 2 (two) times daily.    Historical Provider, MD  carvedilol (COREG) 25 MG tablet Take 25 mg by mouth 2 (two) times daily with a meal.    Historical Provider, MD  cyclobenzaprine (FLEXERIL) 10 MG tablet Take 10 mg by mouth 3 (three) times daily as needed. For spasms    Historical Provider, MD  hydrochlorothiazide (MICROZIDE) 12.5 MG capsule Take 2 capsules (25 mg total) by mouth daily. 06/14/12   [name], MD  ipratropium (ATROVENT) 0.02 % nebulizer solution Take 2.5 mLs (500 mcg total) by nebulization 4 (four) times daily. 07/04/12   [name], PA-C  pantoprazole (PROTONIX) 40 MG tablet Take 1 tablet (40 mg total) by mouth 2 (two) times daily before a meal. 06/14/12   [name], MD  predniSONE (DELTASONE) 10 MG tablet 6, 5, 4, 3, 2 then 1 tablet by mouth daily for 6  days total. 07/04/12   [name], PA-C  simvastatin (ZOCOR) 40 MG tablet Take 40 mg by mouth every evening.    Historical Provider, MD  valsartan (DIOVAN) 160 MG tablet Take 160 mg by mouth daily.    Historical Provider, MD    Allergies:  No Known Allergies  Social History:  reports that he quit smoking about 15 years ago. His smoking use included Cigarettes. He has a 40 pack-year smoking history. He does not have any smokeless tobacco history on file. He reports that he drinks alcohol. He reports that he does not use illicit drugs.  Family History: No CAD  Physical  Exam: Filed Vitals:   01/25/15 0345 01/25/15 0355 01/25/15 0400 01/25/15 0453  BP:   167/97 165/90  Pulse: 107  102 96  Temp:    98.6 F (37 C)  TempSrc:    Oral  Resp: 20  14 16   Height:    6\' 2"  (1.88 m)  Weight:    104.509 kg (230 lb 6.4 oz)  SpO2: 94% 95% 95% 91%   General appearance: alert, cooperative and no distress Head: Normocephalic, without obvious abnormality, atraumatic Eyes: negative Nose: Nares normal. Septum midline. Mucosa normal. No drainage or sinus tenderness. Neck: no JVD and supple, symmetrical, trachea midline Lungs: diminished breath sounds bilaterally and wheezes bilaterally Heart: regular rate and rhythm, S1, S2 normal, no murmur, click, rub or gallop Abdomen: soft, non-tender; bowel sounds normal; no masses,  no organomegaly Extremities: extremities normal, atraumatic, no cyanosis or edema Pulses: 2+ and symmetric Skin: Skin color, texture, turgor normal. No rashes or lesions Neurologic: Grossly normal    Labs on Admission:   Recent Labs  01/25/15 0247  NA 137  K 3.9  CL 101  CO2 29  GLUCOSE 114*  BUN 14  CREATININE 0.77  CALCIUM 10.1    Recent Labs  01/25/15 0247  WBC 14.6*  NEUTROABS 9.1*  HGB 13.4  HCT 40.5  MCV 96.2  PLT 191    Recent Labs  01/25/15 0247  TROPONINI <0.03   Radiological Exams on Admission: Dg Chest Port 1 View  01/25/2015   CLINICAL DATA:  Dyspnea for approximately 2 weeks, worse tonight.  EXAM: PORTABLE CHEST - 1 VIEW  COMPARISON:  07/04/2012  FINDINGS: Mild unchanged hyperinflation with mild chronic appearing interstitial coarsening. No superimposed acute findings are evident. No large effusions. No pneumothorax.  IMPRESSION: No acute cardiopulmonary findings.   Electronically Signed   By: Andreas Newport M.D.   On: 01/25/2015 03:12   cxr revewed and no infiltrate or edema Old record reviewed Case discussed with ed doctor dr ward  Assessment/Plan  59 yo male with acute hypoxia respiratory failure  secondary to asthma  Principal Problem:   [name]  Will place on iv steroids, and freq albuterol and atrovent nebs.  Mucinex.  cxr is neg.  Just completed zpack, will not restart any further abx unless does not respond to steroids as he should.  Monitor closely for any respiratory deterioration.  Recheck labs in am.  Active Problems:   Hypertension- stable , clarify home meds   Tachycardia- secondary to above   COPD exacerbation/asthma- as above   High cholesterol- noted  Admit to tele bed.  Full code.  Sho,Cordarrell Sane A 01/25/2015, 5:23 AM

## 2015-01-26 MED ORDER — METHYLPREDNISOLONE SODIUM SUCC 40 MG IJ SOLR
40.0000 mg | Freq: Two times a day (BID) | INTRAMUSCULAR | Status: AC
  Administered 2015-01-26: 40 mg via INTRAVENOUS
  Filled 2015-01-26: qty 1

## 2015-01-26 MED ORDER — PREDNISONE 20 MG PO TABS
40.0000 mg | ORAL_TABLET | Freq: Every day | ORAL | Status: DC
  Administered 2015-01-27: 40 mg via ORAL
  Filled 2015-01-26: qty 2

## 2015-01-26 MED ORDER — GUAIFENESIN ER 600 MG PO TB12
1200.0000 mg | ORAL_TABLET | Freq: Two times a day (BID) | ORAL | Status: DC
  Administered 2015-01-26 – 2015-01-27 (×3): 1200 mg via ORAL
  Filled 2015-01-26 (×3): qty 2

## 2015-01-26 NOTE — Progress Notes (Signed)
PROGRESS NOTE  Antonio Baker BJY:[id number] DOB: [birth year] DOA: 01/25/2015 PCP: Elyn Aquas    Summary: 5yom PMH COPD, asthma, former smoker, presented with 3 week h/o SOB, wheezing, treated with azithromycin as an outpatient, no steroids. Using nebs and albuterol inhaler. In ED hypoxic on RA, admitted for acute hypoxic respiratory failure and COPD exacerbation.   Assessment/Plan: 1. Acute hypoxic/hypoxemic respiratory failure secondary to COPD exacerbation. Improving. 2. COPD exacerbation.much improved, no wheezing. Just finished azithromycin, no further abx.  3. Tobacco dependence in remission, quit 2001 4. Chronic leukocytosis, f/u as an outpatient.   Over much improved, wheezing appears to have resolved.  Start steroid taper, continue nebs, wean off oxygen  Anticipiate discharge home tomorrow.  Code Status: full code DVT prophylaxis: SCDs Family Communication: Discussed with patient who understands and has no concerns at this time. Disposition Plan: Anticipate home tomorrow   Murray Hodgkins, MD  [facility]  Pager [phone] If 7PM-7AM, please contact night-coverage at [email or web address], password [facility] 01/26/2015, 6:35 AM  LOS: 1 day   Consultants:    Procedures:    Antibiotics:    HPI/Subjective: Feeling better, still coughing. Breathing better.  Objective: Filed Vitals:   01/25/15 2005 01/25/15 2250 01/26/15 0300 01/26/15 0550  BP:  168/102  139/83  Pulse:  112  98  Temp:  98.9 F (37.2 C)  97.8 F (36.6 C)  TempSrc:  Oral  Oral  Resp:  17  20  Height:      Weight:      SpO2: 94% 96% 95% 96%    Intake/Output Summary (Last 24 hours) at 01/26/15 0635 Last data filed at 01/25/15 1645  Gross per 24 hour  Intake    960 ml  Output      0 ml  Net    960 ml     Filed Weights   01/25/15 0235 01/25/15 0453  Weight: 100.699 kg (222 lb) 104.509 kg (230 lb 6.4 oz)    Exam:  Afebrile, VSS General:  Appears comfortable, calm. Sitting in  chair Cardiovascular: Regular rate and rhythm, no murmur, rub or gallop. No lower extremity edema. Respiratory: Improved air movement, no w/r/r, no pursed lips, decreased respiratory effort, able to speak in full sentences.   Psychiatric: grossly normal mood and affect, speech fluent and appropriate  New data reviewed:  No new data  Pertinent data since admission:  CXR IMPRESSION: No acute cardiopulmonary findings.  ABG 7.422/43/75  Troponin, BNP, BMP unremarkable.   EKG ST  Pending data:    Scheduled Meds: . allopurinol  300 mg Oral q morning - 10a  . antiseptic oral rinse  7 mL Mouth Rinse BID  . gabapentin  300 mg Oral BID  . ipratropium-albuterol  3 mL Nebulization Q6H  . irbesartan  150 mg Oral Daily  . methylPREDNISolone (SOLU-MEDROL) injection  80 mg Intravenous Q12H  . simvastatin  40 mg Oral QPM  . sodium chloride  3 mL Intravenous Q12H  . sodium chloride  3 mL Intravenous Q12H   Continuous Infusions:   Principal Problem:   Acute respiratory failure with hypoxia Active Problems:   Hypertension   Tachycardia   COPD exacerbation   High cholesterol   Time spent  20 minutes  I, Jessica D. Leonie Green, acting as scribe, recorded this note contemporaneously in the presence of Dr. Melene Plan. Sarajane Jews, M.D. on 01/26/2015 .  I have reviewed the above documentation for accuracy and completeness, and I agree with the above. Murray Hodgkins, MD

## 2015-01-26 NOTE — Discharge Summary (Signed)
Physician Discharge Summary  Antonio Baker:[id number] DOB: 1956/02/26 DOA: 01/25/2015  PCP: Elyn Aquas  Admit date: 01/25/2015 Discharge date: 01/26/2015  Recommendations for Outpatient Follow-up:   Follow up with PCP 9/8 as scheduled for resolution of COPD.  Continue Prednisone taper.  Consider Spiriva  Will hold Coreg until follow-up. Could consider alternative hypertensive given COPD.  Consider outpatient evaluation for chronic leukocytosis   Follow-up Information    Follow up with Antonio Baker On 01/30/2015.   Specialty:  Family Medicine   Why:  keep scheduled appointment on 9/8   Contact information:   [address] [place] [address] [phone]       Discharge Diagnoses:  1. Acute hypoxic/hypoxemic respiratory failure secondary to COPD exacerbation.  2. COPD exacerbation  3. Tobacco dependence 4. Chronic leukocytosis  Discharge Condition: Improved  Disposition: Home   Diet recommendation: Heart healthy   Filed Weights   01/25/15 0235 01/25/15 0453  Weight: 100.699 kg (222 lb) 104.509 kg (230 lb 6.4 oz)    History of present illness:  56yom PMH COPD, asthma, former smoker, presented with 3 week h/o SOB, wheezing, treated with azithromycin as an outpatient, no steroids. Using nebs and albuterol inhaler. In ED hypoxic on RA, admitted for acute hypoxic respiratory failure and COPD exacerbation.  Hospital Course:  Treated COPD exacerbation with steroids, nebulizers, supplemental oxygen, and cough medications. Gradual improvement, resolution of hypoxia noted. Since he was treated with azithromycin as an outpatient, no antibiotics were provided during this hospitalization. Coreg held due to COPD exacerbation.   Individual issues as below:   1. Acute hypoxic/hypoxemic respiratory failure secondary to COPD exacerbation. Hypoxia improved. 2. COPD exacerbation. No wheezing today. Appears resolved.  3. Tobacco dependence in remission, quit  2001 4. Chronic leukocytosis, f/u as an outpatient.  Consultants:  None  Procedures:  None  Antibiotics:  None   Discharge Instructions Discharge Instructions    Diet general    Complete by:  As directed      Discharge instructions    Complete by:  As directed   Call your physician or seek immediate medical attention for increased shortness of breath, pain, fever or worsening of condition. Hold Coreg until follow-up with your medical doctor.     Increase activity slowly    Complete by:  As directed             Discharge Medication List as of 01/27/2015 11:55 AM    START taking these medications   Details  guaiFENesin (MUCINEX) 600 MG 12 hr tablet Take 1 tablet (600 mg total) by mouth 2 (two) times daily., Starting 01/27/2015, Until Discontinued, Normal    predniSONE (DELTASONE) 10 MG tablet Take 40 mg by mouth daily for 3 days, then take 20 mg by mouth daily for 3 days, then take 10 mg by mouth daily for 3 days, then stop., Normal      CONTINUE these medications which have NOT CHANGED   Details  acetaminophen (TYLENOL) 500 MG tablet Take 500 mg by mouth every 6 (six) hours as needed., Until Discontinued, Historical Med    albuterol (PROVENTIL HFA;VENTOLIN HFA) 108 (90 BASE) MCG/ACT inhaler Inhale 2 puffs into the lungs 3 (three) times daily. And every 4 hours as needed for wheezing, chest congestion, or shortness of breath., Starting 05/17/2012, Until Discontinued, Print    albuterol (PROVENTIL) (5 MG/ML) 0.5% nebulizer solution Take 1 mL (5 mg total) by nebulization every 4 (four) hours as needed. For shortness of breath/wheezing, Starting  06/14/2012, Until Discontinued, Print    allopurinol (ZYLOPRIM) 300 MG tablet Take 300 mg by mouth every morning. , Until Discontinued, Historical Med    cyclobenzaprine (FLEXERIL) 10 MG tablet Take 10 mg by mouth 3 (three) times daily as needed. For spasms, Until Discontinued, Historical Med    gabapentin (NEURONTIN) 300 MG capsule  Take 300 mg by mouth 2 (two) times daily. , Starting 12/26/2014, Until Discontinued, Historical Med    HYDROcodone-acetaminophen (NORCO) 10-325 MG per tablet Take 1 tablet by mouth every 6 (six) hours as needed., Until Discontinued, Historical Med    ipratropium (ATROVENT) 0.02 % nebulizer solution Take 2.5 mLs (500 mcg total) by nebulization 4 (four) times daily., Starting 07/04/2012, Until Discontinued, Print    simvastatin (ZOCOR) 40 MG tablet Take 40 mg by mouth every evening., Until Discontinued, Historical Med    Umeclidinium-Vilanterol (ANORO ELLIPTA) 62.5-25 MCG/INH AEPB Inhale 1 puff into the lungs daily., Until Discontinued, Historical Med    valsartan (DIOVAN) 160 MG tablet Take 160 mg by mouth daily., Until Discontinued, Historical Med      STOP taking these medications     azithromycin (ZITHROMAX) 250 MG tablet      carvedilol (COREG) 25 MG tablet        No Known Allergies  The results of significant diagnostics from this hospitalization (including imaging, microbiology, ancillary and laboratory) are listed below for reference.    Significant Diagnostic Studies: Dg Chest Port 1 View  01/25/2015   CLINICAL DATA:  Dyspnea for approximately 2 weeks, worse tonight.  EXAM: PORTABLE CHEST - 1 VIEW  COMPARISON:  07/04/2012  FINDINGS: Mild unchanged hyperinflation with mild chronic appearing interstitial coarsening. No superimposed acute findings are evident. No large effusions. No pneumothorax.  IMPRESSION: No acute cardiopulmonary findings.   Electronically Signed   By: Andreas Newport M.D.   On: 01/25/2015 03:12   Labs: Basic Metabolic Panel:  Recent Labs Lab 01/25/15 0247  NA 137  K 3.9  CL 101  CO2 29  GLUCOSE 114*  BUN 14  CREATININE 0.77  CALCIUM 10.1   CBC:  Recent Labs Lab 01/25/15 0247  WBC 14.6*  NEUTROABS 9.1*  HGB 13.4  HCT 40.5  MCV 96.2  PLT 191   Cardiac Enzymes:  Recent Labs Lab 01/25/15 0247  TROPONINI <0.03      Recent Labs   01/25/15 0247  BNP 23.0    Principal Problem:   Acute respiratory failure with hypoxia Active Problems:   Hypertension   Tachycardia   COPD exacerbation   High cholesterol   Time coordinating discharge: 30 minutes   Signed:  Murray Hodgkins, MD [facility] 01/26/2015, 7:08 AM   I, Antonio Baker. Antonio Baker, acting as scribe, recorded this note contemporaneously in the presence of Dr. Melene Plan. Sarajane Jews, M.D. on 01/26/2015 .  I have reviewed the above documentation for accuracy and completeness, and I agree with the above. Murray Hodgkins, MD

## 2015-01-27 DIAGNOSIS — F17200 Nicotine dependence, unspecified, uncomplicated: Secondary | ICD-10-CM

## 2015-01-27 MED ORDER — PREDNISONE 10 MG PO TABS: ORAL_TABLET | ORAL | Status: DC

## 2015-01-27 MED ORDER — GUAIFENESIN ER 600 MG PO TB12: 600.0000 mg | ORAL_TABLET | Freq: Two times a day (BID) | ORAL | Status: DC

## 2015-01-27 NOTE — Care Management Note (Signed)
Case Management Note  Patient Details  Name: CHRISTINA WALDROP MRN: [id number] Date of Birth: 09-07-1955  Expected Discharge Date:                  Expected Discharge Plan:  Home/Self Care  In-House Referral:  NA  Discharge planning Services  CM Consult  Post Acute Care Choice:  NA Choice offered to:  NA  DME Arranged:    DME Agency:     HH Arranged:    HH Agency:     Status of Service:  Completed, signed off  Medicare Important Message Given:    Date Medicare IM Given:    Medicare IM give by:    Date Additional Medicare IM Given:    Additional Medicare Important Message give by:     If discussed at [place] of Stay Meetings, dates discussed:    Additional Comments: Pt from home, independent at baseline. Admitted with resp distress/asthma exacerbation. Pt discharged today, home with self care. Home O2 assessment performed and pt does not meet requirements for home O2. No CM needs noted.  Sherald Barge, RN 01/27/2015, 1:31 PM

## 2015-01-27 NOTE — Progress Notes (Signed)
SATURATION QUALIFICATIONS: (This note is used to comply with regulatory documentation for home oxygen)  Patient Saturations on Room Air at Rest = 91%  Patient Saturations on Room Air while Ambulating = 91-92%  Patient Saturations on n/a Liters of oxygen while Ambulating = 91-92%  The patient does not meet qualification for home O2.

## 2015-01-27 NOTE — Progress Notes (Signed)
Patient ambulated approx 0830 and patients O2 saturations prior to ambulating was 95% room air the patient was ambulated and his sats remained around 91% per Concha Norway, NT.  During progression meeting the documented O2 sats of 86% on room air with minimal ambulation was discussed with Jamal Maes, RT she stated that she was concerned so I voiced to her that I would recheck the sat assessment for home O2.  The patient continued to be on room air at 1030 and he was checked on room air prior and it was 92.  He was then ambulated greater than 250 feet.  He was SOB but he ambulated well and his sats ever dropped lower than 91%.  Dr. Sarajane Jews was notified.

## 2015-01-27 NOTE — Progress Notes (Signed)
  PROGRESS NOTE  Antonio Baker GOT:[id number] DOB: 1956/01/01 DOA: 01/25/2015 PCP: Elyn Aquas    Summary: 69yom PMH COPD, asthma, former smoker, presented with 3 week h/o SOB, wheezing, treated with azithromycin as an outpatient, no steroids. Using nebs and albuterol inhaler. In ED hypoxic on RA, admitted for acute hypoxic respiratory failure and COPD exacerbation.   Assessment/Plan: 1. Acute hypoxic/hypoxemic respiratory failure secondary to COPD exacerbation. Hypoxia resolved. 2. COPD exacerbation. No wheezing today. Appears resolved.  3. Tobacco dependence in remission, quit 2001 4. Chronic leukocytosis, f/u as an outpatient.   Overall much improved.  Plan discharge on steroid taper.  Code Status: full code DVT prophylaxis: SCDs Family Communication: Discussed with patient who understands and has no concerns at this time. Disposition Plan: Discharge home today.  Murray Hodgkins, MD  [facility]  Pager [phone] If 7PM-7AM, please contact night-coverage at [email or web address], password [email or web address] 01/27/2015, 8:00 AM  LOS: 2 days   Consultants:    Procedures:    Antibiotics:    HPI/Subjective: Relief with mucinex. Breathing improved. Denies nausea, vomiting. Eating without difficulty and DOE.  Objective: Filed Vitals:   01/26/15 2140 01/27/15 0342 01/27/15 0615 01/27/15 0752  BP: 162/109  140/84   Pulse: 122  83   Temp: 98.1 F (36.7 C)  97.7 F (36.5 C)   TempSrc: Oral  Oral   Resp: 20  20   Height:      Weight:      SpO2: 89% 90% 86% 87%    Intake/Output Summary (Last 24 hours) at 01/27/15 0800 Last data filed at 01/26/15 0900  Gross per 24 hour  Intake    300 ml  Output      0 ml  Net    300 ml     Filed Weights   01/25/15 0235 01/25/15 0453  Weight: 100.699 kg (222 lb) 104.509 kg (230 lb 6.4 oz)    Exam:  Afebrile, VSS General:  Appears calm and comfortable Cardiovascular: RRR, no m/r/g. No LE edema. Respiratory: CTA bilaterally, no w/r/r.  Normal respiratory effort. Speaks in full sentences. Psychiatric: grossly normal mood and affect, speech fluent and appropriate Ambulated to nursing station and back with mild SOB  New data reviewed:  No new data  Pertinent data since admission:  CXR IMPRESSION: No acute cardiopulmonary findings.  ABG 7.422/43/75  Troponin, BNP, BMP unremarkable.   EKG ST  Pending data:    Scheduled Meds: . allopurinol  300 mg Oral q morning - 10a  . antiseptic oral rinse  7 mL Mouth Rinse BID  . gabapentin  300 mg Oral BID  . guaiFENesin  1,200 mg Oral BID  . ipratropium-albuterol  3 mL Nebulization Q6H  . irbesartan  150 mg Oral Daily  . predniSONE  40 mg Oral Q breakfast  . simvastatin  40 mg Oral QPM  . sodium chloride  3 mL Intravenous Q12H  . sodium chloride  3 mL Intravenous Q12H   Continuous Infusions:   Principal Problem:   Acute respiratory failure with hypoxia Active Problems:   Hypertension   Tachycardia   COPD exacerbation   High cholesterol  I, Jessica D. Leonie Green, acting as scribe, recorded this note contemporaneously in the presence of Dr. Melene Plan. Sarajane Jews, M.D. on 01/27/2015 .  I have reviewed the above documentation for accuracy and completeness, and I agree with the above. Murray Hodgkins, MD

## 2015-01-27 NOTE — Progress Notes (Signed)
Patient discharged with instructions, prescription, and care notes.  Verbalized understanding via teach back.  IV was removed and the site was WNL. Patient voiced no further complaints or concerns at the time of discharge.  Appointments scheduled per instructions.  Patient left the floor via ambulating with staff  in stable condition.

## 2015-02-07 ENCOUNTER — Inpatient Hospital Stay (HOSPITAL_COMMUNITY)
Admission: EM | Admit: 2015-02-07 | Discharge: 2015-02-10 | DRG: 176 | Disposition: A | Discharge status: Home / Self Care | Payer: Medicare (Managed Care) | Attending: Family Medicine | Admitting: Family Medicine

## 2015-02-07 ENCOUNTER — Encounter (HOSPITAL_COMMUNITY): Payer: Self-pay | Admitting: Emergency Medicine

## 2015-02-07 ENCOUNTER — Emergency Department (HOSPITAL_COMMUNITY): Payer: Medicare (Managed Care)

## 2015-02-07 DIAGNOSIS — K567 Ileus, unspecified: Secondary | ICD-10-CM | POA: Diagnosis present

## 2015-02-07 DIAGNOSIS — E875 Hyperkalemia: Secondary | ICD-10-CM | POA: Diagnosis not present

## 2015-02-07 DIAGNOSIS — Z809 Family history of malignant neoplasm, unspecified: Secondary | ICD-10-CM

## 2015-02-07 DIAGNOSIS — N179 Acute kidney failure, unspecified: Secondary | ICD-10-CM | POA: Diagnosis present

## 2015-02-07 DIAGNOSIS — R1011 Right upper quadrant pain: Secondary | ICD-10-CM | POA: Diagnosis present

## 2015-02-07 DIAGNOSIS — I82402 Acute embolism and thrombosis of unspecified deep veins of left lower extremity: Secondary | ICD-10-CM

## 2015-02-07 DIAGNOSIS — G8929 Other chronic pain: Secondary | ICD-10-CM | POA: Diagnosis present

## 2015-02-07 DIAGNOSIS — E860 Dehydration: Secondary | ICD-10-CM | POA: Diagnosis present

## 2015-02-07 DIAGNOSIS — Z87891 Personal history of nicotine dependence: Secondary | ICD-10-CM

## 2015-02-07 DIAGNOSIS — I82409 Acute embolism and thrombosis of unspecified deep veins of unspecified lower extremity: Secondary | ICD-10-CM | POA: Diagnosis present

## 2015-02-07 DIAGNOSIS — I2699 Other pulmonary embolism without acute cor pulmonale: Principal | ICD-10-CM

## 2015-02-07 DIAGNOSIS — Z8249 Family history of ischemic heart disease and other diseases of the circulatory system: Secondary | ICD-10-CM

## 2015-02-07 DIAGNOSIS — Z823 Family history of stroke: Secondary | ICD-10-CM

## 2015-02-07 DIAGNOSIS — E780 Pure hypercholesterolemia: Secondary | ICD-10-CM | POA: Diagnosis not present

## 2015-02-07 DIAGNOSIS — I10 Essential (primary) hypertension: Secondary | ICD-10-CM | POA: Diagnosis not present

## 2015-02-07 DIAGNOSIS — J449 Chronic obstructive pulmonary disease, unspecified: Secondary | ICD-10-CM | POA: Diagnosis not present

## 2015-02-07 DIAGNOSIS — R109 Unspecified abdominal pain: Secondary | ICD-10-CM

## 2015-02-07 DIAGNOSIS — R000 Tachycardia, unspecified: Secondary | ICD-10-CM

## 2015-02-07 DIAGNOSIS — D72829 Elevated white blood cell count, unspecified: Secondary | ICD-10-CM

## 2015-02-07 DIAGNOSIS — J441 Chronic obstructive pulmonary disease with (acute) exacerbation: Secondary | ICD-10-CM | POA: Diagnosis present

## 2015-02-07 HISTORY — DX: Elevated white blood cell count, unspecified: D72.829

## 2015-02-07 LAB — BASIC METABOLIC PANEL
ANION GAP: 10 (ref 5–15)
BUN: 32 mg/dL — ABNORMAL HIGH (ref 6–20)
CHLORIDE: 100 mmol/L — AB (ref 101–111)
CO2: 24 mmol/L (ref 22–32)
CREATININE: 1.57 mg/dL — AB (ref 0.61–1.24)
Calcium: 10.9 mg/dL — ABNORMAL HIGH (ref 8.9–10.3)
GFR calc non Af Amer: 46 mL/min — ABNORMAL LOW (ref >60)
GFR, EST AFRICAN AMERICAN: 53 mL/min — AB (ref >60)
Glucose, Bld: 114 mg/dL — ABNORMAL HIGH (ref 65–99)
POTASSIUM: 4.7 mmol/L (ref 3.5–5.1)
SODIUM: 134 mmol/L — AB (ref 135–145)

## 2015-02-07 LAB — CBC
HCT: 45.9 % (ref 39.0–52.0)
Hemoglobin: 15.3 g/dL (ref 13.0–17.0)
MCH: 32 pg (ref 26.0–34.0)
MCHC: 33.3 g/dL (ref 30.0–36.0)
MCV: 96 fL (ref 78.0–100.0)
PLATELETS: 211 10*3/uL (ref 150–400)
RBC: 4.78 MIL/uL (ref 4.22–5.81)
RDW: 12.8 % (ref 11.5–15.5)
WBC: 20.4 10*3/uL — AB (ref 4.0–10.5)

## 2015-02-07 LAB — URINALYSIS, ROUTINE W REFLEX MICROSCOPIC
Glucose, UA: NEGATIVE mg/dL
HGB URINE DIPSTICK: NEGATIVE
LEUKOCYTES UA: NEGATIVE
NITRITE: NEGATIVE
PROTEIN: NEGATIVE mg/dL
SPECIFIC GRAVITY, URINE: 1.02 (ref 1.005–1.030)
UROBILINOGEN UA: 0.2 mg/dL (ref 0.0–1.0)
pH: 6 (ref 5.0–8.0)

## 2015-02-07 MED ORDER — ENOXAPARIN SODIUM 100 MG/ML ~~LOC~~ SOLN
100.0000 mg | Freq: Two times a day (BID) | SUBCUTANEOUS | Status: DC
  Administered 2015-02-07 – 2015-02-09 (×4): 100 mg via SUBCUTANEOUS
  Filled 2015-02-07 (×4): qty 1

## 2015-02-07 MED ORDER — SODIUM CHLORIDE 0.9 % IJ SOLN
INTRAMUSCULAR | Status: AC
  Filled 2015-02-07: qty 500

## 2015-02-07 MED ORDER — IPRATROPIUM BROMIDE 0.02 % IN SOLN
500.0000 ug | Freq: Four times a day (QID) | RESPIRATORY_TRACT | Status: DC
  Filled 2015-02-07: qty 2.5

## 2015-02-07 MED ORDER — HYDROCODONE-ACETAMINOPHEN 10-325 MG PO TABS
1.0000 | ORAL_TABLET | Freq: Once | ORAL | Status: AC
  Administered 2015-02-07: 1 via ORAL
  Filled 2015-02-07: qty 1

## 2015-02-07 MED ORDER — IOHEXOL 350 MG/ML SOLN
80.0000 mL | Freq: Once | INTRAVENOUS | Status: AC | PRN
  Administered 2015-02-07: 80 mL via INTRAVENOUS

## 2015-02-07 MED ORDER — IPRATROPIUM BROMIDE 0.02 % IN SOLN: 500.0000 ug | Freq: Four times a day (QID) | RESPIRATORY_TRACT | Status: DC | PRN

## 2015-02-07 MED ORDER — SODIUM CHLORIDE 0.9 % IV BOLUS (SEPSIS)
1000.0000 mL | Freq: Once | INTRAVENOUS | Status: AC
  Administered 2015-02-07: 1000 mL via INTRAVENOUS

## 2015-02-07 MED ORDER — ALLOPURINOL 300 MG PO TABS
300.0000 mg | ORAL_TABLET | Freq: Every morning | ORAL | Status: DC
  Administered 2015-02-08 – 2015-02-10 (×3): 300 mg via ORAL
  Filled 2015-02-07 (×3): qty 1

## 2015-02-07 MED ORDER — INFLUENZA VAC SPLIT QUAD 0.5 ML IM SUSY
0.5000 mL | PREFILLED_SYRINGE | INTRAMUSCULAR | Status: AC
  Administered 2015-02-08: 0.5 mL via INTRAMUSCULAR
  Filled 2015-02-07: qty 0.5

## 2015-02-07 MED ORDER — ONDANSETRON HCL 4 MG/2ML IJ SOLN: 4.0000 mg | Freq: Four times a day (QID) | INTRAMUSCULAR | Status: DC | PRN

## 2015-02-07 MED ORDER — SODIUM CHLORIDE 0.9 % IV SOLN: INTRAVENOUS | Status: AC

## 2015-02-07 MED ORDER — UMECLIDINIUM BROMIDE 62.5 MCG/INH IN AEPB
1.0000 | INHALATION_SPRAY | Freq: Every day | RESPIRATORY_TRACT | Status: DC
  Filled 2015-02-07 (×3): qty 30

## 2015-02-07 MED ORDER — VALSARTAN-HYDROCHLOROTHIAZIDE 320-25 MG PO TABS: 1.0000 | ORAL_TABLET | Freq: Every day | ORAL | Status: DC

## 2015-02-07 MED ORDER — ROFLUMILAST 500 MCG PO TABS
500.0000 ug | ORAL_TABLET | Freq: Every day | ORAL | Status: DC
  Administered 2015-02-07 – 2015-02-10 (×4): 500 ug via ORAL
  Filled 2015-02-07 (×4): qty 1

## 2015-02-07 MED ORDER — IRBESARTAN 300 MG PO TABS
300.0000 mg | ORAL_TABLET | Freq: Every day | ORAL | Status: DC
  Administered 2015-02-08 – 2015-02-10 (×3): 300 mg via ORAL
  Filled 2015-02-07 (×3): qty 1

## 2015-02-07 MED ORDER — GABAPENTIN 300 MG PO CAPS
300.0000 mg | ORAL_CAPSULE | Freq: Three times a day (TID) | ORAL | Status: DC
  Administered 2015-02-07 – 2015-02-10 (×8): 300 mg via ORAL
  Filled 2015-02-07: qty 3
  Filled 2015-02-07 (×7): qty 1

## 2015-02-07 MED ORDER — ALBUTEROL SULFATE (2.5 MG/3ML) 0.083% IN NEBU: 3.0000 mL | INHALATION_SOLUTION | Freq: Three times a day (TID) | RESPIRATORY_TRACT | Status: DC | PRN

## 2015-02-07 MED ORDER — PNEUMOCOCCAL VAC POLYVALENT 25 MCG/0.5ML IJ INJ
0.5000 mL | INJECTION | INTRAMUSCULAR | Status: AC
  Administered 2015-02-08: 0.5 mL via INTRAMUSCULAR
  Filled 2015-02-07: qty 0.5

## 2015-02-07 MED ORDER — ONDANSETRON HCL 4 MG PO TABS: 4.0000 mg | ORAL_TABLET | Freq: Four times a day (QID) | ORAL | Status: DC | PRN

## 2015-02-07 MED ORDER — FLUTICASONE FUROATE-VILANTEROL 100-25 MCG/INH IN AEPB
1.0000 | INHALATION_SPRAY | Freq: Every day | RESPIRATORY_TRACT | Status: DC
  Filled 2015-02-07 (×3): qty 60

## 2015-02-07 MED ORDER — ALBUTEROL SULFATE (2.5 MG/3ML) 0.083% IN NEBU
3.0000 mL | INHALATION_SOLUTION | Freq: Three times a day (TID) | RESPIRATORY_TRACT | Status: DC
  Filled 2015-02-07: qty 3

## 2015-02-07 MED ORDER — ACETAMINOPHEN 325 MG PO TABS: 650.0000 mg | ORAL_TABLET | Freq: Four times a day (QID) | ORAL | Status: DC | PRN

## 2015-02-07 MED ORDER — CYCLOBENZAPRINE HCL 10 MG PO TABS
10.0000 mg | ORAL_TABLET | Freq: Three times a day (TID) | ORAL | Status: DC | PRN
  Administered 2015-02-07 – 2015-02-10 (×4): 10 mg via ORAL
  Filled 2015-02-07 (×4): qty 1

## 2015-02-07 MED ORDER — ACETAMINOPHEN 650 MG RE SUPP: 650.0000 mg | Freq: Four times a day (QID) | RECTAL | Status: DC | PRN

## 2015-02-07 MED ORDER — HYDROCODONE-ACETAMINOPHEN 10-325 MG PO TABS
1.0000 | ORAL_TABLET | Freq: Four times a day (QID) | ORAL | Status: DC | PRN
Start: 2015-02-07 — End: 2015-02-10
  Administered 2015-02-08 – 2015-02-10 (×7): 1 via ORAL
  Filled 2015-02-07 (×7): qty 1

## 2015-02-07 MED ORDER — ALUM & MAG HYDROXIDE-SIMETH 200-200-20 MG/5ML PO SUSP
30.0000 mL | Freq: Four times a day (QID) | ORAL | Status: DC | PRN
  Administered 2015-02-08 – 2015-02-09 (×2): 30 mL via ORAL
  Filled 2015-02-07 (×2): qty 30

## 2015-02-07 MED ORDER — HYDROCHLOROTHIAZIDE 25 MG PO TABS
25.0000 mg | ORAL_TABLET | Freq: Every day | ORAL | Status: DC
  Administered 2015-02-08: 25 mg via ORAL
  Filled 2015-02-07 (×2): qty 1

## 2015-02-07 MED ORDER — ZOLPIDEM TARTRATE 5 MG PO TABS
5.0000 mg | ORAL_TABLET | Freq: Every evening | ORAL | Status: DC | PRN
  Administered 2015-02-07 – 2015-02-09 (×3): 5 mg via ORAL
  Filled 2015-02-07 (×3): qty 1

## 2015-02-07 MED ORDER — SIMVASTATIN 20 MG PO TABS
40.0000 mg | ORAL_TABLET | Freq: Every evening | ORAL | Status: DC
  Administered 2015-02-07 – 2015-02-09 (×3): 40 mg via ORAL
  Filled 2015-02-07 (×3): qty 2

## 2015-02-07 MED ORDER — SODIUM CHLORIDE 0.9 % IJ SOLN
INTRAMUSCULAR | Status: AC
  Filled 2015-02-07: qty 30

## 2015-02-07 MED ORDER — SODIUM CHLORIDE 0.9 % IV BOLUS (SEPSIS)
1000.0000 mL | Freq: Once | INTRAVENOUS | Status: AC
Start: 2015-02-07 — End: 2015-02-07
  Administered 2015-02-07: 1000 mL via INTRAVENOUS

## 2015-02-07 MED ORDER — SENNOSIDES-DOCUSATE SODIUM 8.6-50 MG PO TABS: 1.0000 | ORAL_TABLET | Freq: Every evening | ORAL | Status: DC | PRN

## 2015-02-07 NOTE — ED Notes (Signed)
Patient positive for DVT in left leg per ultrasound tech. Dr Carmelia Roller notified

## 2015-02-07 NOTE — Progress Notes (Signed)
ANTICOAGULATION CONSULT NOTE - Initial Consult  Pharmacy Consult for lovenox Indication: DVT  No Known Allergies  Patient Measurements: Height: 6\' 2"  (188 cm) Weight: 221 lb (100.245 kg) IBW/kg (Calculated) : 82.2   Vital Signs: Temp: 97.5 F (36.4 C) (09/16 1303) Temp Source: Oral (09/16 1303) BP: 111/70 mmHg (09/16 1303) Pulse Rate: 107 (09/16 1303)  Labs:  Recent Labs  02/07/15 1320  HGB 15.3  HCT 45.9  PLT 211  CREATININE 1.57*    Estimated Creatinine Clearance: 62.5 mL/min (by C-G formula based on Cr of 1.57).   Medical History: Past Medical History  Diagnosis Date  . Hypertension   . Gout   . High cholesterol   . Asthma     Medications:  See medication history  Assessment: 59 yo man to start lovenox for LLE DVT.  Baseline labs wnl. Goal of Therapy:  Anti-Xa level 0.6-1 units/ml 4hrs after LMWH dose given Monitor platelets by anticoagulation protocol: Yes   Plan:  Lovenox 100 mg sq q12 hours Check CBC q 3 days while on lovenox  Seay, Lora Poteet 02/07/2015,3:10 PM

## 2015-02-07 NOTE — Progress Notes (Signed)
Notified Dr. Nehemiah Settle that Antonio patients CT angio result was positive.  Antonio patient is currently in his room.  He voices no complaints or concerns at this time.  Will continue to monitor him.

## 2015-02-07 NOTE — ED Notes (Signed)
Report given to floor. Awaiting pt's return from CT for transfer.

## 2015-02-07 NOTE — ED Notes (Addendum)
Pt reports frequent falls for last five months. Pt reports intermittent dizziness for last several days when standing up. Pt reports last fall was last night. Pt reports left knee pain ever since. No deformity noted. Pt denies loc or hitting head.

## 2015-02-07 NOTE — Progress Notes (Addendum)
Patient is taken Breo inhaler from home and only takes albuterol and atrovent when needed by nebulizer. Only for asthma flare.  The albuterol 3 times a day and atrovent 4 times a day has been changed since his last admission 3 weeks a go.

## 2015-02-07 NOTE — ED Provider Notes (Addendum)
CSN: [id number]     Arrival date & time 02/07/15  1058 History  This chart was scribed for Lajean Saver, MD by Starleen Arms, ED Scribe. This patient was seen in room APA06/APA06 and the patient's care was started at 12:52 PM.   Chief Complaint  Patient presents with  . Fall   The history is provided by the patient. No language interpreter was used.   HPI Comments: Antonio Baker is a 59 y.o. male who indicates he fell 2 days ago, stepping wrong when stepping off stair - with fall, no loc, no head, neck or back injury.   States most recent fall prior to that was 3-4 months ago, which he also describes as a trip and fall.   Otherwise, denies recent balance or gait issues. No faintness, lightheadedness or dizziness.  Pt notes hxDDD , no prior back surgery.  Pt is complaining of mechanical fall from standing after missing a step two nights ago.  Preceding the fall, the patient reports pain in the back of the left knee (worse with walking) that worsened the day after the fall.  He also reports some tingling in the feet normal to baseline and leg swelling below the knees that occurs only with standing.  He denies head trauma, LOC.  He denies hx of DM, Baker's cyst, spinal stenosis.  Denies radicular pain. No perineal or saddle area numbness. No problems w urine retention, or incontinence.  He denies hx of back surgery.    PCP: Salli Real   Past Medical History  Diagnosis Date  . Hypertension   . Gout   . High cholesterol   . Asthma    Past Surgical History  Procedure Laterality Date  . Right knee arthroscopy    . Hernia repair    . Esophagogastroduodenoscopy  06/12/2012    WEX:HBZJI hiatal hernia/WHITE PLAQUES MOST LIKELY DUE TO CANDIDA ESOPHAGITIS/Single ulcer at the gastroesophageal junction-MOST LIKELY CAUSE FOR ODYNOPHAGIA/Stricture  at the gastroesophageal junction   History reviewed. No pertinent family history. Social History  Substance Use Topics  . Smoking status: Former Smoker -- 2.00  packs/day for 20 years    Types: Cigarettes    Quit date: 05/16/1999  . Smokeless tobacco: None  . Alcohol Use: Yes     Comment: occasional    Review of Systems  Constitutional: Negative for fever and chills.  HENT: Negative for sore throat.   Eyes: Negative for visual disturbance.  Respiratory: Negative for cough and shortness of breath.   Cardiovascular: Negative for chest pain and palpitations.  Gastrointestinal: Negative for vomiting, abdominal pain and diarrhea.  Genitourinary: Negative for dysuria and flank pain.  Musculoskeletal: Negative for back pain and neck pain.  Skin: Negative for rash.  Neurological: Negative for speech difficulty, weakness, light-headedness and headaches.  Hematological: Does not bruise/bleed easily.  Psychiatric/Behavioral: Negative for confusion.   A complete 10 system review of systems was obtained and all systems are negative except as noted in the HPI and PMH.   Allergies  Review of patient's allergies indicates no known allergies.  Home Medications   Prior to Admission medications   Medication Sig Start Date End Date Taking? Authorizing Provider  acetaminophen (TYLENOL) 500 MG tablet Take 500 mg by mouth every 6 (six) hours as needed.    Historical Provider, MD  albuterol (PROVENTIL HFA;VENTOLIN HFA) 108 (90 BASE) MCG/ACT inhaler Inhale 2 puffs into the lungs 3 (three) times daily. And every 4 hours as needed for wheezing, chest congestion, or shortness of  breath. 05/17/12   [name], MD  albuterol (PROVENTIL) (5 MG/ML) 0.5% nebulizer solution Take 1 mL (5 mg total) by nebulization every 4 (four) hours as needed. For shortness of breath/wheezing 06/14/12   [name], MD  allopurinol (ZYLOPRIM) 300 MG tablet Take 300 mg by mouth every morning.     Historical Provider, MD  cyclobenzaprine (FLEXERIL) 10 MG tablet Take 10 mg by mouth 3 (three) times daily as needed. For spasms    Historical Provider, MD  gabapentin (NEURONTIN) 300 MG  capsule Take 300 mg by mouth 2 (two) times daily.  12/26/14   Historical Provider, MD  guaiFENesin (MUCINEX) 600 MG 12 hr tablet Take 1 tablet (600 mg total) by mouth 2 (two) times daily. 01/27/15   [name], MD  HYDROcodone-acetaminophen [facility]) 10-325 MG per tablet Take 1 tablet by mouth every 6 (six) hours as needed.    Historical Provider, MD  ipratropium (ATROVENT) 0.02 % nebulizer solution Take 2.5 mLs (500 mcg total) by nebulization 4 (four) times daily. 07/04/12   [name], PA-C  predniSONE (DELTASONE) 10 MG tablet Take 40 mg by mouth daily for 3 days, then take 20 mg by mouth daily for 3 days, then take 10 mg by mouth daily for 3 days, then stop. 01/27/15   [name], MD  simvastatin (ZOCOR) 40 MG tablet Take 40 mg by mouth every evening.    Historical Provider, MD  Umeclidinium-Vilanterol (ANORO ELLIPTA) 62.5-25 MCG/INH AEPB Inhale 1 puff into the lungs daily.    Historical Provider, MD  valsartan (DIOVAN) 160 MG tablet Take 160 mg by mouth daily.    Historical Provider, MD   BP 108/62 mmHg  Pulse 126  Temp(Src) 97.6 F (36.4 C) (Oral)  Resp 18  Ht 6\' 2"  (1.88 m)  Wt 221 lb (100.245 kg)  BMI 28.36 kg/m2  SpO2 100% Physical Exam  Constitutional: He is oriented to person, place, and time. He appears well-developed and well-nourished. No distress.  HENT:  Head: Normocephalic and atraumatic.  Nose: Nose normal.  Mouth/Throat: Oropharynx is clear and moist.  Eyes: Conjunctivae and EOM are normal.  Neck: Normal range of motion. Neck supple. No tracheal deviation present. No thyromegaly present.  No bruits.   Cardiovascular: Regular rhythm, normal heart sounds and intact distal pulses.  Exam reveals no gallop and no friction rub.   No murmur heard. tachycardic  Pulmonary/Chest: Effort normal and breath sounds normal. No respiratory distress. He exhibits no tenderness.  Abdominal: Soft. Bowel sounds are normal. There is no tenderness.  Genitourinary:  No cva tenderness   Musculoskeletal: Normal range of motion. He exhibits no edema or tenderness.  CTLS spine, non tender, aligned, no step off. good rom bil ext, no deformity noted, distal pulse 2+ bilaterally incl bil dp/pt. Tenderness posterior to the left knee, w mild associated fullness.  Left knee stable. No effusion.    Neurological: He is alert and oriented to person, place, and time.  Motor intact bil, stre 5/5. sens grossly intact. Ambulates w steady gait.   Skin: Skin is warm and dry. No rash noted.  Psychiatric: He has a normal mood and affect. His behavior is normal.  Nursing note and vitals reviewed.   ED Course  Procedures (including critical care time)  DIAGNOSTIC STUDIES: Oxygen Saturation is 100% on RA, normal by my interpretation.    COORDINATION OF CARE:  12:58 PM Discussed plan to order labs and imaging of the left leg. Patient acknowledges and agrees with plan.  I have personally reviewed and evaluated these images and lab results as part of my medical decision-making.   EKG Interpretation   Date/Time:  Friday February 07 2015 13:35:40 EDT Ventricular Rate:  110 PR Interval:  135 QRS Duration: 83 QT Interval:  321 QTC Calculation: 434 R Axis:   50 Text Interpretation:  Sinus tachycardia No significant change since last  tracing Confirmed by Ashok Cordia  MD, Lennette Bihari ([id number]) on 02/07/2015 1:37:29 PM      MDM   I personally performed the services described in this documentation, which was scribed in my presence. The recorded information has been reviewed and considered. Lajean Saver, MD  Reviewed nursing notes and prior charts for additional history.   Imaging study and lab ordered.   Doppler u/s positive for dvt.  lovenox per pharmacy.  On review labs, new mild renal insuff/AKI.  Pt noted to be mildly tachycardic in ED today, and during recent admit for dyspnea.  Pt denies any cp or discomfort.  ?whether could have small PE, given recent admit for dyspnea, tachycardia -  although will be on anticoag either way, as PE dx will affect tx duration, on discussion w admitting hospitalist, will get  CT chest r/o PE in ED.  Given AKI, new dx dvt, will admit for obs.      Lajean Saver, MD 02/07/15 [place], MD 02/07/15 [phone]

## 2015-02-07 NOTE — H&P (Addendum)
History and Physical  Antonio Baker:[id number] DOB: November 17, 1955 DOA: 02/07/2015  Referring physician: Dr Ashok Cordia, ED physician PCP: Elyn Aquas   Chief Complaint: Left leg pain  HPI: Antonio Baker is a 59 y.o. male  With a history of hypertension, Gout, High Cholesterol, Asthma/COPD, chronic pain on opiates, recent hospitalization for acute respiratory failure with COPD exacerbation. The patient was discharged approximately 11 days ago. The patient has been relatively sedentary since the onset of his COPD exacerbation 3 weeks ago. 2 days ago, the patient misstepped and fell off a stair. He did hit his leg, but was without any other trauma. The left leg pain located in the popliteal fossa and in his left calf and was described as aching and worsened with ambulation. Patient also had swelling that started 2 days ago has been worsening. No palliating factors. Patient presented to the emergency department for evaluation. He denies chest pain/pressure and shortness of breath   Review of Systems:   Pt complains of numbness and tingling in his feet at baseline. The patient also has chronic back pain.  Pt denies any fevers, chills, nausea, vomiting, diarrhea, constipation, abdominal pain, shortness of breath, dyspnea on exertion, orthopnea, cough, wheezing, palpitations, headache, vision changes, lightheadedness, dizziness, diarrhea, constipation, melena, rectal bleeding.  Review of systems are otherwise negative  Past Medical History  Diagnosis Date  . Hypertension   . Gout   . High cholesterol   . Asthma    Past Surgical History  Procedure Laterality Date  . Right knee arthroscopy    . Hernia repair    . Esophagogastroduodenoscopy  06/12/2012    QZR:AQTMA hiatal hernia/WHITE PLAQUES MOST LIKELY DUE TO CANDIDA ESOPHAGITIS/Single ulcer at the gastroesophageal junction-MOST LIKELY CAUSE FOR ODYNOPHAGIA/Stricture  at the gastroesophageal junction   Social History:  reports that he quit smoking  about 15 years ago. His smoking use included Cigarettes. He has a 40 pack-year smoking history. He does not have any smokeless tobacco history on file. He reports that he drinks alcohol. He reports that he does not use illicit drugs. Patient lives at home & is able to participate in activities of daily living  No Known Allergies  Family History  Problem Relation Age of Onset  . Stroke Father   . Hypertension Father   . Cancer Mother       Prior to Admission medications   Medication Sig Start Date End Date Taking? Authorizing Provider  acetaminophen (TYLENOL) 500 MG tablet Take 500 mg by mouth every 6 (six) hours as needed.   Yes Historical Provider, MD  albuterol (PROVENTIL HFA;VENTOLIN HFA) 108 (90 BASE) MCG/ACT inhaler Inhale 2 puffs into the lungs 3 (three) times daily. And every 4 hours as needed for wheezing, chest congestion, or shortness of breath. 05/17/12  Yes [name], MD  albuterol (PROVENTIL) (5 MG/ML) 0.5% nebulizer solution Take 1 mL (5 mg total) by nebulization every 4 (four) hours as needed. For shortness of breath/wheezing 06/14/12  Yes [name], MD  allopurinol (ZYLOPRIM) 300 MG tablet Take 300 mg by mouth every morning.    Yes Historical Provider, MD  BREO ELLIPTA 100-25 MCG/INH AEPB Inhale 1 puff into the lungs daily. 01/30/15  Yes Historical Provider, MD  cyclobenzaprine (FLEXERIL) 10 MG tablet Take 10 mg by mouth 3 (three) times daily as needed. For spasms   Yes Historical Provider, MD  gabapentin (NEURONTIN) 300 MG capsule Take 300 mg by mouth 3 (three) times daily.  12/26/14  Yes Historical Provider, MD  guaiFENesin (MUCINEX) 600 MG 12 hr tablet Take 1 tablet (600 mg total) by mouth 2 (two) times daily. Patient taking differently: Take 600 mg by mouth 2 (two) times daily as needed for to loosen phlegm.  01/27/15  Yes [name], MD  HYDROcodone-acetaminophen [facility]) 10-325 MG per tablet Take 1 tablet by mouth every 6 (six) hours as needed.   Yes  Historical Provider, MD  INCRUSE ELLIPTA 62.5 MCG/INH AEPB Inhale 1 puff into the lungs daily. 02/05/15  Yes Historical Provider, MD  ipratropium (ATROVENT) 0.02 % nebulizer solution Take 2.5 mLs (500 mcg total) by nebulization 4 (four) times daily. 07/04/12  Yes [name], PA-C  roflumilast (DALIRESP) 500 MCG TABS tablet Take 500 mcg by mouth daily.   Yes Historical Provider, MD  simvastatin (ZOCOR) 40 MG tablet Take 40 mg by mouth every evening.   Yes Historical Provider, MD  valsartan-hydrochlorothiazide (DIOVAN-HCT) 320-25 MG per tablet Take 1 tablet by mouth daily. 01/30/15   Historical Provider, MD    Physical Exam: BP 111/70 mmHg  Pulse 107  Temp(Src) 97.5 F (36.4 C) (Oral)  Resp 18  Ht 6\' 2"  (1.88 m)  Wt 221 lb (100.245 kg)  BMI 28.36 kg/m2  SpO2 100%  General: Older Caucasian gentleman. Awake and alert and oriented x3. No acute cardiopulmonary distress.  Eyes: Pupils equal, round, reactive to light. Extraocular muscles are intact. Sclerae anicteric and noninjected.  ENT:  Dry mucosal membranes. No mucosal lesions. Teeth in moderate repair  Neck: Neck supple without lymphadenopathy. No carotid bruits. No masses palpated.  Cardiovascular: Regular rate with normal S1-S2 sounds. No murmurs, rubs, gallops auscultated. No JVD.  Respiratory: Good respiratory effort with no wheezes, rales, rhonchi. Lungs clear to auscultation bilaterally.  Abdomen: Soft, nontender, nondistended. Active bowel sounds. No masses or hepatosplenomegaly  Skin: Dry, warm to touch. 2+ dorsalis pedis and radial pulses. Musculoskeletal: Tenderness to left calf and popliteal fossa-. All major joints not erythematous nontender. The left leg is slightly more swollen compared to the right Psychiatric: Intact judgment and insight.  Neurologic: No focal neurological deficits. Cranial nerves II through XII are grossly intact.           Labs on Admission:  Basic Metabolic Panel:  Recent Labs Lab 02/07/15 1320    NA 134*  K 4.7  CL 100*  CO2 24  GLUCOSE 114*  BUN 32*  CREATININE 1.57*  CALCIUM 10.9*   Liver Function Tests: No results for input(s): AST, ALT, ALKPHOS, BILITOT, PROT, ALBUMIN in the last 168 hours. No results for input(s): LIPASE, AMYLASE in the last 168 hours. No results for input(s): AMMONIA in the last 168 hours. CBC:  Recent Labs Lab 02/07/15 1320  WBC 20.4*  HGB 15.3  HCT 45.9  MCV 96.0  PLT 211   Cardiac Enzymes: No results for input(s): CKTOTAL, CKMB, CKMBINDEX, TROPONINI in the last 168 hours.  BNP (last 3 results)  Recent Labs  01/25/15 0247  BNP 23.0    ProBNP (last 3 results) No results for input(s): PROBNP in the last 8760 hours.  CBG: No results for input(s): GLUCAP in the last 168 hours.  Radiological Exams on Admission: US Venous Img Lower Unilateral Left  02/07/2015   CLINICAL DATA:  Left knee calf pain x3 days, fell 2 days ago  EXAM: LEFT LOWER EXTREMITY VENOUS DOPPLER ULTRASOUND  TECHNIQUE: Gray-scale sonography with compression, as well as color and duplex ultrasound, were performed to evaluate the deep venous system from the level of the common  femoral vein through the popliteal and proximal calf veins.  COMPARISON:  None  FINDINGS: echogenic thrombus in the popliteal vein, which is noncompressible. no flow is noted on color doppler through this segment. more proximally, the common femoral, profunda femoral, and femoral veins remain patent and compressible. Visualized segments of the saphenous venous system normal in caliber and compressibility. Visualized posterior tibial and calf veins unremarkable. Survey views of the contralateral common femoral vein are unremarkable.  IMPRESSION: 1. Occlusive LEFT popliteal vein DVT.   Electronically Signed   By: Lucrezia Europe M.D.   On: 02/07/2015 14:51    EKG: Independently reviewed. Sinus tachycardia with heart rate of 110. Normal intervals. No ST changes. Negative for STEMI  Assessment/Plan Present on  Admission:  . AKI (acute kidney injury) . DVT (deep venous thrombosis)  This patient was discussed with the ED physician, including pertinent vitals, physical exam findings, labs, and imaging.  We also discussed care given by the ED provider.  #1 acute renal injury  Admits for observation  IV fluids overnight - will give an additional liter bolus over 4 hours, then 150 per hour  Recheck creatinine in the morning #2 DVT  Risk factors - increased sedentary life, recent illness  Start Lovenox  Case Management for Eliquis eligibility #3 dehydration   Rehydrate as above #4 hypertension  Continue home medications  DVT prophylaxis: On treatment dose of Lovenox  Consultants: Case management  Code Status: Full code  Family Communication: None   Disposition Plan: Observe overnight   Truett Mainland, DO [facility] Pager [phone]

## 2015-02-07 NOTE — Progress Notes (Signed)
Patient ID: Antonio Baker, male   DOB: [birth year], 59 y.o.   MRN: [id number]   CTA Chest:  IMPRESSION: 1. Positive for right lower lobe segmental pulmonary emboli, nonocclusive. No pulmonary infarction. No evidence of right heart strain. 2. Areas of subsegmental atelectasis in the lungs. No evidence of pneumonia or edema.   No alteration in management.  Truett Mainland, DO 02/07/2015 6:37 PM

## 2015-02-08 ENCOUNTER — Encounter (HOSPITAL_COMMUNITY): Payer: Self-pay | Admitting: Family Medicine

## 2015-02-08 DIAGNOSIS — D72829 Elevated white blood cell count, unspecified: Secondary | ICD-10-CM

## 2015-02-08 DIAGNOSIS — J449 Chronic obstructive pulmonary disease, unspecified: Secondary | ICD-10-CM

## 2015-02-08 DIAGNOSIS — I2699 Other pulmonary embolism without acute cor pulmonale: Secondary | ICD-10-CM | POA: Diagnosis not present

## 2015-02-08 DIAGNOSIS — N179 Acute kidney failure, unspecified: Secondary | ICD-10-CM

## 2015-02-08 DIAGNOSIS — I82402 Acute embolism and thrombosis of unspecified deep veins of left lower extremity: Secondary | ICD-10-CM

## 2015-02-08 DIAGNOSIS — E860 Dehydration: Secondary | ICD-10-CM

## 2015-02-08 LAB — BASIC METABOLIC PANEL
ANION GAP: 7 (ref 5–15)
BUN: 19 mg/dL (ref 6–20)
CALCIUM: 10.8 mg/dL — AB (ref 8.9–10.3)
CHLORIDE: 103 mmol/L (ref 101–111)
CO2: 25 mmol/L (ref 22–32)
CREATININE: 0.81 mg/dL (ref 0.61–1.24)
GFR calc non Af Amer: >60 mL/min (ref >60)
Glucose, Bld: 95 mg/dL (ref 65–99)
Potassium: 4.4 mmol/L (ref 3.5–5.1)
SODIUM: 135 mmol/L (ref 135–145)

## 2015-02-08 MED ORDER — APIXABAN 5 MG PO TABS: ORAL_TABLET | ORAL | Status: DC

## 2015-02-08 MED ORDER — MORPHINE SULFATE (PF) 4 MG/ML IV SOLN
3.0000 mg | Freq: Once | INTRAVENOUS | Status: AC
  Administered 2015-02-09: 3 mg via INTRAVENOUS
  Filled 2015-02-08: qty 1

## 2015-02-08 NOTE — Progress Notes (Signed)
Pt is complaining of excruciating pain in the right upper quadrant. Pt states that he has never had pain like this and that it woke him up from his sleep. Spoke with Dr.Harduk who states that she will enter order for a stat xray and EKG. PT vitals are stable. Will continue to monitor

## 2015-02-08 NOTE — Progress Notes (Signed)
PROGRESS NOTE  Antonio DONAGHEY JHE:[id number] DOB: Sep 12, 1955 DOA: 02/07/2015 PCP: Elyn Aquas  Summary: 33 yom with a hx of HTN, COPD, chronic pain was recently hospitalized for acute respiratory failure with COPD exacerbation presented with left leg pain after a fall. Imaging revealed acute LLE DVT and labs suggested AKI. He was admitted for evaluation and treatment   Assessment/Plan: 1. Acute right sided PE. No hypoxia. Tachycardiac. No evidence of heart strain. Hemodynamics stable. Continue Lovenox. Discussed risk/benefit of Warfarin vs. NOAC. Pt elects NOAC as long as cost is reasonable. 2. Acute DVT LLE.  3. AKI. Resolved.  4. Dehydration. Resolved with IVF. 5. Asthma/COPD on roflumilast, Breo Ellipta, albuterol, iptratropium. Stable  6. Chronic leukocytosis dating back to 2013. Etiology unclear. Suggest outpatient follow-up. 7. HTN. Continue home medications. Stable  8. Chronic pain. On opiates.    Overall improving, hopefully can start on Eliquis depending on benefit check  Anticipate discharge 1-2 days  Code Status: FULL DVT prophylaxis: Lovenox Family Communication: patient is alone, no questions at this time  Disposition Plan: Home once improved. anticipate discharge in 1-2 days   Murray Hodgkins, MD  [facility]  Pager [phone] If 7PM-7AM, please contact night-coverage at [email or web address], password [facility] 02/08/2015, 7:09 AM  LOS: 1 day   Consultants:    Procedures:    Antibiotics:     HPI/Subjective: Feels okay, able to sleep some. Eating okay. No n/v, sob, cp. Left calf aches.   Objective: Filed Vitals:   02/07/15 1713 02/07/15 1837 02/07/15 2100 02/08/15 0549  BP: 138/77 90/44 128/79 113/72  Pulse: 114 126 115 112  Temp: 97.9 F (36.6 C) 99.2 F (37.3 C) 98.8 F (37.1 C) 98 F (36.7 C)  TempSrc: Oral Oral Oral Oral  Resp: 14 20 20 18   Height:      Weight:      SpO2: 97% 94% 96% 98%    Intake/Output Summary (Last 24 hours) at 02/08/15  0709 Last data filed at 02/08/15 0543  Gross per 24 hour  Intake      0 ml  Output   1200 ml  Net  -1200 ml     Filed Weights   02/07/15 1108  Weight: 100.245 kg (221 lb)    Exam:     Afebrile, no hypoxia  General:  Appears calm and comfortable, lying in bed Eyes: PERRL, normal lids, irises & conjunctiva ENT: grossly normal hearing, lips & tongue Cardiovascular: Tachycardic RR, no m/r/g. No LE edema. Telemetry: ST, no arrhythmias  Respiratory: CTA bilaterally, no w/r/r. Normal respiratory effort. Skin: no rash or induration seen on limited exam Musculoskeletal: grossly normal tone BUE/BLE Psychiatric: grossly normal mood and affect, speech fluent and appropriate  New data reviewed:  BMP unremarkable   Pertinent data since admission:  CTA chest: Positive for right lower lobe segmental pulmonary emboli, nonocclusive. No pulmonary infarction. No evidence of right heart strain.  LLE venous doppler u/s: Occlusive LEFT popliteal vein DVT.  EKG ST, no acute changes  Pending data:    Scheduled Meds: . allopurinol  300 mg Oral q morning - 10a  . enoxaparin (LOVENOX) injection  100 mg Subcutaneous Q12H  . Fluticasone Furoate-Vilanterol  1 puff Inhalation Daily  . gabapentin  300 mg Oral TID  . irbesartan  300 mg Oral Daily   And  . hydrochlorothiazide  25 mg Oral Daily  . Influenza vac split quadrivalent PF  0.5 mL Intramuscular Tomorrow-1000  . pneumococcal 23 valent vaccine  0.5 mL Intramuscular Tomorrow-1000  .  roflumilast  500 mcg Oral Daily  . simvastatin  40 mg Oral QPM  . Umeclidinium Bromide  1 puff Inhalation Daily   Continuous Infusions:   Principal Problem:   Acute pulmonary embolism Active Problems:   COPD exacerbation   AKI (acute kidney injury)   Left leg DVT   Dehydration   Leukocytosis   Time spent 20 minutes  By signing my name below, I, Arielle Khosrowpour, attest that this documentation has been prepared under the direction and in the  presence of Mirelle Biskup P. Sarajane Jews, MD. Electronically signed: Salvadore Oxford. 02/08/2015  I personally performed the services described in this documentation. All medical record entries made by the scribe were at my direction. I have reviewed the chart and agree that the record reflects my personal performance and is accurate and complete. Murray Hodgkins, MD

## 2015-02-09 ENCOUNTER — Inpatient Hospital Stay (HOSPITAL_COMMUNITY): Payer: Medicare (Managed Care)

## 2015-02-09 DIAGNOSIS — J449 Chronic obstructive pulmonary disease, unspecified: Secondary | ICD-10-CM

## 2015-02-09 LAB — COMPREHENSIVE METABOLIC PANEL
ALK PHOS: 83 U/L (ref 38–126)
ALT: 35 U/L (ref 17–63)
AST: 30 U/L (ref 15–41)
Albumin: 3.9 g/dL (ref 3.5–5.0)
Anion gap: 9 (ref 5–15)
BUN: 18 mg/dL (ref 6–20)
CHLORIDE: 99 mmol/L — AB (ref 101–111)
CO2: 24 mmol/L (ref 22–32)
CREATININE: 0.81 mg/dL (ref 0.61–1.24)
Calcium: 11.7 mg/dL — ABNORMAL HIGH (ref 8.9–10.3)
GFR calc Af Amer: >60 mL/min (ref >60)
GFR calc non Af Amer: >60 mL/min (ref >60)
Glucose, Bld: 135 mg/dL — ABNORMAL HIGH (ref 65–99)
Potassium: 4.1 mmol/L (ref 3.5–5.1)
SODIUM: 132 mmol/L — AB (ref 135–145)
Total Bilirubin: 0.8 mg/dL (ref 0.3–1.2)
Total Protein: 7.7 g/dL (ref 6.5–8.1)

## 2015-02-09 LAB — CBC WITH DIFFERENTIAL/PLATELET
Basophils Absolute: 0 10*3/uL (ref 0.0–0.1)
Basophils Relative: 0 %
EOS ABS: 0.2 10*3/uL (ref 0.0–0.7)
Eosinophils Relative: 1 %
HCT: 46.9 % (ref 39.0–52.0)
Hemoglobin: 16.2 g/dL (ref 13.0–17.0)
LYMPHS ABS: 3.6 10*3/uL (ref 0.7–4.0)
Lymphocytes Relative: 19 %
MCH: 32.7 pg (ref 26.0–34.0)
MCHC: 34.5 g/dL (ref 30.0–36.0)
MCV: 94.6 fL (ref 78.0–100.0)
MONO ABS: 1.5 10*3/uL — AB (ref 0.1–1.0)
MONOS PCT: 8 %
Neutro Abs: 13.5 10*3/uL — ABNORMAL HIGH (ref 1.7–7.7)
Neutrophils Relative %: 72 %
PLATELETS: 238 10*3/uL (ref 150–400)
RBC: 4.96 MIL/uL (ref 4.22–5.81)
RDW: 12.7 % (ref 11.5–15.5)
WBC: 18.8 10*3/uL — ABNORMAL HIGH (ref 4.0–10.5)

## 2015-02-09 LAB — MAGNESIUM: Magnesium: 2.1 mg/dL (ref 1.7–2.4)

## 2015-02-09 LAB — AMYLASE: Amylase: 39 U/L (ref 28–100)

## 2015-02-09 LAB — LIPASE, BLOOD: LIPASE: 19 U/L — AB (ref 22–51)

## 2015-02-09 LAB — TROPONIN I: Troponin I: <0.03 ng/mL (ref <0.031)

## 2015-02-09 MED ORDER — SODIUM CHLORIDE 0.9 % IV SOLN
INTRAVENOUS | Status: DC
  Administered 2015-02-09 – 2015-02-10 (×2): via INTRAVENOUS

## 2015-02-09 MED ORDER — APIXABAN 5 MG PO TABS
10.0000 mg | ORAL_TABLET | Freq: Two times a day (BID) | ORAL | Status: DC
  Administered 2015-02-09 – 2015-02-10 (×3): 10 mg via ORAL
  Filled 2015-02-09 (×3): qty 2

## 2015-02-09 NOTE — Progress Notes (Signed)
Informed by central telemetry that patient had 19 beat run vtach. Assessed patient and he was asymptomatic. Pt denies chest pain or discomfort and vital signs within defined limits. MD E-Paged.

## 2015-02-09 NOTE — Progress Notes (Signed)
Notified by MD that magnesium level ordered.

## 2015-02-09 NOTE — Progress Notes (Signed)
PROGRESS NOTE  Antonio Baker QGB:[id number] DOB: 01-26-1956 DOA: 02/07/2015 PCP: Elyn Aquas  Summary: 56 yom with a hx of COPD and chronic pain  presented with right sided epigastric / RUQ pain. Pain is a stabbing that is improved with belching.  LE [place] in the ED revealed an acute LLE DVT. CT angiogram of the chest revealed right lower lobe segmental pulmonary emboli, nonocclusive.Abdominal xray revealed air-fluid levels throughout the stomach, small and large bowel without abnormal bowel distention. Findings may reflect enteritis or ileus. Labs consistent with AKI. Admitted for further evaluation and management.   Patient was recently hospitalized for acute respiratory failure with COPD exacerbation.  Assessment/Plan: 1. Acute right sided PE. No hypoxia. Remains tachycardic. No evidence of heart strain. Hemodynamics stable. Discontinue Lovenox and start NOAC. 2. Acute DVT LLE. Continue anticoagulation. 3. Ileus. Improved. Continue clears today. Advance in AM if improved. 4. AKI. Resolved.  5. Hypercalcemia, likely due to dehydration and HCTZ. Continue IVF and monitor 6. Asthma/COPD on roflumilast, Breo Ellipta, albuterol, iptratropium. Remains stable  7. Chronic leukocytosis dating back to 2013. Etiology unclear. Suggest outpatient follow-up. 8. HTN. Continue home medications. Remains stable  9. Chronic pain. On opiates.    Overall improved. Plan to change to NOAC  Repeat BMP in the morning and continue IVF  Continue clear liquids for mild ileus without n/v.   Stop HCTZ  Likely advance diet tomorrow and discharge home.   Code Status: FULL DVT prophylaxis: Lovenox Family Communication: No family at bedside. Discussed with patient who understands and has no concerns at this time. Disposition Plan: Anticipate discharge home within 24 hours.  Murray Hodgkins, MD  [facility]  Pager [phone] If 7PM-7AM, please contact night-coverage at [email or web address], password  [facility] 02/09/2015, 6:47 AM  LOS: 2 days   Consultants:    Procedures:    Antibiotics:     HPI/Subjective: RUQ and epigastric pain last night, reports intermittent and chronic >1 year. No vomiting.  No pain now, feeling improved. No n/v. Moderate LLE pain while ambulating. Denies CP or SOB.   Objective: Filed Vitals:   02/08/15 1432 02/08/15 2035 02/08/15 2355 02/09/15 0535  BP: 86/52 105/78 120/74 144/74  Pulse: 120 123 124 126  Temp: 97.9 F (36.6 C) 98.3 F (36.8 C) 98.2 F (36.8 C) 97.8 F (36.6 C)  TempSrc: Oral Oral Oral Oral  Resp: 18 19 20 18   Height:      Weight:      SpO2: 97% 96% 95% 98%    Intake/Output Summary (Last 24 hours) at 02/09/15 0647 Last data filed at 02/08/15 1755  Gross per 24 hour  Intake    720 ml  Output    850 ml  Net   -130 ml     Filed Weights   02/07/15 1108  Weight: 100.245 kg (221 lb)    Exam:    VSS, mild tachycardia, not hypoxic, afebrile General:  Appears comfortable, calm. Laying flat in bed Cardiovascular: Regular rhythm, tachycardic rate.  no murmur, rub or gallop. Some LLE edema around the popliteal fossa, no RLE edema Respiratory: Clear to auscultation bilaterally, no wheezes, rales or rhonchi. Normal respiratory effort. Abdomen: soft, ntnd, no RUQ or epigastric tenderness, positive bowel sounds Psychiatric: grossly normal mood and affect, speech fluent and appropriate  New data reviewed:  CMP unremarkable  Lipase normal  Troponin negative  WBC improving, 18.8  Calcium 11.7   Pertinent data since admission:  CTA chest: Positive for right lower lobe segmental pulmonary emboli, nonocclusive.  No pulmonary infarction. No evidence of right heart strain.  LLE venous doppler u/s: Occlusive LEFT popliteal vein DVT.  EKG ST, no acute changes  Abdominal CXR revealed air-fluid levels throughout the stomach, small and large bowel without abnormal bowel distention. Findings may reflect enteritis or ileus. No  disproportionate bowel dilatation to suggest obstruction.  Pending data:    Scheduled Meds: . allopurinol  300 mg Oral q morning - 10a  . enoxaparin (LOVENOX) injection  100 mg Subcutaneous Q12H  . Fluticasone Furoate-Vilanterol  1 puff Inhalation Daily  . gabapentin  300 mg Oral TID  . irbesartan  300 mg Oral Daily   And  . hydrochlorothiazide  25 mg Oral Daily  . roflumilast  500 mcg Oral Daily  . simvastatin  40 mg Oral QPM  . Umeclidinium Bromide  1 puff Inhalation Daily   Continuous Infusions:   Principal Problem:   Acute pulmonary embolism Active Problems:   COPD exacerbation   AKI (acute kidney injury)   Left leg DVT   Dehydration   Leukocytosis   COPD (chronic obstructive pulmonary disease)   Hypercalcemia   Time spent 25 minutes  By signing my name below, I, Rosalie Doctor attest that this documentation has been prepared under the direction and in the presence of Murray Hodgkins, MD Electronically signed: Rosalie Doctor, Scribe.  02/09/2015  I personally performed the services described in this documentation. All medical record entries made by the scribe were at my direction. I have reviewed the chart and agree that the record reflects my personal performance and is accurate and complete. Murray Hodgkins, MD

## 2015-02-09 NOTE — Discharge Instructions (Signed)
Apixaban oral tablets What is this medicine? APIXABAN (a PIX a ban) is an anticoagulant (blood thinner). It is used to lower the chance of stroke in people with a medical condition called atrial fibrillation. It is also used to treat or prevent blood clots in the lungs or in the veins. This medicine may be used for other purposes; ask your health care provider or pharmacist if you have questions. COMMON BRAND NAME(S): Eliquis What should I tell my health care provider before I take this medicine? They need to know if you have any of these conditions: -bleeding disorders -bleeding in the brain -blood in your stools (black or tarry stools) or if you have blood in your vomit -history of stomach bleeding -kidney disease -liver disease -mechanical heart valve -an unusual or allergic reaction to apixaban, other medicines, foods, dyes, or preservatives -pregnant or trying to get pregnant -breast-feeding How should I use this medicine? Take this medicine by mouth with a glass of water. Follow the directions on the prescription label. You can take it with or without food. If it upsets your stomach, take it with food. Take your medicine at regular intervals. Do not take it more often than directed. Do not stop taking except on your doctor's advice. Stopping this medicine may increase your risk of a blot clot. Be sure to refill your prescription before you run out of medicine. Talk to your pediatrician regarding the use of this medicine in children. Special care may be needed. Overdosage: If you think you have taken too much of this medicine contact a poison control center or emergency room at once. NOTE: This medicine is only for you. Do not share this medicine with others. What if I miss a dose? If you miss a dose, take it as soon as you can. If it is almost time for your next dose, take only that dose. Do not take double or extra doses. What may interact with this medicine? This medicine may  interact with the following: -aspirin and aspirin-like medicines -certain medicines for fungal infections like ketoconazole and itraconazole -certain medicines for seizures like carbamazepine and phenytoin -certain medicines that treat or prevent blood clots like warfarin, enoxaparin, and dalteparin -clarithromycin -NSAIDs, medicines for pain and inflammation, like ibuprofen or naproxen -rifampin -ritonavir -St. John's wort This list may not describe all possible interactions. Give your health care provider a list of all the medicines, herbs, non-prescription drugs, or dietary supplements you use. Also tell them if you smoke, drink alcohol, or use illegal drugs. Some items may interact with your medicine. What should I watch for while using this medicine? Notify your doctor or health care professional and seek emergency treatment if you develop breathing problems; changes in vision; chest pain; severe, sudden headache; pain, swelling, warmth in the leg; trouble speaking; sudden numbness or weakness of the face, arm, or leg. These can be signs that your condition has gotten worse. If you are going to have surgery, tell your doctor or health care professional that you are taking this medicine. Tell your health care professional that you use this medicine before you have a spinal or epidural procedure. Sometimes people who take this medicine have bleeding problems around the spine when they have a spinal or epidural procedure. This bleeding is very rare. If you have a spinal or epidural procedure while on this medicine, call your health care professional immediately if you have back pain, numbness or tingling (especially in your legs and feet), muscle weakness, paralysis, or loss   of bladder or bowel control. Avoid sports and activities that might cause injury while you are using this medicine. Severe falls or injuries can cause unseen bleeding. Be careful when using sharp tools or knives. Consider using  an electric razor. Take special care brushing or flossing your teeth. Report any injuries, bruising, or red spots on the skin to your doctor or health care professional. What side effects may I notice from receiving this medicine? Side effects that you should report to your doctor or health care professional as soon as possible: -allergic reactions like skin rash, itching or hives, swelling of the face, lips, or tongue -signs and symptoms of bleeding such as bloody or black, tarry stools; red or dark-brown urine; spitting up blood or brown material that looks like coffee grounds; red spots on the skin; unusual bruising or bleeding from the eye, gums, or nose This list may not describe all possible side effects. Call your doctor for medical advice about side effects. You may report side effects to FDA at 1-800-FDA-1088. Where should I keep my medicine? Keep out of the reach of children. Store at room temperature between 20 and 25 degrees C (68 and 77 degrees F). Throw away any unused medicine after the expiration date. NOTE: This sheet is a summary. It may not cover all possible information. If you have questions about this medicine, talk to your doctor, pharmacist, or health care provider.  2015, Elsevier/Gold Standard. (2013-01-12 11:59:24)  

## 2015-02-09 NOTE — Progress Notes (Signed)
Spoke with Dr. Marin Comment regarding xray results. He order for the pt's diet to be changed to clear liquids. Will continue to monitor

## 2015-02-10 LAB — BASIC METABOLIC PANEL
ANION GAP: 4 — AB (ref 5–15)
BUN: 14 mg/dL (ref 6–20)
CO2: 25 mmol/L (ref 22–32)
Calcium: 11.5 mg/dL — ABNORMAL HIGH (ref 8.9–10.3)
Chloride: 100 mmol/L — ABNORMAL LOW (ref 101–111)
Creatinine, Ser: 0.79 mg/dL (ref 0.61–1.24)
Glucose, Bld: 102 mg/dL — ABNORMAL HIGH (ref 65–99)
POTASSIUM: 5.2 mmol/L — AB (ref 3.5–5.1)
SODIUM: 129 mmol/L — AB (ref 135–145)

## 2015-02-10 MED ORDER — ACETAMINOPHEN 500 MG PO TABS: 500.0000 mg | ORAL_TABLET | Freq: Four times a day (QID) | ORAL | Status: DC | PRN

## 2015-02-10 NOTE — Progress Notes (Signed)
PROGRESS NOTE  Antonio Baker EXB:[id number] DOB: 12/18/1955 DOA: 02/07/2015 PCP: Elyn Aquas  Summary: 62 yom with a hx of COPD and chronic pain  presented with right sided epigastric / RUQ pain. Pain is a stabbing that is improved with belching.  LE [place] in the ED revealed an acute LLE DVT. CT angiogram of the chest revealed right lower lobe segmental pulmonary emboli, nonocclusive.Abdominal xray revealed air-fluid levels throughout the stomach, small and large bowel without abnormal bowel distention. Findings may reflect enteritis or ileus. Labs consistent with AKI. Admitted for further evaluation and management.   Patient was recently hospitalized for acute respiratory failure with COPD exacerbation.  Assessment/Plan: 1. Acute right sided PE. No hypoxia. No evidence of heart strain. Hemodynamics remained stable. Continue NOAC. 2. Acute DVT LLE. Continue anticoagulation as outpatient.  3. Ileus, resolved. Able to tolerate a normal diet with no nausea or vomiting.  4. AKI. Resolved.  5. Hypercalcemia, modest hyperkalemia, likely secondary to increased dose of HCTZ and ARB respectively recently as an outpatient. Recent admission with normal calcium and potassium, timing correlates. Asymptomatic. Discontinue HCTZ and ARB.   6. Asthma/COPD on roflumilast, Breo Ellipta, albuterol, iptratropium. Stable. 7. Chronic leukocytosis dating back to 2013. Etiology unclear. Suggest outpatient follow-up. 8. HTN, stable. 9. Chronic pain. On opiates.    Home today on Eliquis  Stop ARB/HCTZ  Recommend outpatient follow up in 1 week  Follow up hypercalcemia, likely due to HCTZ. Recommend follow up BMP at that time.   Code Status: FULL DVT prophylaxis: Eliquis Family Communication: None at bedside. Discussed with patient who understands and has no concerns at this time. Disposition Plan: Anticipate discharge today.   Murray Hodgkins, MD  [facility]  Pager [phone] If 7PM-7AM, please contact  night-coverage at [email or web address], password [facility] 02/10/2015, 6:43 AM  LOS: 3 days   Consultants:    Procedures:    Antibiotics:     HPI/Subjective: Able to tolerate normal diet. Denies any nausea, vomiting. Mild right sided abdominal pain. Burning in his LLE. Breathing well, no SOB with ambulation.  Able to ambulate with mild LLE pain.   Objective: Filed Vitals:   02/09/15 1434 02/09/15 1602 02/09/15 2145 02/10/15 0425  BP: 87/58 101/73 110/61 123/80  Pulse: 102 113 106 108  Temp: 97.7 F (36.5 C)  98.2 F (36.8 C) 97.7 F (36.5 C)  TempSrc: Oral  Oral Oral  Resp: 18 18 18    Height:      Weight:      SpO2: 96% 100% 100% 96%    Intake/Output Summary (Last 24 hours) at 02/10/15 0643 Last data filed at 02/10/15 0425  Gross per 24 hour  Intake    240 ml  Output   1250 ml  Net  -1010 ml     Filed Weights   02/07/15 1108  Weight: 100.245 kg (221 lb)    Exam:    VSS, mild tachycardia, afebrile, not hypoxic General:  Appears calm and comfortable Cardiovascular: regular rhythm, tachycardic, no m/r/g. No RLE edema,  Telemetry: ST  Respiratory: CTA bilaterally, no w/r/r. Normal respiratory effort. Abdomen: soft, ntnd Musculoskeletal: grossly normal tone BUE/BLE Psychiatric: grossly normal mood and affect, speech fluent and appropriate  New data reviewed:  Potassium slightly elevated 5.2  Sodium slightly low 129  Calcium slowly improving, 11.5  BUN and Creatinine WNL   Pertinent data since admission:  CTA chest: Positive for right lower lobe segmental pulmonary emboli, nonocclusive. No pulmonary infarction. No evidence of right heart strain.  LLE  venous doppler u/s: Occlusive LEFT popliteal vein DVT.  EKG ST, no acute changes  Abdominal CXR revealed air-fluid levels throughout the stomach, small and large bowel without abnormal bowel distention. Findings may reflect enteritis or ileus. No disproportionate bowel dilatation to suggest  obstruction.  Pending data:    Scheduled Meds: . allopurinol  300 mg Oral q morning - 10a  . apixaban  10 mg Oral BID  . Fluticasone Furoate-Vilanterol  1 puff Inhalation Daily  . gabapentin  300 mg Oral TID  . irbesartan  300 mg Oral Daily  . roflumilast  500 mcg Oral Daily  . simvastatin  40 mg Oral QPM  . Umeclidinium Bromide  1 puff Inhalation Daily   Continuous Infusions: . sodium chloride 50 mL/hr at 02/09/15 1402    Principal Problem:   Acute pulmonary embolism Active Problems:   COPD exacerbation   AKI (acute kidney injury)   Left leg DVT   Dehydration   Leukocytosis   COPD (chronic obstructive pulmonary disease)   Hypercalcemia    By signing my name below, I, Rosalie Doctor attest that this documentation has been prepared under the direction and in the presence of Murray Hodgkins, MD Electronically signed: Rosalie Doctor, Scribe.  02/10/2015  I personally performed the services described in this documentation. All medical record entries made by the scribe were at my direction. I have reviewed the chart and agree that the record reflects my personal performance and is accurate and complete. Murray Hodgkins, MD

## 2015-02-10 NOTE — Discharge Summary (Signed)
Physician Discharge Summary  Antonio Baker JKD:[id number] DOB: 01/01/1956 DOA: 02/07/2015  PCP: Elyn Aquas  Admit date: 02/07/2015 Discharge date: 02/10/2015  Recommendations for Outpatient Follow-up:  1. Follow up with PCP in 1-2 weeks for PE, DVT, hypercalcemia, and hyperkalemia. See below. consider BMP at followup.  2. Discontinued ARB/HCTZ, see below 3. Continue taking Eliquis  4. Follow-up chronic leukocytosis of unclear etiology, could consider outpatient hematology evaluation   Follow-up Information    Follow up with [name], [name]. Schedule an appointment as soon as possible for a visit in 1 week.   Specialty:  Family Medicine   Contact information:   [address] [place] [address] [phone]       Discharge Diagnoses:  1. Acute right sided PE.  2. Acute DVT LLE.  3. Ileus. 4. AKI.  5. Hypercalcemia, modest hyperkalemia..  6. Asthma/COPD. 7. Chronic leukocytosis. 8. HTN. 9. Chronic pain  Discharge Condition: Improved Disposition: Discharge home  Diet recommendation: Regular  Filed Weights   02/07/15 1108  Weight: 100.245 kg (221 lb)    History of present illness:  60 yom with a hx of COPD and chronic painpresented with right sided epigastric / RUQ pain. Pain was a stabbing that was improved with belching. LE [place] in the ED revealed an acute LLE DVT. CT angiogram of the chest revealed right lower lobe segmental pulmonary emboli, nonocclusive.  Hospital Course:  Acute right sided PE and LLE DVT were treated with Lovenox and after discussion of risk/benefit of VKI vs NOAC, patient elected NOAC, understanding that there is no reversal agent for Eliquis. Transitioned to oral Eliquis upon discharge. Hemodynamics remained stable throughout hospitalization. Transient Ileus was resolved with IVF and bowel rest. AKI resolved with aggressive IVF. Hypercalcemia in the setting of dehydration and HCTZ improved with IVF. Discontinued HCTZ during admission and  upon discharge. Modest hyperkalemia in the presence of ARB, discontinued ARB upon discharge.   Individual issues as below:  1. Acute right sided PE. No hypoxia. No evidence of heart strain. Hemodynamics remained stable. Continue NOAC. 2. Acute DVT LLE. Continue anticoagulation as outpatient.  3. Ileus, resolved. Able to tolerate a normal diet with no nausea or vomiting.  4. AKI. Resolved.  5. Hypercalcemia, modest hyperkalemia, likely secondary to increased dose of HCTZ and ARB respectively recently as an outpatient. Recent admission with normal calcium and potassium, timing correlates. Asymptomatic. Discontinue HCTZ and ARB.  6. Asthma/COPD on roflumilast, Breo Ellipta, albuterol, iptratropium. Stable. 7. Chronic leukocytosis dating back to 2013. Etiology unclear. Suggest outpatient follow-up. 8. HTN, stable. 9. Chronic pain. On opiates.  Consultants:  None   Procedures:  None   Antibiotics:  None    Discharge Instructions Discharge Instructions    Diet - low sodium heart healthy    Complete by:  As directed      Discharge instructions    Complete by:  As directed   Call your physician or seek immediate medical attention for chest pain, shortness of breath, bleeding, increased pain, confusion, bone pain or worsening of condition.     Increase activity slowly    Complete by:  As directed             Discharge Medication List as of 02/10/2015  2:49 PM    START taking these medications   Details  apixaban (ELIQUIS) 5 MG TABS tablet Take 10 mg PO BID x 7days, then 5 mg PO BID thereafter., Normal      CONTINUE these medications which have  CHANGED   Details  acetaminophen (TYLENOL) 500 MG tablet Take 1 tablet (500 mg total) by mouth every 6 (six) hours as needed for mild pain., Starting 02/10/2015, Until Discontinued, Normal      CONTINUE these medications which have NOT CHANGED   Details  albuterol (PROVENTIL HFA;VENTOLIN HFA) 108 (90 BASE) MCG/ACT inhaler Inhale  2 puffs into the lungs 3 (three) times daily. And every 4 hours as needed for wheezing, chest congestion, or shortness of breath., Starting 05/17/2012, Until Discontinued, Print    albuterol (PROVENTIL) (5 MG/ML) 0.5% nebulizer solution Take 1 mL (5 mg total) by nebulization every 4 (four) hours as needed. For shortness of breath/wheezing, Starting 06/14/2012, Until Discontinued, Print    allopurinol (ZYLOPRIM) 300 MG tablet Take 300 mg by mouth every morning. , Until Discontinued, Historical Med    BREO ELLIPTA 100-25 MCG/INH AEPB Inhale 1 puff into the lungs daily., Starting 01/30/2015, Until Discontinued, Historical Med    cyclobenzaprine (FLEXERIL) 10 MG tablet Take 10 mg by mouth 3 (three) times daily as needed. For spasms, Until Discontinued, Historical Med    gabapentin (NEURONTIN) 300 MG capsule Take 300 mg by mouth 3 (three) times daily. , Starting 12/26/2014, Until Discontinued, Historical Med    guaiFENesin (MUCINEX) 600 MG 12 hr tablet Take 1 tablet (600 mg total) by mouth 2 (two) times daily., Starting 01/27/2015, Until Discontinued, Normal    HYDROcodone-acetaminophen (NORCO) 10-325 MG per tablet Take 1 tablet by mouth every 6 (six) hours as needed., Until Discontinued, Historical Med    INCRUSE ELLIPTA 62.5 MCG/INH AEPB Inhale 1 puff into the lungs daily., Starting 02/05/2015, Until Discontinued, Historical Med    ipratropium (ATROVENT) 0.02 % nebulizer solution Take 2.5 mLs (500 mcg total) by nebulization 4 (four) times daily., Starting 07/04/2012, Until Discontinued, Print    roflumilast (DALIRESP) 500 MCG TABS tablet Take 500 mcg by mouth daily., Until Discontinued, Historical Med    simvastatin (ZOCOR) 40 MG tablet Take 40 mg by mouth every evening., Until Discontinued, Historical Med      STOP taking these medications     valsartan-hydrochlorothiazide (DIOVAN-HCT) 320-25 MG per tablet        No Known Allergies  The results of significant diagnostics from this  hospitalization (including imaging, microbiology, ancillary and laboratory) are listed below for reference.    Significant Diagnostic Studies: Ct Angio Chest Pe W/cm &/or Wo Cm  02/07/2015   CLINICAL DATA:  Pt reports frequent falls for last five months. Pt reports intermittent dizziness for last several days when standing up. Pt reports last fall was last night. Pt reports left knee pain ever since. No deformity noted. History of a blood clot behind the left knee.  EXAM: CT ANGIOGRAPHY CHEST WITH CONTRAST  TECHNIQUE: Multidetector CT imaging of the chest was performed using the standard protocol during bolus administration of intravenous contrast. Multiplanar CT image reconstructions and MIPs were obtained to evaluate the vascular anatomy.  CONTRAST:  22mL OMNIPAQUE IOHEXOL 350 MG/ML SOLN  COMPARISON:  Chest radiograph, 01/25/2015.  Chest CT, 06/10/2012.  FINDINGS: Angiographic study: Acute pulmonary emboli are noted in the right lower lobe segmental branches. Emboli are noted in the posterior basilar, lateral basilar and antral basilar segments. Emboli are nonocclusive. No other discrete pulmonary emboli noted.  No evidence of right ventricular strain.  Aorta is normal in caliber.  No dissection.  Thoracic inlet:  No masses or adenopathy.  Thyroid is unremarkable.  Mediastinum and hila: Heart borderline enlarged. Moderate coronary artery calcifications. There are  scattered sub cm mediastinal lymph nodes. No masses or pathologically enlarged lymph nodes. Right suprahilar lymph node measures 11 mm in short axis. This is similar to the prior study.  Lungs and pleura: Mild dependent subsegmental atelectasis. Mild scarring noted at the apices. No convincing pneumonia and no pulmonary edema. No mass or suspicious nodule. No pleural effusion or pneumothorax. No evidence of pulmonary infarction.  Limited upper abdomen: Fatty infiltration of the liver. No acute findings.  Musculoskeletal: Mild wedge-shaped compression  deformity of a mid thoracic vertebra, stable. No osteoblastic or osteolytic lesions.  Review of the MIP images confirms the above findings.  IMPRESSION: 1. Positive for right lower lobe segmental pulmonary emboli, nonocclusive. No pulmonary infarction. No evidence of right heart strain. 2. Areas of subsegmental atelectasis in the lungs. No evidence of pneumonia or edema.   Electronically Signed   By: Lajean Manes M.D.   On: 02/07/2015 17:56   US Venous Img Lower Unilateral Left  02/07/2015   CLINICAL DATA:  Left knee calf pain x3 days, fell 2 days ago  EXAM: LEFT LOWER EXTREMITY VENOUS DOPPLER ULTRASOUND  TECHNIQUE: Gray-scale sonography with compression, as well as color and duplex ultrasound, were performed to evaluate the deep venous system from the level of the common femoral vein through the popliteal and proximal calf veins.  COMPARISON:  None  FINDINGS: echogenic thrombus in the popliteal vein, which is noncompressible. no flow is noted on color doppler through this segment. more proximally, the common femoral, profunda femoral, and femoral veins remain patent and compressible. Visualized segments of the saphenous venous system normal in caliber and compressibility. Visualized posterior tibial and calf veins unremarkable. Survey views of the contralateral common femoral vein are unremarkable.  IMPRESSION: 1. Occlusive LEFT popliteal vein DVT.   Electronically Signed   By: Lucrezia Europe M.D.   On: 02/07/2015 14:51   Dg Abd Acute W/chest  02/09/2015   CLINICAL DATA:  Sudden onset of epigastric pain.  EXAM: DG ABDOMEN ACUTE W/ 1V CHEST  COMPARISON:  Chest CT 2 days prior.  FINDINGS: The cardiomediastinal contours are normal. Minimal atelectasis in the upper and lower lobes. There is no free intra-abdominal air. There is an air-fluid level in the stomach. Air-fluid levels throughout large and small bowel loops which are not abnormally distended. No radiopaque calculi. No acute osseous abnormalities are seen.   IMPRESSION: Air-fluid levels throughout the stomach, small and large bowel without abnormal bowel distention. Findings may reflect enteritis or ileus. No disproportionate bowel dilatation to suggest obstruction.   Electronically Signed   By: Jeb Levering M.D.   On: 02/09/2015 01:53     Labs: Basic Metabolic Panel:  Recent Labs Lab 02/07/15 1320 02/08/15 0559 02/09/15 0053 02/09/15 1624 02/10/15 0800  NA 134* 135 132*  --  129*  K 4.7 4.4 4.1  --  5.2*  CL 100* 103 99*  --  100*  CO2 24 25 24   --  25  GLUCOSE 114* 95 135*  --  102*  BUN 32* 19 18  --  14  CREATININE 1.57* 0.81 0.81  --  0.79  CALCIUM 10.9* 10.8* 11.7*  --  11.5*  MG  --   --   --  2.1  --    Liver Function Tests:  Recent Labs Lab 02/09/15 0053  AST 30  ALT 35  ALKPHOS 83  BILITOT 0.8  PROT 7.7  ALBUMIN 3.9    Recent Labs Lab 02/09/15 0053  LIPASE 19*  AMYLASE 39  CBC:  Recent Labs Lab 02/07/15 1320 02/09/15 0053  WBC 20.4* 18.8*  NEUTROABS  --  13.5*  HGB 15.3 16.2  HCT 45.9 46.9  MCV 96.0 94.6  PLT 211 238   Cardiac Enzymes:  Recent Labs Lab 02/09/15 0053  TROPONINI <0.03      Recent Labs  01/25/15 0247  BNP 23.0     Principal Problem:   Acute pulmonary embolism Active Problems:   COPD exacerbation   AKI (acute kidney injury)   Left leg DVT   Dehydration   Leukocytosis   COPD (chronic obstructive pulmonary disease)   Hypercalcemia   Time coordinating discharge: 35 minutes  Signed:  Murray Hodgkins, MD [facility] 02/10/2015, 6:48 AM   By signing my name below, I, Rosalie Doctor attest that this documentation has been prepared under the direction and in the presence of Murray Hodgkins, MD Electronically signed: Rosalie Doctor, Scribe.  02/10/2015

## 2015-02-10 NOTE — Progress Notes (Signed)
Reassessed the patient after he ate his grits this am.  He tolerated the meal well AEB no complaints of nausea, vomiting, or pain after the meal.  I will advance him to regular diet for lunch per MD order ok to do so, and continue to monitor him.

## 2015-02-10 NOTE — Plan of Care (Signed)
Late Entry: 0830 02/10/2015  Notified Dr. Sarajane Jews to inquire if we could advance the patients diet from clear liquids.  MD states that if the patient felt like he could tolerate the diet advancement then we could since his previous abdomen test showed a possible ileus.  I went in to talk with the patient to assess him an he stated that the clears had a bland taste and he felt like he could tolerate  The advancement.  I ordered him full liquids and to start with grits per his preference.  I voiced to him that if he could tolerate the meal we could advance him again to regular food.  I will continue to monitor him.  His bowel sounds were normal and no abdominal distention was noted.

## 2015-02-10 NOTE — Care Management Note (Signed)
Case Management Note  Patient Details  Name: Antonio Baker MRN: [id number] Date of Birth: [birth year]  Subjective/Objective:                  Pt admitted from home with PE and DVT. Pt lives alone and will return home at discharge. Pt is independent with ADL's. Pt does have friends who asssit pt as needed.   Action/Plan: Pt for discharge home today. Benefits check noted copay for Eliquis $45 a month and pt is agreeable to that. Pt given the 30 day free card. Pt and pts nurse aware of discharge arrangements.  Expected Discharge Date:                  Expected Discharge Plan:  Home/Self Care  In-House Referral:  NA  Discharge planning Services  CM Consult  Post Acute Care Choice:  NA Choice offered to:  NA  DME Arranged:    DME Agency:     HH Arranged:    HH Agency:     Status of Service:  Completed, signed off  Medicare Important Message Given:  Yes-second notification given Date Medicare IM Given:    Medicare IM give by:    Date Additional Medicare IM Given:    Additional Medicare Important Message give by:     If discussed at [place] of Stay Meetings, dates discussed:    Additional Comments:  Joylene Draft, RN 02/10/2015, 11:18 AM

## 2015-02-10 NOTE — Care Management Important Message (Signed)
Important Message  Patient Details  Name: Antonio Baker MRN: [id number] Date of Birth: 1955-05-29   Medicare Important Message Given:  Yes-second notification given    Joylene Draft, RN 02/10/2015, 11:17 AM

## 2015-02-11 LAB — HEMOGLOBIN A1C
Hgb A1c MFr Bld: 5.6 % (ref 4.8–5.6)
Mean Plasma Glucose: 114 mg/dL

## 2015-07-24 ENCOUNTER — Other Ambulatory Visit (HOSPITAL_COMMUNITY): Payer: Self-pay | Admitting: Respiratory Therapy

## 2015-07-24 DIAGNOSIS — J441 Chronic obstructive pulmonary disease with (acute) exacerbation: Secondary | ICD-10-CM

## 2015-08-01 ENCOUNTER — Ambulatory Visit (HOSPITAL_COMMUNITY)
Admission: RE | Admit: 2015-08-01 | Discharge: 2015-08-01 | Disposition: A | Discharge status: Home / Self Care | Payer: PRIVATE HEALTH INSURANCE | Source: Ambulatory Visit | Attending: Family Medicine | Admitting: Family Medicine

## 2015-08-01 DIAGNOSIS — J449 Chronic obstructive pulmonary disease, unspecified: Secondary | ICD-10-CM | POA: Diagnosis not present

## 2015-08-01 MED ORDER — ALBUTEROL SULFATE (2.5 MG/3ML) 0.083% IN NEBU
2.5000 mg | INHALATION_SOLUTION | Freq: Once | RESPIRATORY_TRACT | Status: AC
  Administered 2015-08-01: 2.5 mg via RESPIRATORY_TRACT

## 2015-08-05 LAB — PULMONARY FUNCTION TEST
DL/VA % PRED: 70 %
DL/VA: 3.4 ml/min/mmHg/L
DLCO UNC % PRED: 57 %
DLCO unc: 21.87 ml/min/mmHg
FEF 25-75 POST: 3.38 L/s
FEF 25-75 PRE: 3.17 L/s
FEF2575-%CHANGE-POST: 6 %
FEF2575-%PRED-PRE: 97 %
FEF2575-%Pred-Post: 104 %
FEV1-%CHANGE-POST: 1 %
FEV1-%PRED-POST: 93 %
FEV1-%Pred-Pre: 92 %
FEV1-Post: 3.81 L
FEV1-Pre: 3.76 L
FEV1FVC-%CHANGE-POST: 9 %
FEV1FVC-%PRED-PRE: 101 %
FEV6-%Change-Post: -5 %
FEV6-%Pred-Post: 89 %
FEV6-%Pred-Pre: 94 %
FEV6-PRE: 4.86 L
FEV6-Post: 4.6 L
FEV6FVC-%Change-Post: 2 %
FEV6FVC-%PRED-PRE: 103 %
FEV6FVC-%Pred-Post: 105 %
FVC-%CHANGE-POST: -7 %
FVC-%Pred-Post: 85 %
FVC-%Pred-Pre: 91 %
FVC-PRE: 4.96 L
FVC-Post: 4.6 L
POST FEV1/FVC RATIO: 83 %
PRE FEV6/FVC RATIO: 98 %
Post FEV6/FVC ratio: 100 %
Pre FEV1/FVC ratio: 76 %
RV % pred: 139 %
RV: 3.49 L
TLC % pred: 110 %
TLC: 8.64 L

## 2015-09-13 ENCOUNTER — Other Ambulatory Visit (HOSPITAL_COMMUNITY): Payer: Self-pay | Admitting: Emergency Medicine

## 2015-09-13 ENCOUNTER — Emergency Department (HOSPITAL_COMMUNITY): Payer: Medicare (Managed Care)

## 2015-09-13 ENCOUNTER — Encounter (HOSPITAL_COMMUNITY): Payer: Self-pay | Admitting: Emergency Medicine

## 2015-09-13 ENCOUNTER — Emergency Department (HOSPITAL_COMMUNITY)
Admission: EM | Admit: 2015-09-13 | Discharge: 2015-09-13 | Disposition: A | Discharge status: Home / Self Care | Payer: Medicare (Managed Care) | Attending: Emergency Medicine | Admitting: Emergency Medicine

## 2015-09-13 DIAGNOSIS — Z87891 Personal history of nicotine dependence: Secondary | ICD-10-CM | POA: Diagnosis not present

## 2015-09-13 DIAGNOSIS — I82402 Acute embolism and thrombosis of unspecified deep veins of left lower extremity: Secondary | ICD-10-CM

## 2015-09-13 DIAGNOSIS — R197 Diarrhea, unspecified: Secondary | ICD-10-CM | POA: Insufficient documentation

## 2015-09-13 DIAGNOSIS — J45909 Unspecified asthma, uncomplicated: Secondary | ICD-10-CM | POA: Diagnosis not present

## 2015-09-13 DIAGNOSIS — I10 Essential (primary) hypertension: Secondary | ICD-10-CM | POA: Insufficient documentation

## 2015-09-13 DIAGNOSIS — M7989 Other specified soft tissue disorders: Secondary | ICD-10-CM

## 2015-09-13 DIAGNOSIS — R252 Cramp and spasm: Secondary | ICD-10-CM | POA: Diagnosis not present

## 2015-09-13 DIAGNOSIS — Z79899 Other long term (current) drug therapy: Secondary | ICD-10-CM | POA: Insufficient documentation

## 2015-09-13 DIAGNOSIS — J449 Chronic obstructive pulmonary disease, unspecified: Secondary | ICD-10-CM | POA: Insufficient documentation

## 2015-09-13 DIAGNOSIS — M79605 Pain in left leg: Secondary | ICD-10-CM

## 2015-09-13 HISTORY — DX: Pain in unspecified foot: M79.673

## 2015-09-13 HISTORY — DX: Other chronic pain: G89.29

## 2015-09-13 HISTORY — DX: Dorsalgia, unspecified: M54.9

## 2015-09-13 HISTORY — DX: Acute embolism and thrombosis of unspecified deep veins of unspecified lower extremity: I82.409

## 2015-09-13 HISTORY — DX: Other pulmonary embolism without acute cor pulmonale: I26.99

## 2015-09-13 HISTORY — DX: Chronic obstructive pulmonary disease, unspecified: J44.9

## 2015-09-13 HISTORY — DX: Neuralgia and neuritis, unspecified: M79.2

## 2015-09-13 LAB — COMPREHENSIVE METABOLIC PANEL
ALBUMIN: 4 g/dL (ref 3.5–5.0)
ALT: 33 U/L (ref 17–63)
ANION GAP: 9 (ref 5–15)
AST: 24 U/L (ref 15–41)
Alkaline Phosphatase: 67 U/L (ref 38–126)
BUN: 37 mg/dL — AB (ref 6–20)
CHLORIDE: 103 mmol/L (ref 101–111)
CO2: 24 mmol/L (ref 22–32)
Calcium: 9.8 mg/dL (ref 8.9–10.3)
Creatinine, Ser: 1.5 mg/dL — ABNORMAL HIGH (ref 0.61–1.24)
GFR calc Af Amer: 56 mL/min — ABNORMAL LOW (ref >60)
GFR calc non Af Amer: 48 mL/min — ABNORMAL LOW (ref >60)
GLUCOSE: 95 mg/dL (ref 65–99)
POTASSIUM: 4.4 mmol/L (ref 3.5–5.1)
SODIUM: 136 mmol/L (ref 135–145)
Total Bilirubin: 0.3 mg/dL (ref 0.3–1.2)
Total Protein: 7.6 g/dL (ref 6.5–8.1)

## 2015-09-13 LAB — CBC WITH DIFFERENTIAL/PLATELET
BASOS ABS: 0 10*3/uL (ref 0.0–0.1)
BASOS PCT: 0 %
EOS ABS: 0.3 10*3/uL (ref 0.0–0.7)
Eosinophils Relative: 3 %
HEMATOCRIT: 36.9 % — AB (ref 39.0–52.0)
Hemoglobin: 12.6 g/dL — ABNORMAL LOW (ref 13.0–17.0)
Lymphocytes Relative: 24 %
Lymphs Abs: 2.8 10*3/uL (ref 0.7–4.0)
MCH: 31.3 pg (ref 26.0–34.0)
MCHC: 34.1 g/dL (ref 30.0–36.0)
MCV: 91.6 fL (ref 78.0–100.0)
MONO ABS: 0.7 10*3/uL (ref 0.1–1.0)
Monocytes Relative: 6 %
NEUTROS ABS: 8.1 10*3/uL — AB (ref 1.7–7.7)
Neutrophils Relative %: 67 %
PLATELETS: 232 10*3/uL (ref 150–400)
RBC: 4.03 MIL/uL — ABNORMAL LOW (ref 4.22–5.81)
RDW: 12.9 % (ref 11.5–15.5)
WBC: 11.9 10*3/uL — ABNORMAL HIGH (ref 4.0–10.5)

## 2015-09-13 LAB — PROTIME-INR
INR: 1.47 (ref 0.00–1.49)
PROTHROMBIN TIME: 17.9 s — AB (ref 11.6–15.2)

## 2015-09-13 LAB — LIPASE, BLOOD: Lipase: 37 U/L (ref 11–51)

## 2015-09-13 MED ORDER — SODIUM CHLORIDE 0.9 % IV BOLUS (SEPSIS)
1000.0000 mL | Freq: Once | INTRAVENOUS | Status: AC
  Administered 2015-09-13: 1000 mL via INTRAVENOUS

## 2015-09-13 NOTE — ED Notes (Signed)
Pt states understanding of care given and follow up US.  Ambulated from ED

## 2015-09-13 NOTE — ED Notes (Signed)
Pt ambulated to bathroom on own

## 2015-09-13 NOTE — ED Notes (Signed)
No nausea or vomiting with ginger ale

## 2015-09-13 NOTE — Discharge Instructions (Signed)
Increase your fluid intake (ie:  Gatoraide) for the next few days.  Eat a bland diet and advance to your regular diet slowly as you can tolerate it.   Avoid full strength juices, as well as milk and milk products until your diarrhea has resolved.   Call your regular medical doctor Monday to schedule a follow up appointment in the next 2 days. Return tomorrow morning, at the time and place given to you, for your outpatient ultrasound of your left leg.  Return to the Emergency Department immediately if not improving (or even worsening) despite taking the medicines as prescribed, any black or bloody stool or vomit, if you develop a fever over "101," or for any other concerns.

## 2015-09-13 NOTE — ED Notes (Signed)
Pt provided with ginger ale 

## 2015-09-13 NOTE — ED Notes (Addendum)
Pt has multiple complaints with the main one being left leg pain.  States he had a DVT in that leg last year and he is concerned for another despite taking anticoagulants (pt not sure which one).  Pt also c/o falling 3 days ago after his left leg "gave out."  Pt very anxious at triage and states he has also had a stomach virus with vomiting and diarrhea for the past few days which is resolving.

## 2015-09-13 NOTE — ED Provider Notes (Signed)
CSN: [id number]     Arrival date & time 09/13/15  1745 History   First MD Initiated Contact with Patient 09/13/15 1818     Chief Complaint  Patient presents with  . Leg Pain  . Spasms  . Diarrhea     HPI Pt was seen at [facility]. Per pt, c/o gradual onset and improvement of multiple intermittent episodes of "diarrhea" for the past 3 days. Describes the stools as "watery." States the diarrhea is "slowly getting better" and he only had "2 to 3 stools today." Pt states he has developed "cramps" in his left leg over the past 2 to 3 days. Pt states these muscle cramps "are like when I had a blood clot in my leg" and pt is concerned regarding same today. Pt endorses compliance with Eliquis. Denies abd pain, no N/V, no black or blood in stools, no back pain, no rash, no fevers, no CP/SOB.    Past Medical History  Diagnosis Date  . Hypertension   . Gout   . High cholesterol   . Asthma   . Leukocytosis 02/08/2015  . DVT (deep venous thrombosis) ([place])   . PE (pulmonary embolism)   . Chronic pain   . COPD (chronic obstructive pulmonary disease) ([place])   . Chronic back pain   . Chronic foot pain   . Peripheral neuropathic pain [facility])    Past Surgical History  Procedure Laterality Date  . Right knee arthroscopy    . Hernia repair    . Esophagogastroduodenoscopy  06/12/2012    [id number] hiatal hernia/WHITE PLAQUES MOST LIKELY DUE TO CANDIDA ESOPHAGITIS/Single ulcer at the gastroesophageal junction-MOST LIKELY CAUSE FOR ODYNOPHAGIA/Stricture  at the gastroesophageal junction   Family History  Problem Relation Age of Onset  . Stroke Father   . Hypertension Father   . Cancer Mother    Social History  Substance Use Topics  . Smoking status: Former Smoker -- 2.00 packs/day for 20 years    Types: Cigarettes    Quit date: 05/16/1999  . Smokeless tobacco: None  . Alcohol Use: Yes     Comment: occasional    Review of Systems ROS: Statement: All systems negative except as marked or noted in the  HPI; Constitutional: Negative for fever and chills. ; ; Eyes: Negative for eye pain, redness and discharge. ; ; ENMT: Negative for ear pain, hoarseness, nasal congestion, sinus pressure and sore throat. ; ; Cardiovascular: Negative for chest pain, palpitations, diaphoresis, dyspnea and peripheral edema. ; ; Respiratory: Negative for cough, wheezing and stridor. ; ; Gastrointestinal: +diarrhea. Negative for nausea, vomiting, abdominal pain, blood in stool, hematemesis, jaundice and rectal bleeding. . ; ; Genitourinary: Negative for dysuria, flank pain and hematuria. ; ; Musculoskeletal: +muscle cramps in left leg. Negative for back pain and neck pain. Negative for swelling and trauma.; ; Skin: Negative for pruritus, rash, abrasions, blisters, bruising and skin lesion.; ; Neuro: Negative for headache, lightheadedness and neck stiffness. Negative for weakness, altered level of consciousness , altered mental status, extremity weakness, paresthesias, involuntary movement, seizure and syncope.     Allergies  Review of patient's allergies indicates no known allergies.  Home Medications   Prior to Admission medications   Medication Sig Start Date End Date Taking? Authorizing Provider  acetaminophen (TYLENOL) 500 MG tablet Take 1 tablet (500 mg total) by mouth every 6 (six) hours as needed for mild pain. 02/10/15  Yes [name], MD  albuterol (PROVENTIL HFA;VENTOLIN HFA) 108 (90 BASE) MCG/ACT inhaler Inhale 2 puffs  into the lungs 3 (three) times daily. And every 4 hours as needed for wheezing, chest congestion, or shortness of breath. 05/17/12  Yes [name], MD  albuterol (PROVENTIL) (5 MG/ML) 0.5% nebulizer solution Take 1 mL (5 mg total) by nebulization every 4 (four) hours as needed. For shortness of breath/wheezing 06/14/12  Yes [name], MD  allopurinol (ZYLOPRIM) 300 MG tablet Take 300 mg by mouth 2 (two) times daily.    Yes Historical Provider, MD  APPLE CIDER VINEGAR PO Take 10 mLs  by mouth daily.   Yes Historical Provider, MD  BREO ELLIPTA 100-25 MCG/INH AEPB Inhale 1 puff into the lungs daily. 01/30/15  Yes Historical Provider, MD  Cyanocobalamin (B-12 PO) Take 1 tablet by mouth daily.   Yes Historical Provider, MD  gabapentin (NEURONTIN) 300 MG capsule Take 300 mg by mouth 3 (three) times daily.  12/26/14  Yes Historical Provider, MD  GARLIC PO Take 1 tablet by mouth daily.   Yes Historical Provider, MD  ipratropium (ATROVENT) 0.02 % nebulizer solution Take 2.5 mLs (500 mcg total) by nebulization 4 (four) times daily. 07/04/12  Yes [name], PA-C  Omega-3 Fatty Acids (FISH OIL) 1000 MG CAPS Take 1 capsule by mouth daily.   Yes Historical Provider, MD  simvastatin (ZOCOR) 40 MG tablet Take 40 mg by mouth every evening.   Yes Historical Provider, MD  tiZANidine (ZANAFLEX) 4 MG tablet Take 4 mg by mouth 3 (three) times daily as needed. For muscle spasms 08/19/15  Yes Historical Provider, MD  valsartan-hydrochlorothiazide (DIOVAN-HCT) 320-25 MG tablet Take 1 tablet by mouth daily. 09/01/15  Yes Historical Provider, MD  XARELTO 20 MG TABS tablet Take 20 mg by mouth every morning.  09/01/15  Yes Historical Provider, MD  apixaban (ELIQUIS) 5 MG TABS tablet Take 10 mg PO BID x 7days, then 5 mg PO BID thereafter. Patient taking differently: Take 5 mg by mouth 2 (two) times daily.  02/08/15   [name], MD   BP 106/70 mmHg  Pulse 86  Temp(Src) 98 F (36.7 C) (Oral)  Resp 16  Ht 6\' 2"  (1.88 m)  Wt 230 lb (104.327 kg)  BMI 29.52 kg/m2  SpO2 98% Physical Exam  1845: Physical examination:  Nursing notes reviewed; Vital signs and O2 SAT reviewed;  Constitutional: Well developed, Well nourished, Well hydrated, In no acute distress; Head:  Normocephalic, atraumatic; Eyes: EOMI, PERRL, No scleral icterus; ENMT: Mouth and pharynx normal, Mucous membranes moist; Neck: Supple, Full range of motion, No lymphadenopathy; Cardiovascular: Regular rate and rhythm, No gallop; Respiratory:  Breath sounds clear & equal bilaterally, No wheezes.  Speaking full sentences with ease, Normal respiratory effort/excursion; Chest: Nontender, Movement normal; Abdomen: Soft, Nontender, Nondistended, Normal bowel sounds; Genitourinary: No CVA tenderness; Extremities: Pulses normal, No tenderness, No deformity. +FROM left knee, including able to lift extended LLE off stretcher, and extend affected lower leg against resistance.  No ligamentous laxity.  No patellar or quad tendon step-offs.  NMS intact left foot, strong pedal pp. +plantarflexion of left foot w/calf squeeze.  No palpable gap left Achilles's tendon.  No proximal fibular head tenderness.  No edema, erythema, warmth, ecchymosis or deformity. NT left hip/knee/ankle/foot. Muscles compartments soft. No edema, No calf tenderness, edema or asymmetry.; Neuro: AA&Ox3, rambling vague historian. Major CN grossly intact.  Speech clear. No gross focal motor or sensory deficits in extremities. Climbs on and off stretcher easily by himself. Gait steady.; Skin: Color normal, Warm, Dry.   ED Course  Procedures (including  critical care time) Labs Review   Imaging Review  I have personally reviewed and evaluated these images and lab results as part of my medical decision-making.   EKG Interpretation None      MDM  MDM Reviewed: previous chart, nursing note and vitals Reviewed previous: labs Interpretation: labs and x-ray      Results for orders placed or performed during the hospital encounter of 09/13/15  CBC with Differential  Result Value Ref Range   WBC 11.9 (H) 4.0 - 10.5 K/uL   RBC 4.03 (L) 4.22 - 5.81 MIL/uL   Hemoglobin 12.6 (L) 13.0 - 17.0 g/dL   HCT 36.9 (L) 39.0 - 52.0 %   MCV 91.6 78.0 - 100.0 fL   MCH 31.3 26.0 - 34.0 pg   MCHC 34.1 30.0 - 36.0 g/dL   RDW 12.9 11.5 - 15.5 %   Platelets 232 150 - 400 K/uL   Neutrophils Relative % 67 %   Neutro Abs 8.1 (H) 1.7 - 7.7 K/uL   Lymphocytes Relative 24 %   Lymphs Abs 2.8 0.7 -  4.0 K/uL   Monocytes Relative 6 %   Monocytes Absolute 0.7 0.1 - 1.0 K/uL   Eosinophils Relative 3 %   Eosinophils Absolute 0.3 0.0 - 0.7 K/uL   Basophils Relative 0 %   Basophils Absolute 0.0 0.0 - 0.1 K/uL  Comprehensive metabolic panel  Result Value Ref Range   Sodium 136 135 - 145 mmol/L   Potassium 4.4 3.5 - 5.1 mmol/L   Chloride 103 101 - 111 mmol/L   CO2 24 22 - 32 mmol/L   Glucose, Bld 95 65 - 99 mg/dL   BUN 37 (H) 6 - 20 mg/dL   Creatinine, Ser 1.50 (H) 0.61 - 1.24 mg/dL   Calcium 9.8 8.9 - 10.3 mg/dL   Total Protein 7.6 6.5 - 8.1 g/dL   Albumin 4.0 3.5 - 5.0 g/dL   AST 24 15 - 41 U/L   ALT 33 17 - 63 U/L   Alkaline Phosphatase 67 38 - 126 U/L   Total Bilirubin 0.3 0.3 - 1.2 mg/dL   GFR calc non Af Amer 48 (L) >60 mL/min   GFR calc Af Amer 56 (L) >60 mL/min   Anion gap 9 5 - 15  Protime-INR  Result Value Ref Range   Prothrombin Time 17.9 (H) 11.6 - 15.2 seconds   INR 1.47 0.00 - 1.49  Lipase, blood  Result Value Ref Range   Lipase 37 11 - 51 U/L   Dg Abd Acute W/chest 09/13/2015  CLINICAL DATA:  DIARRHEA, PATIENT STATES " HE FELL THREE DAYS AGO AND HIT HIS LEFT LEG, HISTORY OF DVT, BUT HAS BEEN HAVING DIARRHEA FOR SEVERAL DAYS " HISTORY OF ASTHMA, HTN, PE, DVT, CHRONIC PAIN, COPD EXAM: DG ABDOMEN ACUTE W/ 1V CHEST COMPARISON:  02/07/2015 FINDINGS: Normal cardiac silhouette. Lungs are clear the fine interstitial pattern. No free air beneath hemidiaphragm. No dilated to large or small bowel. Gas in the rectum. Degenerate spurring of the spine. IMPRESSION: 1. No acute cardiopulmonary findings. 2. Normal bowel-gas pattern. Electronically Signed   By: Suzy Bouchard M.D.   On: 09/13/2015 19:15    2045:  Pt very vague, rambling historian. IVF given for mildly elevated BUN/Cr (likely elevated from hx diarrhea). H/H per baseline. VS per baseline. Pt has tol PO well while in the ED without N/V.  No stooling while in the ED.  Pt has ambulated with steady gait, easy resps, NAD.  Abd  remains benign, VSS. Feels better and wants to go home now. Will order outpt Vasc US LLE per pt request/concern for DVT. Dx and testing d/w pt.  Questions answered.  Verb understanding, agreeable to d/c home with outpt f/u.     Francine Graven, DO 09/17/15 1445

## 2015-09-14 ENCOUNTER — Inpatient Hospital Stay (HOSPITAL_COMMUNITY): Admit: 2015-09-14 | Payer: Medicare (Managed Care)

## 2015-09-16 ENCOUNTER — Ambulatory Visit (HOSPITAL_COMMUNITY): Admission: RE | Admit: 2015-09-16 | Payer: Medicare (Managed Care) | Source: Ambulatory Visit

## 2017-06-22 DIAGNOSIS — R0902 Hypoxemia: Secondary | ICD-10-CM | POA: Diagnosis not present

## 2017-06-22 DIAGNOSIS — J449 Chronic obstructive pulmonary disease, unspecified: Secondary | ICD-10-CM | POA: Diagnosis not present

## 2017-06-22 DIAGNOSIS — I10 Essential (primary) hypertension: Secondary | ICD-10-CM | POA: Diagnosis not present

## 2017-06-22 DIAGNOSIS — G894 Chronic pain syndrome: Secondary | ICD-10-CM | POA: Diagnosis not present

## 2017-07-07 DIAGNOSIS — J449 Chronic obstructive pulmonary disease, unspecified: Secondary | ICD-10-CM | POA: Diagnosis not present

## 2017-07-07 DIAGNOSIS — G6289 Other specified polyneuropathies: Secondary | ICD-10-CM | POA: Diagnosis not present

## 2017-07-07 DIAGNOSIS — I10 Essential (primary) hypertension: Secondary | ICD-10-CM | POA: Diagnosis not present

## 2017-07-07 DIAGNOSIS — E782 Mixed hyperlipidemia: Secondary | ICD-10-CM | POA: Diagnosis not present

## 2017-07-07 DIAGNOSIS — M1A09X0 Idiopathic chronic gout, multiple sites, without tophus (tophi): Secondary | ICD-10-CM | POA: Diagnosis not present

## 2017-07-21 DIAGNOSIS — R0902 Hypoxemia: Secondary | ICD-10-CM | POA: Diagnosis not present

## 2017-08-15 DIAGNOSIS — G2581 Restless legs syndrome: Secondary | ICD-10-CM | POA: Diagnosis not present

## 2017-08-15 DIAGNOSIS — G629 Polyneuropathy, unspecified: Secondary | ICD-10-CM | POA: Diagnosis not present

## 2017-08-15 DIAGNOSIS — I10 Essential (primary) hypertension: Secondary | ICD-10-CM | POA: Diagnosis not present

## 2017-08-20 DIAGNOSIS — R0902 Hypoxemia: Secondary | ICD-10-CM | POA: Diagnosis not present

## 2017-09-08 DIAGNOSIS — M479 Spondylosis, unspecified: Secondary | ICD-10-CM | POA: Diagnosis not present

## 2017-09-08 DIAGNOSIS — G5603 Carpal tunnel syndrome, bilateral upper limbs: Secondary | ICD-10-CM | POA: Diagnosis not present

## 2017-09-08 DIAGNOSIS — I10 Essential (primary) hypertension: Secondary | ICD-10-CM | POA: Diagnosis not present

## 2017-09-08 DIAGNOSIS — G629 Polyneuropathy, unspecified: Secondary | ICD-10-CM | POA: Diagnosis not present

## 2017-09-20 DIAGNOSIS — R0902 Hypoxemia: Secondary | ICD-10-CM | POA: Diagnosis not present

## 2017-10-06 DIAGNOSIS — G6289 Other specified polyneuropathies: Secondary | ICD-10-CM | POA: Diagnosis not present

## 2017-10-06 DIAGNOSIS — M1A09X0 Idiopathic chronic gout, multiple sites, without tophus (tophi): Secondary | ICD-10-CM | POA: Diagnosis not present

## 2017-10-06 DIAGNOSIS — Z86711 Personal history of pulmonary embolism: Secondary | ICD-10-CM | POA: Diagnosis not present

## 2017-10-06 DIAGNOSIS — E782 Mixed hyperlipidemia: Secondary | ICD-10-CM | POA: Diagnosis not present

## 2017-10-06 DIAGNOSIS — J449 Chronic obstructive pulmonary disease, unspecified: Secondary | ICD-10-CM | POA: Diagnosis not present

## 2017-10-06 DIAGNOSIS — I10 Essential (primary) hypertension: Secondary | ICD-10-CM | POA: Diagnosis not present

## 2017-10-11 DIAGNOSIS — G629 Polyneuropathy, unspecified: Secondary | ICD-10-CM | POA: Diagnosis not present

## 2017-10-11 DIAGNOSIS — E559 Vitamin D deficiency, unspecified: Secondary | ICD-10-CM | POA: Diagnosis not present

## 2017-10-11 DIAGNOSIS — D519 Vitamin B12 deficiency anemia, unspecified: Secondary | ICD-10-CM | POA: Diagnosis not present

## 2017-10-20 DIAGNOSIS — R0902 Hypoxemia: Secondary | ICD-10-CM | POA: Diagnosis not present

## 2017-11-03 DIAGNOSIS — G629 Polyneuropathy, unspecified: Secondary | ICD-10-CM | POA: Diagnosis not present

## 2017-11-03 DIAGNOSIS — M5416 Radiculopathy, lumbar region: Secondary | ICD-10-CM | POA: Diagnosis not present

## 2017-11-03 DIAGNOSIS — E559 Vitamin D deficiency, unspecified: Secondary | ICD-10-CM | POA: Diagnosis not present

## 2017-11-03 DIAGNOSIS — G8929 Other chronic pain: Secondary | ICD-10-CM | POA: Diagnosis not present

## 2017-11-20 DIAGNOSIS — R0902 Hypoxemia: Secondary | ICD-10-CM | POA: Diagnosis not present

## 2017-11-30 DIAGNOSIS — R748 Abnormal levels of other serum enzymes: Secondary | ICD-10-CM | POA: Diagnosis not present

## 2017-11-30 DIAGNOSIS — R945 Abnormal results of liver function studies: Secondary | ICD-10-CM | POA: Diagnosis not present

## 2017-11-30 DIAGNOSIS — D72829 Elevated white blood cell count, unspecified: Secondary | ICD-10-CM | POA: Diagnosis not present

## 2017-11-30 DIAGNOSIS — R899 Unspecified abnormal finding in specimens from other organs, systems and tissues: Secondary | ICD-10-CM | POA: Diagnosis not present

## 2017-12-20 DIAGNOSIS — R0902 Hypoxemia: Secondary | ICD-10-CM | POA: Diagnosis not present

## 2018-01-16 DIAGNOSIS — E782 Mixed hyperlipidemia: Secondary | ICD-10-CM | POA: Diagnosis not present

## 2018-01-16 DIAGNOSIS — J449 Chronic obstructive pulmonary disease, unspecified: Secondary | ICD-10-CM | POA: Diagnosis not present

## 2018-01-16 DIAGNOSIS — M1A09X0 Idiopathic chronic gout, multiple sites, without tophus (tophi): Secondary | ICD-10-CM | POA: Diagnosis not present

## 2018-01-16 DIAGNOSIS — I10 Essential (primary) hypertension: Secondary | ICD-10-CM | POA: Diagnosis not present

## 2018-01-16 DIAGNOSIS — G6289 Other specified polyneuropathies: Secondary | ICD-10-CM | POA: Diagnosis not present

## 2018-01-20 DIAGNOSIS — R0902 Hypoxemia: Secondary | ICD-10-CM | POA: Diagnosis not present

## 2018-02-20 DIAGNOSIS — R0902 Hypoxemia: Secondary | ICD-10-CM | POA: Diagnosis not present

## 2018-03-08 DIAGNOSIS — G8929 Other chronic pain: Secondary | ICD-10-CM | POA: Diagnosis not present

## 2018-03-08 DIAGNOSIS — M5416 Radiculopathy, lumbar region: Secondary | ICD-10-CM | POA: Diagnosis not present

## 2018-03-08 DIAGNOSIS — G5603 Carpal tunnel syndrome, bilateral upper limbs: Secondary | ICD-10-CM | POA: Diagnosis not present

## 2018-03-08 DIAGNOSIS — E559 Vitamin D deficiency, unspecified: Secondary | ICD-10-CM | POA: Diagnosis not present

## 2018-03-22 DIAGNOSIS — R0902 Hypoxemia: Secondary | ICD-10-CM | POA: Diagnosis not present

## 2018-04-18 ENCOUNTER — Other Ambulatory Visit: Payer: Self-pay

## 2018-04-18 NOTE — Patient Outreach (Signed)
[facility]) Care Management  04/18/2018  CUINN WESTERHOLD 1955/12/11 [id number]   Medication Adherence call to Mr. Tahj Njoku spoke with patient he has stop taking Simvastatin 40 mg because he is having side effects patient already spoke with his doctor. Mr. Hartis is showing past due under [place].   [facility] Management Direct Dial [phone]  Fax [phone] [email or web address]

## 2018-04-22 DIAGNOSIS — R0902 Hypoxemia: Secondary | ICD-10-CM | POA: Diagnosis not present

## 2018-05-22 DIAGNOSIS — R0902 Hypoxemia: Secondary | ICD-10-CM | POA: Diagnosis not present

## 2018-06-22 DIAGNOSIS — R0902 Hypoxemia: Secondary | ICD-10-CM | POA: Diagnosis not present

## 2018-07-22 DIAGNOSIS — R0902 Hypoxemia: Secondary | ICD-10-CM | POA: Diagnosis not present

## 2018-08-21 DIAGNOSIS — R0902 Hypoxemia: Secondary | ICD-10-CM | POA: Diagnosis not present

## 2018-09-21 DIAGNOSIS — R0902 Hypoxemia: Secondary | ICD-10-CM | POA: Diagnosis not present

## 2018-10-21 DIAGNOSIS — R0902 Hypoxemia: Secondary | ICD-10-CM | POA: Diagnosis not present

## 2018-11-16 DIAGNOSIS — I10 Essential (primary) hypertension: Secondary | ICD-10-CM | POA: Diagnosis not present

## 2018-11-16 DIAGNOSIS — J449 Chronic obstructive pulmonary disease, unspecified: Secondary | ICD-10-CM | POA: Diagnosis not present

## 2018-11-16 DIAGNOSIS — E782 Mixed hyperlipidemia: Secondary | ICD-10-CM | POA: Diagnosis not present

## 2018-11-16 DIAGNOSIS — G6289 Other specified polyneuropathies: Secondary | ICD-10-CM | POA: Diagnosis not present

## 2018-11-16 DIAGNOSIS — M1A09X0 Idiopathic chronic gout, multiple sites, without tophus (tophi): Secondary | ICD-10-CM | POA: Diagnosis not present

## 2018-11-21 DIAGNOSIS — R0902 Hypoxemia: Secondary | ICD-10-CM | POA: Diagnosis not present

## 2018-12-21 DIAGNOSIS — R0902 Hypoxemia: Secondary | ICD-10-CM | POA: Diagnosis not present

## 2019-01-01 DIAGNOSIS — G8929 Other chronic pain: Secondary | ICD-10-CM | POA: Diagnosis not present

## 2019-01-01 DIAGNOSIS — G5603 Carpal tunnel syndrome, bilateral upper limbs: Secondary | ICD-10-CM | POA: Diagnosis not present

## 2019-01-01 DIAGNOSIS — M5416 Radiculopathy, lumbar region: Secondary | ICD-10-CM | POA: Diagnosis not present

## 2019-01-01 DIAGNOSIS — G629 Polyneuropathy, unspecified: Secondary | ICD-10-CM | POA: Diagnosis not present

## 2019-01-15 DIAGNOSIS — J449 Chronic obstructive pulmonary disease, unspecified: Secondary | ICD-10-CM | POA: Diagnosis not present

## 2019-01-15 DIAGNOSIS — E782 Mixed hyperlipidemia: Secondary | ICD-10-CM | POA: Diagnosis not present

## 2019-01-15 DIAGNOSIS — I10 Essential (primary) hypertension: Secondary | ICD-10-CM | POA: Diagnosis not present

## 2019-01-15 DIAGNOSIS — M1A09X0 Idiopathic chronic gout, multiple sites, without tophus (tophi): Secondary | ICD-10-CM | POA: Diagnosis not present

## 2019-01-21 DIAGNOSIS — R0902 Hypoxemia: Secondary | ICD-10-CM | POA: Diagnosis not present

## 2019-02-19 DIAGNOSIS — M1A09X0 Idiopathic chronic gout, multiple sites, without tophus (tophi): Secondary | ICD-10-CM | POA: Diagnosis not present

## 2019-02-19 DIAGNOSIS — I10 Essential (primary) hypertension: Secondary | ICD-10-CM | POA: Diagnosis not present

## 2019-02-19 DIAGNOSIS — J449 Chronic obstructive pulmonary disease, unspecified: Secondary | ICD-10-CM | POA: Diagnosis not present

## 2019-02-19 DIAGNOSIS — G6289 Other specified polyneuropathies: Secondary | ICD-10-CM | POA: Diagnosis not present

## 2019-02-19 DIAGNOSIS — E782 Mixed hyperlipidemia: Secondary | ICD-10-CM | POA: Diagnosis not present

## 2019-02-20 DIAGNOSIS — G629 Polyneuropathy, unspecified: Secondary | ICD-10-CM | POA: Diagnosis not present

## 2019-02-20 DIAGNOSIS — G2581 Restless legs syndrome: Secondary | ICD-10-CM | POA: Diagnosis not present

## 2019-02-20 DIAGNOSIS — G8929 Other chronic pain: Secondary | ICD-10-CM | POA: Diagnosis not present

## 2019-02-21 DIAGNOSIS — R0902 Hypoxemia: Secondary | ICD-10-CM | POA: Diagnosis not present

## 2019-03-23 DIAGNOSIS — R0902 Hypoxemia: Secondary | ICD-10-CM | POA: Diagnosis not present

## 2019-04-23 ENCOUNTER — Encounter (HOSPITAL_COMMUNITY): Payer: Self-pay | Admitting: Emergency Medicine

## 2019-04-23 ENCOUNTER — Other Ambulatory Visit: Payer: Self-pay

## 2019-04-23 ENCOUNTER — Emergency Department (HOSPITAL_COMMUNITY)
Admission: EM | Admit: 2019-04-23 | Discharge: 2019-04-23 | Disposition: A | Discharge status: Home / Self Care | Payer: Medicare Other | Attending: Emergency Medicine | Admitting: Emergency Medicine

## 2019-04-23 DIAGNOSIS — B029 Zoster without complications: Secondary | ICD-10-CM | POA: Diagnosis not present

## 2019-04-23 DIAGNOSIS — R0902 Hypoxemia: Secondary | ICD-10-CM | POA: Diagnosis not present

## 2019-04-23 DIAGNOSIS — Z87891 Personal history of nicotine dependence: Secondary | ICD-10-CM | POA: Insufficient documentation

## 2019-04-23 DIAGNOSIS — I10 Essential (primary) hypertension: Secondary | ICD-10-CM | POA: Insufficient documentation

## 2019-04-23 DIAGNOSIS — Z7901 Long term (current) use of anticoagulants: Secondary | ICD-10-CM | POA: Diagnosis not present

## 2019-04-23 DIAGNOSIS — J449 Chronic obstructive pulmonary disease, unspecified: Secondary | ICD-10-CM | POA: Diagnosis not present

## 2019-04-23 DIAGNOSIS — R21 Rash and other nonspecific skin eruption: Secondary | ICD-10-CM | POA: Diagnosis present

## 2019-04-23 MED ORDER — ACYCLOVIR 800 MG PO TABS: 800.0000 mg | ORAL_TABLET | Freq: Every day | ORAL | 0 refills | Status: AC

## 2019-04-23 MED ORDER — OXYCODONE-ACETAMINOPHEN 5-325 MG PO TABS: 2.0000 | ORAL_TABLET | Freq: Four times a day (QID) | ORAL | 0 refills | Status: DC | PRN

## 2019-04-23 MED ORDER — VALACYCLOVIR HCL 1 G PO TABS: 1000.0000 mg | ORAL_TABLET | Freq: Three times a day (TID) | ORAL | 0 refills | Status: DC

## 2019-04-23 NOTE — ED Triage Notes (Signed)
Pt reports burning rash to left shoulder. Consistent with shingles.

## 2019-04-23 NOTE — ED Provider Notes (Signed)
[facility] EMERGENCY DEPARTMENT Provider Note   CSN: [id number] Arrival date & time: 04/23/19  1353     History   Chief Complaint Chief Complaint  Patient presents with  . Rash    HPI Antonio Baker is a 63 y.o. male who  has a past medical history of Asthma, Chronic back pain, Chronic foot pain, Chronic pain, COPD (chronic obstructive pulmonary disease) ([place]), DVT (deep venous thrombosis) ([place]), Gout, High cholesterol, Hypertension, Leukocytosis (02/08/2015), PE (pulmonary embolism), and Peripheral neuropathic pain. He had onset of stinging pain 1 week ago on the left upper back along with a burning sensation. He has had chills, fatigue, unsure of hx of fever. His friend told him he has a rash and have placed perroxide on the area. He has had progressive worsning of pain. He has associated difficulty sleeping. He has tried nothing for the pain . Pain is constant. Nothing seems to make it worse or better.       Rash Associated symptoms: no abdominal pain and no fever     Past Medical History:  Diagnosis Date  . Asthma   . Chronic back pain   . Chronic foot pain   . Chronic pain   . COPD (chronic obstructive pulmonary disease) ([place])   . DVT (deep venous thrombosis) ([place])   . Gout   . High cholesterol   . Hypertension   . Leukocytosis 02/08/2015  . PE (pulmonary embolism)   . Peripheral neuropathic pain     Patient Active Problem List   Diagnosis Date Noted  . Hypercalcemia 02/09/2015  . Left leg DVT ([place]) 02/08/2015  . Acute pulmonary embolism ([place]) 02/08/2015  . Dehydration 02/08/2015  . Leukocytosis 02/08/2015  . COPD (chronic obstructive pulmonary disease) ([place]) 02/08/2015  . AKI (acute kidney injury) ([place]) 02/07/2015  . Tobacco dependence   . Acute respiratory failure with hypoxia ([place]) 01/25/2015  . High cholesterol   . HCAP (healthcare-associated pneumonia) 06/11/2012  . COPD exacerbation ([place]) 06/10/2012  . GERD (gastroesophageal reflux disease) 06/10/2012   . Hyperglycemia, drug-induced 05/16/2012  . Tachycardia 05/16/2012  . Asthma with exacerbation 05/15/2012  . CAP (community acquired pneumonia) 05/15/2012  . Hypertension 05/15/2012    Past Surgical History:  Procedure Laterality Date  . ESOPHAGOGASTRODUODENOSCOPY  06/12/2012   [id number] hiatal hernia/WHITE PLAQUES MOST LIKELY DUE TO CANDIDA ESOPHAGITIS/Single ulcer at the gastroesophageal junction-MOST LIKELY CAUSE FOR ODYNOPHAGIA/Stricture  at the gastroesophageal junction  . HERNIA REPAIR    . Right knee arthroscopy          Home Medications    Prior to Admission medications   Medication Sig Start Date End Date Taking? Authorizing Provider  acetaminophen (TYLENOL) 500 MG tablet Take 1 tablet (500 mg total) by mouth every 6 (six) hours as needed for mild pain. 02/10/15   [name], MD  albuterol (PROVENTIL HFA;VENTOLIN HFA) 108 (90 BASE) MCG/ACT inhaler Inhale 2 puffs into the lungs 3 (three) times daily. And every 4 hours as needed for wheezing, chest congestion, or shortness of breath. 05/17/12   [name], MD  albuterol (PROVENTIL) (5 MG/ML) 0.5% nebulizer solution Take 1 mL (5 mg total) by nebulization every 4 (four) hours as needed. For shortness of breath/wheezing 06/14/12   [name], MD  allopurinol (ZYLOPRIM) 300 MG tablet Take 300 mg by mouth 2 (two) times daily.     [provider]  apixaban (ELIQUIS) 5 MG TABS tablet Take 10 mg PO BID x 7days, then 5 mg PO BID thereafter.  Patient taking differently: Take 5 mg by mouth 2 (two) times daily.  02/08/15   [name], MD  APPLE CIDER VINEGAR PO Take 10 mLs by mouth daily.    [provider]  BREO ELLIPTA 100-25 MCG/INH AEPB Inhale 1 puff into the lungs daily. 01/30/15   [provider]  Cyanocobalamin (B-12 PO) Take 1 tablet by mouth daily.    [provider]  gabapentin (NEURONTIN) 300 MG capsule Take 300 mg by mouth 3 (three) times daily.  12/26/14   [provider]  GARLIC PO Take 1 tablet by mouth daily.    [provider]  ipratropium (ATROVENT) 0.02 % nebulizer solution Take 2.5 mLs (500 mcg total) by nebulization 4 (four) times daily. 07/04/12   [name], PA-C  Omega-3 Fatty Acids (FISH OIL) 1000 MG CAPS Take 1 capsule by mouth daily.    [provider]  simvastatin (ZOCOR) 40 MG tablet Take 40 mg by mouth every evening.    [provider]  tiZANidine (ZANAFLEX) 4 MG tablet Take 4 mg by mouth 3 (three) times daily as needed. For muscle spasms 08/19/15   [provider]  valsartan-hydrochlorothiazide (DIOVAN-HCT) 320-25 MG tablet Take 1 tablet by mouth daily. 09/01/15   [provider]  XARELTO 20 MG TABS tablet Take 20 mg by mouth every morning.  09/01/15   [provider]    Family History Family History  Problem Relation Age of Onset  . Stroke Father   . Hypertension Father   . Cancer Mother     Social History Social History   Tobacco Use  . Smoking status: Former Smoker    Packs/day: 2.00    Years: 20.00    Pack years: 40.00    Types: Cigarettes    Quit date: 05/16/1999    Years since quitting: 19.9  Substance Use Topics  . Alcohol use: Yes    Comment: occasional  . Drug use: No     Allergies   Patient has no known allergies.   Review of Systems Review of Systems  Constitutional: Positive for chills. Negative for fever.  HENT: Negative.   Eyes: Negative.   Respiratory: Negative.  Negative for cough.   Cardiovascular: Negative.   Gastrointestinal: Negative.  Negative for abdominal pain.  Skin: Positive for rash. Negative for wound.  Neurological: Negative for weakness and numbness.     Physical Exam Updated Vital Signs BP (!) 147/90 (BP Location: Right Arm)   Pulse (!) 110   Temp 98 F (36.7 C) (Oral)   Resp 18   Ht 6' (1.829 m)   Wt 102.1 kg   SpO2 100%   BMI 30.52 kg/m   Physical Exam Vitals signs and nursing note reviewed.   Constitutional:      General: He is not in acute distress.    Appearance: He is well-developed. He is not diaphoretic.  HENT:     Head: Normocephalic and atraumatic.  Eyes:     General: No scleral icterus.    Conjunctiva/sclera: Conjunctivae normal.  Neck:     Musculoskeletal: Normal range of motion and neck supple.  Cardiovascular:     Rate and Rhythm: Normal rate and regular rhythm.     Heart sounds: Normal heart sounds.  Pulmonary:     Effort: Pulmonary effort is normal. No respiratory distress.     Breath sounds: Normal breath sounds.  Abdominal:     Palpations: Abdomen is soft.     Tenderness: There is  no abdominal tenderness.  Skin:    General: Skin is warm and dry.       Neurological:     Mental Status: He is alert.  Psychiatric:        Behavior: Behavior normal.      ED Treatments / Results  Labs (all labs ordered are listed, but only abnormal results are displayed) Labs Reviewed - No data to display  EKG None  Radiology No results found.  Procedures Procedures (including critical care time)  Medications Ordered in ED Medications - No data to display   Initial Impression / Assessment and Plan / ED Course  I have reviewed the triage vital signs and the nursing notes.  Pertinent labs & imaging results that were available during my care of the patient were reviewed by me and considered in my medical decision making (see chart for details).        Patient with herpes zoster infection of the left upper back.  Patient will be started on acyclovir.  Pain medications.  Is unable to review the PDMP after multiple attempts however given the patient's severe pain and very obvious zoster infection I feel that is appropriate to discharge home with pain medication.  I discussed return precautions.  He has no signs of secondary infection.  Discussed isolation precautions.  Appears appropriate for discharge at this time  Final Clinical Impressions(s) / ED  Diagnoses   Final diagnoses:  None    ED Discharge Orders    None       Margarita Mail, PA-C 04/23/19 2218    Fredia Sorrow, MD 04/25/19 [phone]

## 2019-04-23 NOTE — Discharge Instructions (Signed)
Get help right away if:  The rash is on your face or nose.  You have facial pain, pain around your eye area, or loss of feeling on one side of your face.  You have difficulty seeing.  You have ear pain or have ringing in your ear.  You have a loss of taste.  Your condition gets worse.

## 2019-05-23 DIAGNOSIS — R0902 Hypoxemia: Secondary | ICD-10-CM | POA: Diagnosis not present

## 2019-06-23 DIAGNOSIS — R0902 Hypoxemia: Secondary | ICD-10-CM | POA: Diagnosis not present

## 2019-07-10 DIAGNOSIS — M1A09X0 Idiopathic chronic gout, multiple sites, without tophus (tophi): Secondary | ICD-10-CM | POA: Diagnosis not present

## 2019-07-10 DIAGNOSIS — E782 Mixed hyperlipidemia: Secondary | ICD-10-CM | POA: Diagnosis not present

## 2019-07-10 DIAGNOSIS — J449 Chronic obstructive pulmonary disease, unspecified: Secondary | ICD-10-CM | POA: Diagnosis not present

## 2019-07-10 DIAGNOSIS — Z86711 Personal history of pulmonary embolism: Secondary | ICD-10-CM | POA: Diagnosis not present

## 2019-07-10 DIAGNOSIS — G6289 Other specified polyneuropathies: Secondary | ICD-10-CM | POA: Diagnosis not present

## 2019-07-10 DIAGNOSIS — I10 Essential (primary) hypertension: Secondary | ICD-10-CM | POA: Diagnosis not present

## 2019-07-22 DIAGNOSIS — R0902 Hypoxemia: Secondary | ICD-10-CM | POA: Diagnosis not present

## 2019-07-25 ENCOUNTER — Encounter: Payer: Self-pay | Admitting: Cardiology

## 2019-07-25 NOTE — Progress Notes (Signed)
Cardiology Office Note  Date: 07/26/2019   ID: DAZ FABIEN, DOB February 22, 1956, MRN [id number]  PCP:  Elyn Aquas  Consulting Cardiologist: Satira Sark, MD Electrophysiologist:  None   Chief Complaint  Patient presents with  . Referral for differential arm blood pressures    History of Present Illness: GARO TAMBLYN is a 64 y.o. male referred for cardiology consultation by Ms. [name] NP at [facility] for evaluation of incidentally noted differential blood pressures in the upper extremities. He presents today without chest or arm discomfort with exertion or movement of his upper extremities, no sudden lightheadedness or syncope with vigorous use of his arms.  He does report a more longstanding problem with neuropathy affecting his hands and feet, uncertain etiology at this point.  He states that he has been evaluated by neurologist in [place].  He has a history of pulmonary emboli diagnosed by chest CTA in 2016.  Moderate coronary artery calcification was described.  Aorta was normal in caliber with no dissection and there were otherwise no masses or pathologically enlarged lymph nodes.  He has been on Xarelto with apparently a long-term plan for anticoagulation.  He tells that he has been walking about a mile each day for exercise, also trying to keep his legs strong.  He has no unusual degree of shortness of breath with this activity.  He is no longer on statin therapy, previously on Zocor but stopped this with concerns that it might have been related to his neuropathy.  He takes omega-3 supplements and has his lipids followed by PCP.  He is on Diovan HCT for treatment of his hypertension.  Past Medical History:  Diagnosis Date  . Asthma   . Chronic pain   . COPD (chronic obstructive pulmonary disease) ([place])   . Degenerative disc disease, lumbar   . Essential hypertension   . Gout   . History of DVT (deep vein thrombosis)   . History of pulmonary embolus  (PE)   . Hyperlipidemia   . Peripheral neuropathic pain     Past Surgical History:  Procedure Laterality Date  . ESOPHAGOGASTRODUODENOSCOPY  06/12/2012   [id number] hiatal hernia/[place] MOST LIKELY DUE TO CANDIDA ESOPHAGITIS/Single ulcer at the gastroesophageal junction-MOST LIKELY CAUSE FOR ODYNOPHAGIA/Stricture  at the gastroesophageal junction  . HERNIA REPAIR    . Right knee arthroscopy      Current Outpatient Medications  Medication Sig Dispense Refill  . acetaminophen (TYLENOL) 500 MG tablet Take 1 tablet (500 mg total) by mouth every 6 (six) hours as needed for mild pain. 30 tablet 0  . albuterol (PROVENTIL HFA;VENTOLIN HFA) 108 (90 BASE) MCG/ACT inhaler Inhale 2 puffs into the lungs 3 (three) times daily. And every 4 hours as needed for wheezing, chest congestion, or shortness of breath. 1 Inhaler 0  . albuterol (PROVENTIL) (5 MG/ML) 0.5% nebulizer solution Take 1 mL (5 mg total) by nebulization every 4 (four) hours as needed. For shortness of breath/wheezing 20 mL 0  . allopurinol (ZYLOPRIM) 300 MG tablet Take 300 mg by mouth 2 (two) times daily.     . APPLE CIDER VINEGAR PO Take 10 mLs by mouth daily.    [name] BREO ELLIPTA 100-25 MCG/INH AEPB Inhale 1 puff into the lungs daily.    . Cyanocobalamin (B-12 PO) Take 1 tablet by mouth daily.    [name] gabapentin (NEURONTIN) 300 MG capsule Take 300 mg by mouth 3 (three) times daily.     [name] GARLIC PO Take  1 tablet by mouth daily.    [name] ipratropium (ATROVENT) 0.02 % nebulizer solution Take 2.5 mLs (500 mcg total) by nebulization 4 (four) times daily. 75 mL 12  . Omega-3 Fatty Acids (FISH OIL) 1000 MG CAPS Take 1 capsule by mouth daily.    [name] oxyCODONE-acetaminophen (PERCOCET) 5-325 MG tablet Take 2 tablets by mouth every 6 (six) hours as needed for severe pain. 20 tablet 0  . tiZANidine (ZANAFLEX) 4 MG tablet Take 4 mg by mouth 3 (three) times daily as needed. For muscle spasms    . valACYclovir (VALTREX) 1000 MG tablet Take 1 tablet (1,000 mg  total) by mouth 3 (three) times daily. 21 tablet 0  . valsartan-hydrochlorothiazide (DIOVAN-HCT) 320-25 MG tablet Take 1 tablet by mouth daily.    Alveda Reasons 20 MG TABS tablet Take 20 mg by mouth every morning.      No current facility-administered medications for this visit.   Allergies:  Patient has no known allergies.   Social History: The patient  reports that he quit smoking about 20 years ago. His smoking use included cigarettes. He has a 40.00 pack-year smoking history. He has never used smokeless tobacco. He reports current alcohol use. He reports that he does not use drugs.  Family History: The patient's family history includes Cancer in his mother; Hypertension in his father; Stroke in his father.   ROS:   Chronic neuropathy symptoms in his legs and hands.  Physical Exam: VS:  BP (!) 160/80   Pulse 98   Temp 98.2 F (36.8 C)   Ht 6\' 2"  (1.88 m)   Wt 234 lb (106.1 kg)   SpO2 98%   BMI 30.04 kg/m , BMI Body mass index is 30.04 kg/m.  Wt Readings from Last 3 Encounters:  07/26/19 234 lb (106.1 kg)  04/23/19 225 lb (102.1 kg)  09/13/15 230 lb (104.3 kg)    Right arm 144/78, left arm 150/70 Right arm 138/90, left arm 144/82  General: Patient appears comfortable at rest. HEENT: Conjunctiva and lids normal, wearing a mask. Neck: Supple, no elevated JVP or carotid bruits, no thyromegaly. Lungs: Clear to auscultation, nonlabored breathing at rest. Thorax: No bruit. Cardiac: Regular rate and rhythm, no S3 or significant systolic murmur, no pericardial rub. Abdomen: Soft, nontender, bowel sounds present. Extremities: No pitting edema, distal pulses 2+. Skin: Warm and dry. Musculoskeletal: No kyphosis. Neuropsychiatric: Alert and oriented x3, affect grossly appropriate.  ECG:  An ECG dated 02/09/2015 was personally reviewed today and demonstrated:  Sinus tachycardia with left atrial enlargement and nonspecific ST changes.  Recent Labwork:  No recent lab work available for  review.  Other Studies Reviewed Today:  Echocardiogram 06/12/2012: Study Conclusions   - Left ventricle: The cavity size was normal. Wall thickness  was increased in a pattern of mild LVH. Systolic function  was normal. The estimated ejection fraction was in the  range of 55% to 60%. Wall motion was normal; there were no  regional wall motion abnormalities. Features are  consistent with a pseudonormal left ventricular filling  pattern, with concomitant abnormal relaxation and  increased filling pressure (grade 2 diastolic  dysfunction).  - Aortic valve: Trileaflet; moderately calcified leaflets.  Sclerosis without stenosis. Valve area: 1.4cm^2(VTI).  Valve area: 1.38cm^2 (Vmax).  - Mitral valve: Trivial regurgitation.  - Left atrium: The atrium was mildly dilated.  - Right ventricle: The cavity size was normal. Systolic  function was normal.  - Pulmonary arteries: No complete TR doppler jet so unable  to estimate PA systolic pressure.  - Inferior vena cava: The vessel was normal in size; the  respirophasic diameter changes were in the normal range (=  50%); findings are consistent with normal central venous  pressure.  Impressions:   - Technically difficult study with poor acoustic windows.  Normal LV size with mild LV hypertrophy. EF 55-60%.  Moderate diastolic dysfunction. Normal RV size and  systolic function. Aortic sclerosis without stenosis.  Assessment and Plan:  1.  Essential hypertension.  Patient was referred with concern for differential blood pressures in the upper extremities by 30 mmHg.  I wonder whether this was a spurious measurement as we did 2 separate manual blood pressure checks in both arms today and did not find a clinically significant difference as outlined above.  He has no thoracic bruits.  At this point, no further imaging studies are being pursued.  Would keep follow-up with PCP.  2.  Previous history of DVT and  pulmonary embolus in 2016.  He has been on long-term anticoagulation, currently Xarelto.  I am not certain whether he had a thrombophilia evaluation which indicated long-term treatment or not.  If this remains a question, would suggest referral to a hematologist to do a thrombophilia work-up and determine whether he clearly needs to be anticoagulated lifelong or not.  3.  Atherosclerosis documented by previous CT imaging.  He is asymptomatic without angina or unusual breathlessness, walking a mile a day.  Would focus on risk factor modification strategies with PCP.  Ideally LDL under 70.  If he is hesitant to consider statin therapy, could consider Zetia.  4.  Peripheral neuropathy, hands and feet in the absence of documented type 2 diabetes mellitus.  Could consider referral to a different neurologist for second opinion.  He does have lumbar disc disease which could at least partially explain his leg symptoms.  Medication Adjustments/Labs and Tests Ordered: Current medicines are reviewed at length with the patient today.  Concerns regarding medicines are outlined above.   Tests Ordered: No orders of the defined types were placed in this encounter.   Medication Changes: No orders of the defined types were placed in this encounter.   Disposition:  Follow up prn  Signed, Satira Sark, MD, [facility] 07/26/2019 1:47 PM    [place] [place]. [address], [place], [place] [address] Phone: [phone]; Fax: [phone]

## 2019-07-26 ENCOUNTER — Ambulatory Visit (INDEPENDENT_AMBULATORY_CARE_PROVIDER_SITE_OTHER): Payer: PRIVATE HEALTH INSURANCE | Admitting: Cardiology

## 2019-07-26 ENCOUNTER — Other Ambulatory Visit: Payer: Self-pay

## 2019-07-26 ENCOUNTER — Encounter: Payer: Self-pay | Admitting: Cardiology

## 2019-07-26 VITALS — BP 160/80 | HR 98 | Temp 98.2°F | Ht 74.0 in | Wt 234.0 lb

## 2019-07-26 DIAGNOSIS — I10 Essential (primary) hypertension: Secondary | ICD-10-CM

## 2019-07-26 DIAGNOSIS — Z86711 Personal history of pulmonary embolism: Secondary | ICD-10-CM | POA: Diagnosis not present

## 2019-07-26 DIAGNOSIS — E782 Mixed hyperlipidemia: Secondary | ICD-10-CM

## 2019-07-26 DIAGNOSIS — I700 Atherosclerosis of aorta: Secondary | ICD-10-CM

## 2019-07-26 DIAGNOSIS — I998 Other disorder of circulatory system: Secondary | ICD-10-CM

## 2019-07-26 NOTE — Patient Instructions (Addendum)
Medication Instructions:  Your physician recommends that you continue on your current medications as directed. Please refer to the Current Medication list given to you today.  *If you need a refill on your cardiac medications before your next appointment, please call your pharmacy*   Lab Work: None  If you have labs (blood work) drawn today and your tests are completely normal, you will receive your results only by: [name] MyChart Message (if you have MyChart) OR . A paper copy in the mail If you have any lab test that is abnormal or we need to change your treatment, we will call you to review the results.   Testing/Procedures: None    Follow-Up: No follow up needed       Thank you for choosing [facility] !

## 2019-07-27 ENCOUNTER — Telehealth: Payer: Self-pay | Admitting: Cardiology

## 2019-07-27 NOTE — Telephone Encounter (Signed)
Please give pt a call @ [phone] about a neurologists

## 2019-07-30 NOTE — Telephone Encounter (Signed)
Pt returned call. He wanted to know of a neurologist in [place]. I gave him Dr. Merlene Laughter in [place].

## 2019-07-30 NOTE — Telephone Encounter (Signed)
Returned pt call. No answer. Left message for pt to return call.  

## 2019-08-09 DIAGNOSIS — D72829 Elevated white blood cell count, unspecified: Secondary | ICD-10-CM | POA: Diagnosis not present

## 2019-08-16 ENCOUNTER — Encounter (HOSPITAL_COMMUNITY): Payer: Self-pay

## 2019-08-16 ENCOUNTER — Inpatient Hospital Stay (HOSPITAL_COMMUNITY): Payer: Medicare Other

## 2019-08-16 ENCOUNTER — Other Ambulatory Visit: Payer: Self-pay

## 2019-08-16 ENCOUNTER — Emergency Department (HOSPITAL_COMMUNITY): Payer: Medicare Other

## 2019-08-16 ENCOUNTER — Inpatient Hospital Stay (HOSPITAL_COMMUNITY)
Admission: EM | Admit: 2019-08-16 | Discharge: 2019-08-17 | DRG: 282 | Disposition: A | Discharge status: Home / Self Care | Payer: Medicare Other | Attending: Cardiology | Admitting: Cardiology

## 2019-08-16 ENCOUNTER — Encounter (HOSPITAL_COMMUNITY)
Admission: EM | Disposition: A | Discharge status: Home / Self Care | Payer: Self-pay | Source: Home / Self Care | Attending: Internal Medicine

## 2019-08-16 DIAGNOSIS — Z20822 Contact with and (suspected) exposure to covid-19: Secondary | ICD-10-CM | POA: Diagnosis present

## 2019-08-16 DIAGNOSIS — G629 Polyneuropathy, unspecified: Secondary | ICD-10-CM | POA: Diagnosis not present

## 2019-08-16 DIAGNOSIS — Z86711 Personal history of pulmonary embolism: Secondary | ICD-10-CM | POA: Diagnosis not present

## 2019-08-16 DIAGNOSIS — M109 Gout, unspecified: Secondary | ICD-10-CM | POA: Diagnosis not present

## 2019-08-16 DIAGNOSIS — J449 Chronic obstructive pulmonary disease, unspecified: Secondary | ICD-10-CM | POA: Diagnosis present

## 2019-08-16 DIAGNOSIS — M5136 Other intervertebral disc degeneration, lumbar region: Secondary | ICD-10-CM | POA: Diagnosis present

## 2019-08-16 DIAGNOSIS — Z809 Family history of malignant neoplasm, unspecified: Secondary | ICD-10-CM | POA: Diagnosis not present

## 2019-08-16 DIAGNOSIS — K219 Gastro-esophageal reflux disease without esophagitis: Secondary | ICD-10-CM | POA: Diagnosis present

## 2019-08-16 DIAGNOSIS — G8929 Other chronic pain: Secondary | ICD-10-CM | POA: Diagnosis not present

## 2019-08-16 DIAGNOSIS — Z823 Family history of stroke: Secondary | ICD-10-CM | POA: Diagnosis not present

## 2019-08-16 DIAGNOSIS — Z8249 Family history of ischemic heart disease and other diseases of the circulatory system: Secondary | ICD-10-CM | POA: Diagnosis not present

## 2019-08-16 DIAGNOSIS — F17210 Nicotine dependence, cigarettes, uncomplicated: Secondary | ICD-10-CM | POA: Diagnosis present

## 2019-08-16 DIAGNOSIS — E785 Hyperlipidemia, unspecified: Secondary | ICD-10-CM | POA: Diagnosis not present

## 2019-08-16 DIAGNOSIS — G2581 Restless legs syndrome: Secondary | ICD-10-CM | POA: Diagnosis not present

## 2019-08-16 DIAGNOSIS — Z7901 Long term (current) use of anticoagulants: Secondary | ICD-10-CM

## 2019-08-16 DIAGNOSIS — R778 Other specified abnormalities of plasma proteins: Secondary | ICD-10-CM

## 2019-08-16 DIAGNOSIS — I350 Nonrheumatic aortic (valve) stenosis: Secondary | ICD-10-CM | POA: Diagnosis not present

## 2019-08-16 DIAGNOSIS — R000 Tachycardia, unspecified: Secondary | ICD-10-CM

## 2019-08-16 DIAGNOSIS — R7989 Other specified abnormal findings of blood chemistry: Secondary | ICD-10-CM | POA: Diagnosis not present

## 2019-08-16 DIAGNOSIS — I10 Essential (primary) hypertension: Secondary | ICD-10-CM

## 2019-08-16 DIAGNOSIS — Z86718 Personal history of other venous thrombosis and embolism: Secondary | ICD-10-CM | POA: Diagnosis not present

## 2019-08-16 DIAGNOSIS — I2510 Atherosclerotic heart disease of native coronary artery without angina pectoris: Secondary | ICD-10-CM

## 2019-08-16 DIAGNOSIS — M79602 Pain in left arm: Secondary | ICD-10-CM | POA: Diagnosis not present

## 2019-08-16 DIAGNOSIS — I214 Non-ST elevation (NSTEMI) myocardial infarction: Secondary | ICD-10-CM | POA: Diagnosis not present

## 2019-08-16 HISTORY — PX: LEFT HEART CATH AND CORONARY ANGIOGRAPHY: CATH118249

## 2019-08-16 HISTORY — PX: INTRAVASCULAR ULTRASOUND/IVUS: CATH118244

## 2019-08-16 LAB — TROPONIN I (HIGH SENSITIVITY)
Troponin I (High Sensitivity): 10 ng/L (ref <18)
Troponin I (High Sensitivity): 66 ng/L — ABNORMAL HIGH (ref <18)
Troponin I (High Sensitivity): 82 ng/L — ABNORMAL HIGH (ref <18)
Troponin I (High Sensitivity): 65 ng/L — ABNORMAL HIGH (ref <18)

## 2019-08-16 LAB — CBC WITH DIFFERENTIAL/PLATELET
Abs Immature Granulocytes: 0.07 10*3/uL (ref 0.00–0.07)
Basophils Absolute: 0.1 10*3/uL (ref 0.0–0.1)
Basophils Relative: 1 %
Eosinophils Absolute: 0.5 10*3/uL (ref 0.0–0.5)
Eosinophils Relative: 4 %
HCT: 47.5 % (ref 39.0–52.0)
Hemoglobin: 16.5 g/dL (ref 13.0–17.0)
Immature Granulocytes: 1 %
Lymphocytes Relative: 36 %
Lymphs Abs: 4.5 10*3/uL — ABNORMAL HIGH (ref 0.7–4.0)
MCH: 33.7 pg (ref 26.0–34.0)
MCHC: 34.7 g/dL (ref 30.0–36.0)
MCV: 97.1 fL (ref 80.0–100.0)
Monocytes Absolute: 1 10*3/uL (ref 0.1–1.0)
Monocytes Relative: 8 %
Neutro Abs: 6.2 10*3/uL (ref 1.7–7.7)
Neutrophils Relative %: 50 %
Platelets: 201 10*3/uL (ref 150–400)
RBC: 4.89 MIL/uL (ref 4.22–5.81)
RDW: 12.1 % (ref 11.5–15.5)
WBC: 12.4 10*3/uL — ABNORMAL HIGH (ref 4.0–10.5)
nRBC: 0 % (ref 0.0–0.2)

## 2019-08-16 LAB — ECHOCARDIOGRAM COMPLETE
Height: 74 in
Weight: 3830.71 oz

## 2019-08-16 LAB — SARS CORONAVIRUS 2 (TAT 6-24 HRS): SARS Coronavirus 2: NEGATIVE

## 2019-08-16 LAB — BASIC METABOLIC PANEL
Anion gap: 15 (ref 5–15)
BUN: 22 mg/dL (ref 8–23)
CO2: 21 mmol/L — ABNORMAL LOW (ref 22–32)
Calcium: 9.5 mg/dL (ref 8.9–10.3)
Chloride: 96 mmol/L — ABNORMAL LOW (ref 98–111)
Creatinine, Ser: 0.9 mg/dL (ref 0.61–1.24)
GFR calc Af Amer: >60 mL/min (ref >60)
GFR calc non Af Amer: >60 mL/min (ref >60)
Glucose, Bld: 133 mg/dL — ABNORMAL HIGH (ref 70–99)
Potassium: 3.5 mmol/L (ref 3.5–5.1)
Sodium: 132 mmol/L — ABNORMAL LOW (ref 135–145)

## 2019-08-16 LAB — HEPARIN LEVEL (UNFRACTIONATED): Heparin Unfractionated: 1.72 IU/mL — ABNORMAL HIGH (ref 0.30–0.70)

## 2019-08-16 LAB — BRAIN NATRIURETIC PEPTIDE: B Natriuretic Peptide: 33 pg/mL (ref 0.0–100.0)

## 2019-08-16 LAB — APTT: aPTT: 33 seconds (ref 24–36)

## 2019-08-16 LAB — POCT ACTIVATED CLOTTING TIME: Activated Clotting Time: 202 seconds

## 2019-08-16 SURGERY — LEFT HEART CATH AND CORONARY ANGIOGRAPHY
Anesthesia: LOCAL

## 2019-08-16 MED ORDER — HEPARIN SODIUM (PORCINE) 1000 UNIT/ML IJ SOLN
INTRAMUSCULAR | Status: AC
  Filled 2019-08-16: qty 1

## 2019-08-16 MED ORDER — LIDOCAINE HCL (PF) 1 % IJ SOLN
INTRAMUSCULAR | Status: DC | PRN
  Administered 2019-08-16: 3 mL

## 2019-08-16 MED ORDER — FENTANYL CITRATE (PF) 100 MCG/2ML IJ SOLN
INTRAMUSCULAR | Status: DC | PRN
  Administered 2019-08-16 (×2): 25 ug via INTRAVENOUS

## 2019-08-16 MED ORDER — ASPIRIN 81 MG PO CHEW
81.0000 mg | CHEWABLE_TABLET | Freq: Every day | ORAL | Status: DC
  Administered 2019-08-17: 81 mg via ORAL
  Filled 2019-08-16: qty 1

## 2019-08-16 MED ORDER — SODIUM CHLORIDE 0.9 % IV SOLN: 250.0000 mL | INTRAVENOUS | Status: DC | PRN

## 2019-08-16 MED ORDER — VERAPAMIL HCL 2.5 MG/ML IV SOLN
INTRAVENOUS | Status: AC
  Filled 2019-08-16: qty 2

## 2019-08-16 MED ORDER — HEPARIN SODIUM (PORCINE) 1000 UNIT/ML IJ SOLN
INTRAMUSCULAR | Status: DC | PRN
  Administered 2019-08-16: 6000 [IU] via INTRAVENOUS
  Administered 2019-08-16: 3000 [IU] via INTRAVENOUS

## 2019-08-16 MED ORDER — NITROGLYCERIN 0.4 MG SL SUBL
0.4000 mg | SUBLINGUAL_TABLET | SUBLINGUAL | Status: DC | PRN
  Administered 2019-08-16: 02:00:00 0.4 mg via SUBLINGUAL
  Filled 2019-08-16: qty 1

## 2019-08-16 MED ORDER — HEPARIN (PORCINE) IN NACL 1000-0.9 UT/500ML-% IV SOLN
INTRAVENOUS | Status: DC | PRN
  Administered 2019-08-16 (×2): 500 mL

## 2019-08-16 MED ORDER — VERAPAMIL HCL 2.5 MG/ML IV SOLN
INTRAVENOUS | Status: DC | PRN
  Administered 2019-08-16: 15:00:00 10 mL via INTRA_ARTERIAL

## 2019-08-16 MED ORDER — IOHEXOL 350 MG/ML SOLN
INTRAVENOUS | Status: DC | PRN
  Administered 2019-08-16: 130 mL

## 2019-08-16 MED ORDER — LIDOCAINE HCL (PF) 1 % IJ SOLN
INTRAMUSCULAR | Status: AC
  Filled 2019-08-16: qty 30

## 2019-08-16 MED ORDER — HEPARIN (PORCINE) 25000 UT/250ML-% IV SOLN
1200.0000 [IU]/h | INTRAVENOUS | Status: DC
  Administered 2019-08-16: 1200 [IU]/h via INTRAVENOUS
  Filled 2019-08-16: qty 250

## 2019-08-16 MED ORDER — SODIUM CHLORIDE 0.9% FLUSH
3.0000 mL | Freq: Two times a day (BID) | INTRAVENOUS | Status: DC
  Administered 2019-08-17: 3 mL via INTRAVENOUS

## 2019-08-16 MED ORDER — LOSARTAN POTASSIUM 50 MG PO TABS
50.0000 mg | ORAL_TABLET | Freq: Every day | ORAL | Status: DC
  Administered 2019-08-17: 50 mg via ORAL
  Filled 2019-08-16: qty 1

## 2019-08-16 MED ORDER — HYDRALAZINE HCL 20 MG/ML IJ SOLN: 10.0000 mg | INTRAMUSCULAR | Status: AC | PRN

## 2019-08-16 MED ORDER — LABETALOL HCL 5 MG/ML IV SOLN: 10.0000 mg | INTRAVENOUS | Status: AC | PRN

## 2019-08-16 MED ORDER — SODIUM CHLORIDE 0.9 % IV SOLN: INTRAVENOUS | Status: AC

## 2019-08-16 MED ORDER — SODIUM CHLORIDE 0.9 % IV BOLUS
1000.0000 mL | Freq: Once | INTRAVENOUS | Status: AC
  Administered 2019-08-16: 02:00:00 1000 mL via INTRAVENOUS

## 2019-08-16 MED ORDER — ACETAMINOPHEN 325 MG PO TABS: 650.0000 mg | ORAL_TABLET | ORAL | Status: DC | PRN

## 2019-08-16 MED ORDER — MIDAZOLAM HCL 2 MG/2ML IJ SOLN
INTRAMUSCULAR | Status: AC
  Filled 2019-08-16: qty 2

## 2019-08-16 MED ORDER — ASPIRIN 81 MG PO CHEW
324.0000 mg | CHEWABLE_TABLET | Freq: Once | ORAL | Status: AC
  Administered 2019-08-16: 324 mg via ORAL
  Filled 2019-08-16: qty 4

## 2019-08-16 MED ORDER — HEPARIN (PORCINE) IN NACL 1000-0.9 UT/500ML-% IV SOLN
INTRAVENOUS | Status: AC
  Filled 2019-08-16: qty 1000

## 2019-08-16 MED ORDER — ROSUVASTATIN CALCIUM 20 MG PO TABS
20.0000 mg | ORAL_TABLET | Freq: Every day | ORAL | Status: DC
  Administered 2019-08-16: 20 mg via ORAL
  Filled 2019-08-16 (×2): qty 1

## 2019-08-16 MED ORDER — SODIUM CHLORIDE 0.9 % WEIGHT BASED INFUSION
3.0000 mL/kg/h | INTRAVENOUS | Status: DC
  Administered 2019-08-16: 3 mL/kg/h via INTRAVENOUS

## 2019-08-16 MED ORDER — ASPIRIN 81 MG PO CHEW
81.0000 mg | CHEWABLE_TABLET | ORAL | Status: AC
  Administered 2019-08-16: 81 mg via ORAL
  Filled 2019-08-16: qty 1

## 2019-08-16 MED ORDER — SODIUM CHLORIDE 0.9% FLUSH: 3.0000 mL | INTRAVENOUS | Status: DC | PRN

## 2019-08-16 MED ORDER — CARVEDILOL 3.125 MG PO TABS
3.1250 mg | ORAL_TABLET | Freq: Two times a day (BID) | ORAL | Status: DC
  Filled 2019-08-16 (×3): qty 1

## 2019-08-16 MED ORDER — HEPARIN (PORCINE) 25000 UT/250ML-% IV SOLN
1400.0000 [IU]/h | INTRAVENOUS | Status: DC
  Administered 2019-08-16: 1400 [IU]/h via INTRAVENOUS
  Filled 2019-08-16: qty 250

## 2019-08-16 MED ORDER — CARVEDILOL 6.25 MG PO TABS
6.2500 mg | ORAL_TABLET | Freq: Two times a day (BID) | ORAL | Status: DC
  Administered 2019-08-16 – 2019-08-17 (×3): 6.25 mg via ORAL
  Filled 2019-08-16: qty 2
  Filled 2019-08-16 (×2): qty 1

## 2019-08-16 MED ORDER — MIDAZOLAM HCL 2 MG/2ML IJ SOLN
INTRAMUSCULAR | Status: DC | PRN
  Administered 2019-08-16: 1 mg via INTRAVENOUS
  Administered 2019-08-16: 2 mg via INTRAVENOUS

## 2019-08-16 MED ORDER — SODIUM CHLORIDE 0.9 % WEIGHT BASED INFUSION
1.0000 mL/kg/h | INTRAVENOUS | Status: DC
  Administered 2019-08-16: 1 mL/kg/h via INTRAVENOUS

## 2019-08-16 MED ORDER — HEPARIN (PORCINE) 25000 UT/250ML-% IV SOLN: 1400.0000 [IU]/h | INTRAVENOUS | Status: DC

## 2019-08-16 MED ORDER — METOPROLOL TARTRATE 25 MG PO TABS: 25.0000 mg | ORAL_TABLET | Freq: Two times a day (BID) | ORAL | Status: DC

## 2019-08-16 MED ORDER — ASPIRIN EC 81 MG PO TBEC: 81.0000 mg | DELAYED_RELEASE_TABLET | Freq: Every day | ORAL | Status: DC

## 2019-08-16 MED ORDER — HYDROCHLOROTHIAZIDE 25 MG PO TABS
25.0000 mg | ORAL_TABLET | Freq: Every day | ORAL | Status: DC
  Administered 2019-08-17: 25 mg via ORAL
  Filled 2019-08-16: qty 1

## 2019-08-16 MED ORDER — FENTANYL CITRATE (PF) 100 MCG/2ML IJ SOLN
INTRAMUSCULAR | Status: AC
  Filled 2019-08-16: qty 2

## 2019-08-16 MED ORDER — ONDANSETRON HCL 4 MG/2ML IJ SOLN: 4.0000 mg | Freq: Four times a day (QID) | INTRAMUSCULAR | Status: DC | PRN

## 2019-08-16 SURGICAL SUPPLY — 16 items
CATH 5FR JL3.5 JR4 ANG PIG MP (CATHETERS) ×1 IMPLANT
CATH INFINITI 5 FR 3DRC (CATHETERS) ×1 IMPLANT
CATH INFINITI 5FR AL1 (CATHETERS) ×1 IMPLANT
CATH LAUNCHER 5F EBU3.5 (CATHETERS) ×1 IMPLANT
CATH OPTICROSS HD (CATHETERS) ×1 IMPLANT
DEVICE RAD COMP TR BAND LRG (VASCULAR PRODUCTS) ×1 IMPLANT
GLIDESHEATH SLEND SS 6F .021 (SHEATH) ×1 IMPLANT
GUIDEWIRE INQWIRE 1.5J.035X260 (WIRE) IMPLANT
INQWIRE 1.5J .035X260CM (WIRE) ×2
KIT HEART LEFT (KITS) ×2 IMPLANT
KIT HEMO VALVE WATCHDOG (MISCELLANEOUS) ×1 IMPLANT
PACK CARDIAC CATHETERIZATION (CUSTOM PROCEDURE TRAY) ×2 IMPLANT
SLED PULL BACK IVUS (MISCELLANEOUS) ×1 IMPLANT
TRANSDUCER W/STOPCOCK (MISCELLANEOUS) ×2 IMPLANT
TUBING CIL FLEX 10 FLL-RA (TUBING) ×2 IMPLANT
WIRE ASAHI PROWATER 180CM (WIRE) ×1 IMPLANT

## 2019-08-16 NOTE — Progress Notes (Signed)
*  PRELIMINARY RESULTS* Echocardiogram 2D Echocardiogram has been performed.  Leavy Cella 08/16/2019, 10:29 AM

## 2019-08-16 NOTE — H&P (View-Only) (Signed)
Cardiology Consultation:   Patient ID: Antonio Baker; [id number]; 10-31-1955   Admit date: 08/16/2019 Date of Consult: 08/16/2019  Primary Care Provider: Joyice Faster, FNP Primary Cardiologist: Rozann Lesches, MD 07/26/2019 Primary Electrophysiologist:  None   Patient Profile:   Antonio Baker is a 64 y.o. male with a hx of HTN, HLD, coronary Ca++ on CT 2016, DVT/PE 2016, COPD, periph neuropathy, RLS, snoring w/out OSA, who is being seen today for the evaluation of NSTEMI at the request of Dr Manuella Ghazi.  History of Present Illness:   Antonio Baker sent to Dr Domenic Polite because Antonio Baker was thought to have different BPs in each arm, but they were not significantly different.   Antonio Baker, Antonio Baker was in his USOH. Walked his usual mile w/out difficulty. Worked w/ dumbbells w/out problems. When Antonio Baker went to bed, Antonio Baker was fine.  Antonio Baker woke up shortly after midnight with L arm pain and aching. The aching was worse in the triceps area. Antonio Baker had been snoring. Antonio Baker also thought Antonio Baker was hungry (cannot characterize this further). Antonio Baker drove to the ER. By arrival, the L arm pain was gone.   Antonio Baker has never had it before. Has no hx exertional sx while exercising.   Antonio Baker has not had any left arm pain before.  Does not drop things.  No weakness or numbness in his left arm.  Antonio Baker has not had any new dyspnea on exertion, no orthopnea, occasional PND felt related to snoring and hypoxia while asleep.  No palpitations, no presyncope or syncope.  Does not follow his blood pressure closely, says it is okay at times and over 5 times.   Past Medical History:  Diagnosis Date  . Asthma   . Chronic pain   . COPD (chronic obstructive pulmonary disease) ([place])   . Degenerative disc disease, lumbar   . Essential hypertension   . Gout   . History of DVT (deep vein thrombosis)   . History of pulmonary embolus (PE) 2016  . Hyperlipidemia   . Peripheral neuropathic pain     Past Surgical History:  Procedure Laterality Date  .  ESOPHAGOGASTRODUODENOSCOPY  06/12/2012   [id number] hiatal hernia/[place] MOST LIKELY DUE TO CANDIDA ESOPHAGITIS/Single ulcer at the gastroesophageal junction-MOST LIKELY CAUSE FOR ODYNOPHAGIA/Stricture  at the gastroesophageal junction  . HERNIA REPAIR    . Right knee arthroscopy       Prior to Admission medications   Medication Sig Start Date End Date Taking? Authorizing Provider  acetaminophen (TYLENOL) 500 MG tablet Take 1 tablet (500 mg total) by mouth every 6 (six) hours as needed for mild pain. 02/10/15   [name], MD  albuterol (PROVENTIL HFA;VENTOLIN HFA) 108 (90 BASE) MCG/ACT inhaler Inhale 2 puffs into the lungs 3 (three) times daily. And every 4 hours as needed for wheezing, chest congestion, or shortness of breath. 05/17/12   [name], MD  albuterol (PROVENTIL) (5 MG/ML) 0.5% nebulizer solution Take 1 mL (5 mg total) by nebulization every 4 (four) hours as needed. For shortness of breath/wheezing 06/14/12   [name], MD  allopurinol (ZYLOPRIM) 300 MG tablet Take 300 mg by mouth 2 (two) times daily.     [provider]  APPLE CIDER VINEGAR PO Take 10 mLs by mouth daily.    [provider]  BREO ELLIPTA 100-25 MCG/INH AEPB Inhale 1 puff into the lungs daily. 01/30/15   [provider]  Cyanocobalamin (B-12 PO) Take 1 tablet by mouth daily.    [provider]  gabapentin (NEURONTIN) 300 MG capsule Take 300 mg by mouth 3 (three) times daily.  12/26/14   [provider]  GARLIC PO Take 1 tablet by mouth daily.    [provider]  ipratropium (ATROVENT) 0.02 % nebulizer solution Take 2.5 mLs (500 mcg total) by nebulization 4 (four) times daily. 07/04/12   [name], PA-C  Omega-3 Fatty Acids (FISH OIL) 1000 MG CAPS Take 1 capsule by mouth daily.    [provider]  oxyCODONE-acetaminophen (PERCOCET) 5-325 MG tablet Take 2 tablets by mouth every 6 (six) hours as needed for severe pain. 04/23/19    [name], [name], PA-C  tiZANidine (ZANAFLEX) 4 MG tablet Take 4 mg by mouth 3 (three) times daily as needed. For muscle spasms 08/19/15   [provider]  valACYclovir (VALTREX) 1000 MG tablet Take 1 tablet (1,000 mg total) by mouth 3 (three) times daily. 04/23/19   [name], [name], PA-C  valsartan-hydrochlorothiazide (DIOVAN-HCT) 320-25 MG tablet Take 1 tablet by mouth daily. 09/01/15   [provider]  XARELTO 20 MG TABS tablet Take 20 mg by mouth every morning.  09/01/15   [provider]    Inpatient Medications: Scheduled Meds: . carvedilol  3.125 mg Oral BID WC   Continuous Infusions:  PRN Meds: nitroGLYCERIN  Allergies:   No Known Allergies  Social History:   Social History   Socioeconomic History  . Marital status: Single    Spouse name: Not on file  . Number of children: Not on file  . Years of education: Not on file  . Highest education level: Not on file  Occupational History  . Not on file  Tobacco Use  . Smoking status: Former Smoker    Packs/day: 2.00    Years: 20.00    Pack years: 40.00    Types: Cigarettes    Quit date: 05/16/1999    Years since quitting: 20.2  . Smokeless tobacco: Never Used  Substance and Sexual Activity  . Alcohol use: Yes    Comment: occasional  . Drug use: No  . Sexual activity: Yes  Other Topics Concern  . Not on file  Social History Narrative  . Not on file   Social Determinants of Health   Financial Resource Strain:   . Difficulty of Paying Living Expenses:   Food Insecurity:   . Worried About Charity fundraiser in the Last Year:   . Arboriculturist in the Last Year:   Transportation Needs:   . Film/video editor (Medical):   [name] Lack of Transportation (Non-Medical):   Physical Activity:   . Days of Exercise per Week:   . Minutes of Exercise per Session:   Stress:   . Feeling of Stress :   Social Connections:   . Frequency of Communication with Friends and Family:   . Frequency of  Social Gatherings with Friends and Family:   . Attends Religious Services:   . Active Member of Clubs or Organizations:   . Attends Archivist Meetings:   [name] Marital Status:   Intimate Partner Violence:   . Fear of Current or Ex-Partner:   . Emotionally Abused:   [name] Physically Abused:   . Sexually Abused:     Family History:   Family History  Problem Relation Age of Onset  . Stroke Father   . Hypertension Father   . Cancer Mother    Family Status:  Family Status  Relation Name Status  . Father  Deceased  .  Mother  Deceased    ROS:  Please see the history of present illness.  All other ROS reviewed and negative.     Physical Exam/Data:   Vitals:   08/16/19 0530 08/16/19 0600 08/16/19 0630 08/16/19 0800  BP: (!) 174/93 (!) 145/78 (!) 180/97 (!) 147/89  Pulse: (!) 127 (!) 101 (!) 108 99  Resp: (!) 23 14 15 15   Temp:      TempSrc:      SpO2: 97% 94% 95% 91%  Weight:      Height:        Intake/Output Summary (Last 24 hours) at 08/16/2019 0843 Last data filed at 08/16/2019 0547 Gross per 24 hour  Intake 1000 ml  Output 500 ml  Net 500 ml    Last 3 Weights 08/16/2019 07/26/2019 04/23/2019  Weight (lbs) 235 lb 234 lb 225 lb  Weight (kg) 106.595 kg 106.142 kg 102.059 kg     Body mass index is 30.17 kg/m.   General:  Well nourished, well developed, male in no acute distress HEENT: normal Lymph: no adenopathy Neck: JVD - not elevated Endocrine:  No thryomegaly Vascular: No carotid bruits; 4/4 extremity pulses 2+; ?subclavian bruits Cardiac:  normal S1, S2; RRR; soft murmur upper R sternal border Lungs:  clear bilaterally, no wheezing, rhonchi or rales  Abd: soft, nontender, no hepatomegaly  Ext: no edema Musculoskeletal:  No deformities, BUE and BLE strength normal and equal Skin: warm and dry  Neuro:  CNs 2-12 intact, no focal abnormalities noted Psych:  Normal affect   EKG:  The EKG was personally reviewed and demonstrates:  SR, HR 98, likely early  repol, no sig change from 2016 Telemetry:  Telemetry was personally reviewed and demonstrates:  SR   CV studies:   ECHO: ordered ECHO: 2014 - Left ventricle: The cavity size was normal. Wall thickness  was increased in a pattern of mild LVH. Systolic function  was normal. The estimated ejection fraction was in the  range of 55% to 60%. Wall motion was normal; there were no  regional wall motion abnormalities. Features are  consistent with a pseudonormal left ventricular filling  pattern, with concomitant abnormal relaxation and  increased filling pressure (grade 2 diastolic  dysfunction).  - Aortic valve: Trileaflet; moderately calcified leaflets.  Sclerosis without stenosis. Valve area: 1.4cm^2(VTI).  Valve area: 1.38cm^2 (Vmax).  - Mitral valve: Trivial regurgitation.  - Left atrium: The atrium was mildly dilated.  - Right ventricle: The cavity size was normal. Systolic  function was normal.  - Pulmonary arteries: No complete TR doppler jet so unable  to estimate PA systolic pressure.  - Inferior vena cava: The vessel was normal in size; the  respirophasic diameter changes were in the normal range (=  50%); findings are consistent with normal central venous  pressure.  Impressions:   - Technically difficult study with poor acoustic windows.  Normal LV size with mild LV hypertrophy. EF 55-60%.  Moderate diastolic dysfunction. Normal RV size and  systolic function. Aortic sclerosis without stenosis.  Transthoracic echocardiography. M-mode, complete 2D,    Laboratory Data:   Chemistry Recent Labs  Lab 08/16/19 0214  NA 132*  K 3.5  CL 96*  CO2 21*  GLUCOSE 133*  BUN 22  CREATININE 0.90  CALCIUM 9.5  GFRNONAA >60  GFRAA >60  ANIONGAP 15    Lab Results  Component Value Date   ALT 33 09/13/2015   AST 24 09/13/2015   ALKPHOS 67 09/13/2015   BILITOT 0.3  09/13/2015   Hematology Recent Labs  Lab 08/16/19 0214  WBC 12.4*    RBC 4.89  HGB 16.5  HCT 47.5  MCV 97.1  MCH 33.7  MCHC 34.7  RDW 12.1  PLT 201   Cardiac Enzymes High Sensitivity Troponin:   Recent Labs  Lab 08/16/19 0214 08/16/19 0412 08/16/19 0736  TROPONINIHS 10 66* 82*      BNP Recent Labs  Lab 08/16/19 0215  BNP 33.0    DDimer No results for input(s): DDIMER in the last 168 hours. TSH: No results found for: TSH Lipids:No results found for: CHOL, HDL, LDLCALC, LDLDIRECT, TRIG, CHOLHDL HgbA1c: Lab Results  Component Value Date   HGBA1C 5.6 02/10/2015   Magnesium:  Magnesium  Date Value Ref Range Status  02/09/2015 2.1 1.7 - 2.4 mg/dL Final     Radiology/Studies:  DG Chest Port 1 View  Result Date: 08/16/2019 CLINICAL DATA:  Chest pain EXAM: PORTABLE CHEST 1 VIEW COMPARISON:  January 25, 2015 FINDINGS: The heart size and mediastinal contours are within normal limits. There is prominence of the central pulmonary vasculature. There is hyperinflation of the lung zones. No large airspace consolidation. The visualized skeletal structures are unremarkable. IMPRESSION: Mild pulmonary vascular congestion. Electronically Signed   By: Prudencio Pair M.D.   On: 08/16/2019 02:35    Assessment and Plan:   1.NSTEMI - EZ are trending up - no hx exertional sx, but Antonio Baker was wakened from sleep by L arm pain, ?anginal equivalent - advised him that Antonio Baker may need tx for cath, Dr Harl Bowie to make the final call. - continue ASA, add BB, ck lipids - will restart losartan for BP control, hold HCTZ since possible cath.  2. HTN - see above  3. HLD - prev stopped Zocor, thought it was related to neuropathy - will try different statin, Crestor 20 mg  Active Problems:   NSTEMI (non-ST elevated myocardial infarction) [facility])     For questions or updates, please contact [place] Please consult [email or web address] for contact info under Cardiology/STEMI.   Jonetta Speak, PA-C  08/16/2019 8:43 AM  Attending note Patient seen and  discussed with PA Barrett, I agree with her documentaiton above. 64 yo male history of prior PE on xarelto long term, CAD by incidental finding on CT scan. HL, notes mention off statin due to concenrs it was worsening his neuropathy. COPD, HTN. Presents with chest pain  Sudden onset of left arm pain around 1AM, awoke him from sleep. Throbbing like pain. Denied any SOB or diaphoresis,pain lasted about 16min and resolved on its own. No recurrent symptoms. Reports strict compliance with xarelto, last dose yesterday at 9AM   WBC 12.4 Plt 201 K 3.5 Cr 0.9 BNP 33  hstrop 10-->66-->82 COVID pending CXR mild congestion EKG sinus tach with inferior ST depressions that resolved on later tracings Prelim echo read LVE 65%, no WMAs  64 yo male with CAD risk factors and noted CAD as an incidental findings on prior chest CT presents with sudden left arm pain. Mild troponin but notiecable +delta, had initial ST depressions at higher heart rates that have since resolved. Prelim echo shows normal LVEF and no WMAs. Overall presentation concerning for NSTEMI, we will plan for cath.   Medical therapy with hep gtt, ASA for now while off xarelto, coreg, crestor 20. Consider ACEI after cath pending findings.  History of PE, hold xarelto at this time, start hep gtt.     I have reviewed the risks, indications, and alternatives  to cardiac catheterization, possible angioplasty, and stenting with the patient today. Risks include but are not limited to bleeding, infection, vascular injury, stroke, myocardial infection, arrhythmia, kidney injury, radiation-related injury in the case of prolonged fluoroscopy use, emergency cardiac surgery, and death. The patient understands the risks of serious complication is 1-2 in [id number] with diagnostic cardiac cath and 1-2% or less with angioplasty/stenting.   Carlyle Dolly MD

## 2019-08-16 NOTE — ED Notes (Signed)
ED Provider at bedside. 

## 2019-08-16 NOTE — Progress Notes (Signed)
ANTICOAGULATION CONSULT NOTE - Initial Consult  Pharmacy Consult for Heparin Indication: chest pain/ACS  No Known Allergies  Patient Measurements: Height: 6\' 2"  (188 cm) Weight: 235 lb (106.6 kg) IBW/kg (Calculated) : 82.2 Heparin Dosing Weight: 104 kg  Vital Signs: Temp: 98.4 F (36.9 C) (03/25 0139) Temp Source: Oral (03/25 0139) BP: 138/77 (03/25 0500) Pulse Rate: 103 (03/25 0500)  Labs: Recent Labs    08/16/19 0214 08/16/19 0412  HGB 16.5  --   HCT 47.5  --   PLT 201  --   CREATININE 0.90  --   TROPONINIHS 10 66*    Estimated Creatinine Clearance: 105.1 mL/min (by C-G formula based on SCr of 0.9 mg/dL).   Medical History: Past Medical History:  Diagnosis Date  . Asthma   . Chronic pain   . COPD (chronic obstructive pulmonary disease) ([place])   . Degenerative disc disease, lumbar   . Essential hypertension   . Gout   . History of DVT (deep vein thrombosis)   . History of pulmonary embolus (PE)   . Hyperlipidemia   . Peripheral neuropathic pain     Assessment: 64 YOM on Xarelto PTA for hx DVT/PE who presented to [facility] 3/25 with L-arm pain and concern for ACS. Pharmacy consulted to start Heparin for ACS/CP.   The patient's last dose of Xarelto was around 0900 on 3/24 AM. Will plan to initiate Heparin ~24h after the patient's last Xarelto dose. Heparin levels will likely be falsely elevated due to recent use - so will utilize aPTTs for monitoring.   Goal of Therapy:  Heparin level 0.3-0.7 units/ml aPTT 66-102 seconds Monitor platelets by anticoagulation protocol: Yes   Plan:  - Start Heparin at a rate of 1400 units/hr starting at 0900 on 3/25 - Will obtain a baseline aPTT/HL - Will continue to monitor for any signs/symptoms of bleeding and will follow up with an aPTT and heparin level in 6 hours   Thank you for allowing pharmacy to be a part of this patient's care.  Alycia Rossetti, PharmD, BCPS Clinical Pharmacist 08/16/2019 5:47 AM    **Pharmacist phone directory can now be found on [email or web address] ([email or web address]).  Listed under [place].

## 2019-08-16 NOTE — ED Provider Notes (Signed)
[facility] EMERGENCY DEPARTMENT Provider Note   CSN: [id number] Arrival date & time: 08/16/19  0120   History Chief Complaint  Patient presents with  . Arm Pain    Antonio Baker is a 64 y.o. male.  The history is provided by the patient.  Arm Pain  He has history of hypertension, hyperlipidemia, asthma and comes in because his heart is racing.  He woke up and noted that he had some aching in his left upper arm and then noted his heart was racing.  Pain in his left arm is dull and he rates it at 4/10.  He denies dyspnea, nausea, diaphoresis.  He states he has had problems with his heart racing the past, but never this bad.  He is anticoagulated on rivaroxaban because of history of DVT and pulmonary embolism.  He is a non-smoker.  He denies history of diabetes and denies family history of premature coronary atherosclerosis.  Past Medical History:  Diagnosis Date  . Asthma   . Chronic pain   . COPD (chronic obstructive pulmonary disease) ([place])   . Degenerative disc disease, lumbar   . Essential hypertension   . Gout   . History of DVT (deep vein thrombosis)   . History of pulmonary embolus (PE)   . Hyperlipidemia   . Peripheral neuropathic pain     Patient Active Problem List   Diagnosis Date Noted  . Hypercalcemia 02/09/2015  . Left leg DVT ([place]) 02/08/2015  . Acute pulmonary embolism ([place]) 02/08/2015  . Dehydration 02/08/2015  . Leukocytosis 02/08/2015  . COPD (chronic obstructive pulmonary disease) ([place]) 02/08/2015  . AKI (acute kidney injury) ([place]) 02/07/2015  . Tobacco dependence   . Acute respiratory failure with hypoxia ([place]) 01/25/2015  . High cholesterol   . HCAP (healthcare-associated pneumonia) 06/11/2012  . COPD exacerbation ([place]) 06/10/2012  . GERD (gastroesophageal reflux disease) 06/10/2012  . Hyperglycemia, drug-induced 05/16/2012  . Tachycardia 05/16/2012  . Asthma with exacerbation 05/15/2012  . CAP (community acquired pneumonia) 05/15/2012  .  Hypertension 05/15/2012    Past Surgical History:  Procedure Laterality Date  . ESOPHAGOGASTRODUODENOSCOPY  06/12/2012   [id number] hiatal hernia/[place] MOST LIKELY DUE TO CANDIDA ESOPHAGITIS/Single ulcer at the gastroesophageal junction-MOST LIKELY CAUSE FOR ODYNOPHAGIA/Stricture  at the gastroesophageal junction  . HERNIA REPAIR    . Right knee arthroscopy         Family History  Problem Relation Age of Onset  . Stroke Father   . Hypertension Father   . Cancer Mother     Social History   Tobacco Use  . Smoking status: Former Smoker    Packs/day: 2.00    Years: 20.00    Pack years: 40.00    Types: Cigarettes    Quit date: 05/16/1999    Years since quitting: 20.2  . Smokeless tobacco: Never Used  Substance Use Topics  . Alcohol use: Yes    Comment: occasional  . Drug use: No    Home Medications Prior to Admission medications   Medication Sig Start Date End Date Taking? Authorizing Provider  acetaminophen (TYLENOL) 500 MG tablet Take 1 tablet (500 mg total) by mouth every 6 (six) hours as needed for mild pain. 02/10/15   [name], MD  albuterol (PROVENTIL HFA;VENTOLIN HFA) 108 (90 BASE) MCG/ACT inhaler Inhale 2 puffs into the lungs 3 (three) times daily. And every 4 hours as needed for wheezing, chest congestion, or shortness of breath. 05/17/12   [name], MD  albuterol (PROVENTIL) (5  MG/ML) 0.5% nebulizer solution Take 1 mL (5 mg total) by nebulization every 4 (four) hours as needed. For shortness of breath/wheezing 06/14/12   [name], MD  allopurinol (ZYLOPRIM) 300 MG tablet Take 300 mg by mouth 2 (two) times daily.     [provider]  APPLE CIDER VINEGAR PO Take 10 mLs by mouth daily.    [provider]  BREO ELLIPTA 100-25 MCG/INH AEPB Inhale 1 puff into the lungs daily. 01/30/15   [provider]  Cyanocobalamin (B-12 PO) Take 1 tablet by mouth daily.    [provider]  gabapentin (NEURONTIN) 300  MG capsule Take 300 mg by mouth 3 (three) times daily.  12/26/14   [provider]  GARLIC PO Take 1 tablet by mouth daily.    [provider]  ipratropium (ATROVENT) 0.02 % nebulizer solution Take 2.5 mLs (500 mcg total) by nebulization 4 (four) times daily. 07/04/12   [name], PA-C  Omega-3 Fatty Acids (FISH OIL) 1000 MG CAPS Take 1 capsule by mouth daily.    [provider]  oxyCODONE-acetaminophen (PERCOCET) 5-325 MG tablet Take 2 tablets by mouth every 6 (six) hours as needed for severe pain. 04/23/19   [name], [name], PA-C  tiZANidine (ZANAFLEX) 4 MG tablet Take 4 mg by mouth 3 (three) times daily as needed. For muscle spasms 08/19/15   [provider]  valACYclovir (VALTREX) 1000 MG tablet Take 1 tablet (1,000 mg total) by mouth 3 (three) times daily. 04/23/19   [name], [name], PA-C  valsartan-hydrochlorothiazide (DIOVAN-HCT) 320-25 MG tablet Take 1 tablet by mouth daily. 09/01/15   [provider]  XARELTO 20 MG TABS tablet Take 20 mg by mouth every morning.  09/01/15   [provider]    Allergies    Patient has no known allergies.  Review of Systems   Review of Systems  All other systems reviewed and are negative.   Physical Exam Updated Vital Signs BP (!) 150/96 (BP Location: Left Arm)   Pulse (!) 125   Temp 98.4 F (36.9 C) (Oral)   Resp 17   Ht 6\' 2"  (1.88 m)   Wt 106.6 kg   SpO2 95%   BMI 30.17 kg/m   Physical Exam Vitals and nursing note reviewed.   64 year old male, resting comfortably and in no acute distress. Vital signs are significant for elevated blood pressure and heart rate. Oxygen saturation is 95%, which is normal. Head is normocephalic and atraumatic. PERRLA, EOMI. Oropharynx is clear. Neck is nontender and supple without adenopathy or JVD. Back is nontender and there is no CVA tenderness. Lungs have few bibasilar rales without wheezes or rhonchi. Chest is nontender. Heart is tachycardic without  murmur. Abdomen is soft, flat, nontender without masses or hepatosplenomegaly and peristalsis is normoactive. Extremities have trace edema, full range of motion is present. Skin is warm and dry without rash. Neurologic: Mental status is normal, cranial nerves are intact, there are no motor or sensory deficits.  ED Results / Procedures / Treatments   Labs (all labs ordered are listed, but only abnormal results are displayed) Labs Reviewed  CBC WITH DIFFERENTIAL/PLATELET - Abnormal; Notable for the following components:      Result Value   WBC 12.4 (*)    Lymphs Abs 4.5 (*)    All other components within normal limits  BASIC METABOLIC PANEL - Abnormal; Notable for the following components:   Sodium 132 (*)    Chloride 96 (*)  CO2 21 (*)    Glucose, Bld 133 (*)    All other components within normal limits  TROPONIN I (HIGH SENSITIVITY) - Abnormal; Notable for the following components:   Troponin I (High Sensitivity) 66 (*)    All other components within normal limits  SARS CORONAVIRUS 2 (TAT 6-24 HRS)  BRAIN NATRIURETIC PEPTIDE  APTT  HEPARIN LEVEL (UNFRACTIONATED)  APTT  TROPONIN I (HIGH SENSITIVITY)    EKG EKG Interpretation  Date/Time:  Thursday August 16 2019 01:30:43 EDT Ventricular Rate:  129 PR Interval:    QRS Duration: 88 QT Interval:  274 QTC Calculation: 402 R Axis:   79 Text Interpretation: Sinus tachycardia Abnormal R-wave progression, early transition Borderline repolarization abnormality Baseline wander in lead(s) I II aVR aVF V2 V6 When compared with ECG of 02/09/2015, No significant change was found Confirmed by Delora Fuel ([id number]) on 08/16/2019 1:53:08 AM   Radiology DG Chest Port 1 View  Result Date: 08/16/2019 CLINICAL DATA:  Chest pain EXAM: PORTABLE CHEST 1 VIEW COMPARISON:  January 25, 2015 FINDINGS: The heart size and mediastinal contours are within normal limits. There is prominence of the central pulmonary vasculature. There is hyperinflation of  the lung zones. No large airspace consolidation. The visualized skeletal structures are unremarkable. IMPRESSION: Mild pulmonary vascular congestion. Electronically Signed   By: Prudencio Pair M.D.   On: 08/16/2019 02:35    Procedures Procedures  CRITICAL CARE Performed by: Delora Fuel Total critical care time: 45 minutes Critical care time was exclusive of separately billable procedures and treating other patients. Critical care was necessary to treat or prevent imminent or life-threatening deterioration. Critical care was time spent personally by me on the following activities: development of treatment plan with patient and/or surrogate as well as nursing, discussions with consultants, evaluation of patient's response to treatment, examination of patient, obtaining history from patient or surrogate, ordering and performing treatments and interventions, ordering and review of laboratory studies, ordering and review of radiographic studies, pulse oximetry and re-evaluation of patient's condition.  Medications Ordered in ED Medications  sodium chloride 0.9 % bolus 1,000 mL (has no administration in time range)  aspirin chewable tablet 324 mg (has no administration in time range)  nitroGLYCERIN (NITROSTAT) SL tablet 0.4 mg (has no administration in time range)    ED Course  I have reviewed the triage vital signs and the nursing notes.  Pertinent labs & imaging results that were available during my care of the patient were reviewed by me and considered in my medical decision making (see chart for details).  MDM Rules/Calculators/A&P Left arm pain which I am concerned is an angina equivalent.  Tachycardia of uncertain cause.  He will be given IV fluids.  He also be given aspirin and nitroglycerin.  He is anticoagulated on rivaroxaban, so pulmonary embolism is very unlikely.  Symptoms not suggestive of aortic dissection. [name] 3:30 AM Labs are unremarkable including normal troponin.  Chest x-ray  shows mild pulmonary vascular congestion. Arm pain has resolved, but he states he cannot clearly link the improvement to getting nitroglycerin.  Heart rate has come down to 117.  He still feels his heart racing, but states that it is not as bad.  Delta troponin is pending.  Repeat troponin has increased significantly.  He continues to be pain-free.  Heparin is not initiated because he is already anticoagulated.  Case is discussed with Dr. Scherrie November of [facility], who agrees to admit the patient.  He will need a cardiology consultation.  Final  Clinical Impression(s) / ED Diagnoses Final diagnoses:  Elevated troponin  Left arm pain  Sinus tachycardia    Rx / DC Orders ED Discharge Orders    None       Delora Fuel, MD [id number] [phone]

## 2019-08-16 NOTE — ED Triage Notes (Signed)
Pt arrived from home via POV c/o left arm pain and dull chest pain. Upon initial triage, Pts HR=128. Pt currently on Xerelto following treatment for a DVT 4 years ago. Pt A+O X 4.

## 2019-08-16 NOTE — Consult Note (Addendum)
Cardiology Consultation:   Patient ID: Antonio Baker; [id number]; 03/24/1956   Admit date: 08/16/2019 Date of Consult: 08/16/2019  Primary Care Provider: Joyice Faster, FNP Primary Cardiologist: Rozann Lesches, MD 07/26/2019 Primary Electrophysiologist:  None   Patient Profile:   Antonio BERGREN is a 64 y.o. male with a hx of HTN, HLD, coronary Ca++ on CT 2016, DVT/PE 2016, COPD, periph neuropathy, RLS, snoring w/out OSA, who is being seen today for the evaluation of NSTEMI at the request of Dr Manuella Ghazi.  History of Present Illness:   [name] sent to Dr Domenic Polite because he was thought to have different BPs in each arm, but they were not significantly different.   [name], he was in his USOH. Walked his usual mile w/out difficulty. Worked w/ dumbbells w/out problems. When he went to bed, he was fine.  He woke up shortly after midnight with L arm pain and aching. The aching was worse in the triceps area. He had been snoring. He also thought he was hungry (cannot characterize this further). He drove to the ER. By arrival, the L arm pain was gone.   He has never had it before. Has no hx exertional sx while exercising.   He has not had any left arm pain before.  Does not drop things.  No weakness or numbness in his left arm.  He has not had any new dyspnea on exertion, no orthopnea, occasional PND felt related to snoring and hypoxia while asleep.  No palpitations, no presyncope or syncope.  Does not follow his blood pressure closely, says it is okay at times and over 5 times.   Past Medical History:  Diagnosis Date   Asthma    Chronic pain    COPD (chronic obstructive pulmonary disease) (HCC)    Degenerative disc disease, lumbar    Essential hypertension    Gout    History of DVT (deep vein thrombosis)    History of pulmonary embolus (PE) 2016   Hyperlipidemia    Peripheral neuropathic pain     Past Surgical History:  Procedure Laterality Date    ESOPHAGOGASTRODUODENOSCOPY  06/12/2012   [id number] hiatal hernia/[place] MOST LIKELY DUE TO CANDIDA ESOPHAGITIS/Single ulcer at the gastroesophageal junction-MOST LIKELY CAUSE FOR ODYNOPHAGIA/Stricture  at the gastroesophageal junction   HERNIA REPAIR     Right knee arthroscopy       Prior to Admission medications   Medication Sig Start Date End Date Taking? Authorizing Provider  acetaminophen (TYLENOL) 500 MG tablet Take 1 tablet (500 mg total) by mouth every 6 (six) hours as needed for mild pain. 02/10/15   [name], MD  albuterol (PROVENTIL HFA;VENTOLIN HFA) 108 (90 BASE) MCG/ACT inhaler Inhale 2 puffs into the lungs 3 (three) times daily. And every 4 hours as needed for wheezing, chest congestion, or shortness of breath. 05/17/12   [name], MD  albuterol (PROVENTIL) (5 MG/ML) 0.5% nebulizer solution Take 1 mL (5 mg total) by nebulization every 4 (four) hours as needed. For shortness of breath/wheezing 06/14/12   [name], MD  allopurinol (ZYLOPRIM) 300 MG tablet Take 300 mg by mouth 2 (two) times daily.     [provider]  APPLE CIDER VINEGAR PO Take 10 mLs by mouth daily.    [provider]  BREO ELLIPTA 100-25 MCG/INH AEPB Inhale 1 puff into the lungs daily. 01/30/15   [provider]  Cyanocobalamin (B-12 PO) Take 1 tablet by mouth daily.    [provider]  gabapentin (NEURONTIN) 300 MG capsule Take 300 mg by mouth 3 (three) times daily.  12/26/14   [provider]  GARLIC PO Take 1 tablet by mouth daily.    [provider]  ipratropium (ATROVENT) 0.02 % nebulizer solution Take 2.5 mLs (500 mcg total) by nebulization 4 (four) times daily. 07/04/12   [name], PA-C  Omega-3 Fatty Acids (FISH OIL) 1000 MG CAPS Take 1 capsule by mouth daily.    [provider]  oxyCODONE-acetaminophen (PERCOCET) 5-325 MG tablet Take 2 tablets by mouth every 6 (six) hours as needed for severe pain. 04/23/19    [name], [name], PA-C  tiZANidine (ZANAFLEX) 4 MG tablet Take 4 mg by mouth 3 (three) times daily as needed. For muscle spasms 08/19/15   [provider]  valACYclovir (VALTREX) 1000 MG tablet Take 1 tablet (1,000 mg total) by mouth 3 (three) times daily. 04/23/19   [name], [name], PA-C  valsartan-hydrochlorothiazide (DIOVAN-HCT) 320-25 MG tablet Take 1 tablet by mouth daily. 09/01/15   [provider]  XARELTO 20 MG TABS tablet Take 20 mg by mouth every morning.  09/01/15   [provider]    Inpatient Medications: Scheduled Meds:  carvedilol  3.125 mg Oral BID WC   Continuous Infusions:  PRN Meds: nitroGLYCERIN  Allergies:   No Known Allergies  Social History:   Social History   Socioeconomic History   Marital status: Single    Spouse name: Not on file   Number of children: Not on file   Years of education: Not on file   Highest education level: Not on file  Occupational History   Not on file  Tobacco Use   Smoking status: Former Smoker    Packs/day: 2.00    Years: 20.00    Pack years: 40.00    Types: Cigarettes    Quit date: 05/16/1999    Years since quitting: 20.2   Smokeless tobacco: Never Used  Substance and Sexual Activity   Alcohol use: Yes    Comment: occasional   Drug use: No   Sexual activity: Yes  Other Topics Concern   Not on file  Social History Narrative   Not on file   Social Determinants of Health   Financial Resource Strain:    Difficulty of Paying Living Expenses:   Food Insecurity:    Worried About Charity fundraiser in the Last Year:    Arboriculturist in the Last Year:   Transportation Needs:    Film/video editor (Medical):    Lack of Transportation (Non-Medical):   Physical Activity:    Days of Exercise per Week:    Minutes of Exercise per Session:   Stress:    Feeling of Stress :   Social Connections:    Frequency of Communication with Friends and Family:    Frequency of  Social Gatherings with Friends and Family:    Attends Religious Services:    Active Member of Clubs or Organizations:    Attends Music therapist:    Marital Status:   Intimate Partner Violence:    Fear of Current or Ex-Partner:    Emotionally Abused:    Physically Abused:    Sexually Abused:     Family History:   Family History  Problem Relation Age of Onset   Stroke Father    Hypertension Father    Cancer Mother    Family Status:  Family Status  Relation Name Status   Father  Deceased  Mother  Deceased    ROS:  Please see the history of present illness.  All other ROS reviewed and negative.     Physical Exam/Data:   Vitals:   08/16/19 0530 08/16/19 0600 08/16/19 0630 08/16/19 0800  BP: (!) 174/93 (!) 145/78 (!) 180/97 (!) 147/89  Pulse: (!) 127 (!) 101 (!) 108 99  Resp: (!) 23 14 15 15   Temp:      TempSrc:      SpO2: 97% 94% 95% 91%  Weight:      Height:        Intake/Output Summary (Last 24 hours) at 08/16/2019 0843 Last data filed at 08/16/2019 0547 Gross per 24 hour  Intake 1000 ml  Output 500 ml  Net 500 ml    Last 3 Weights 08/16/2019 07/26/2019 04/23/2019  Weight (lbs) 235 lb 234 lb 225 lb  Weight (kg) 106.595 kg 106.142 kg 102.059 kg     Body mass index is 30.17 kg/m.   General:  Well nourished, well developed, male in no acute distress HEENT: normal Lymph: no adenopathy Neck: JVD - not elevated Endocrine:  No thryomegaly Vascular: No carotid bruits; 4/4 extremity pulses 2+; ?subclavian bruits Cardiac:  normal S1, S2; RRR; soft murmur upper R sternal border Lungs:  clear bilaterally, no wheezing, rhonchi or rales  Abd: soft, nontender, no hepatomegaly  Ext: no edema Musculoskeletal:  No deformities, BUE and BLE strength normal and equal Skin: warm and dry  Neuro:  CNs 2-12 intact, no focal abnormalities noted Psych:  Normal affect   EKG:  The EKG was personally reviewed and demonstrates:  SR, HR 98, likely early  repol, no sig change from 2016 Telemetry:  Telemetry was personally reviewed and demonstrates:  SR   CV studies:   ECHO: ordered ECHO: 2014 - Left ventricle: The cavity size was normal. Wall thickness  was increased in a pattern of mild LVH. Systolic function  was normal. The estimated ejection fraction was in the  range of 55% to 60%. Wall motion was normal; there were no  regional wall motion abnormalities. Features are  consistent with a pseudonormal left ventricular filling  pattern, with concomitant abnormal relaxation and  increased filling pressure (grade 2 diastolic  dysfunction).  - Aortic valve: Trileaflet; moderately calcified leaflets.  Sclerosis without stenosis. Valve area: 1.4cm^2(VTI).  Valve area: 1.38cm^2 (Vmax).  - Mitral valve: Trivial regurgitation.  - Left atrium: The atrium was mildly dilated.  - Right ventricle: The cavity size was normal. Systolic  function was normal.  - Pulmonary arteries: No complete TR doppler jet so unable  to estimate PA systolic pressure.  - Inferior vena cava: The vessel was normal in size; the  respirophasic diameter changes were in the normal range (=  50%); findings are consistent with normal central venous  pressure.  Impressions:   - Technically difficult study with poor acoustic windows.  Normal LV size with mild LV hypertrophy. EF 55-60%.  Moderate diastolic dysfunction. Normal RV size and  systolic function. Aortic sclerosis without stenosis.  Transthoracic echocardiography. M-mode, complete 2D,    Laboratory Data:   Chemistry Recent Labs  Lab 08/16/19 0214  NA 132*  K 3.5  CL 96*  CO2 21*  GLUCOSE 133*  BUN 22  CREATININE 0.90  CALCIUM 9.5  GFRNONAA >60  GFRAA >60  ANIONGAP 15    Lab Results  Component Value Date   ALT 33 09/13/2015   AST 24 09/13/2015   ALKPHOS 67 09/13/2015   BILITOT 0.3  09/13/2015   Hematology Recent Labs  Lab 08/16/19 0214  WBC 12.4*    RBC 4.89  HGB 16.5  HCT 47.5  MCV 97.1  MCH 33.7  MCHC 34.7  RDW 12.1  PLT 201   Cardiac Enzymes High Sensitivity Troponin:   Recent Labs  Lab 08/16/19 0214 08/16/19 0412 08/16/19 0736  TROPONINIHS 10 66* 82*      BNP Recent Labs  Lab 08/16/19 0215  BNP 33.0    DDimer No results for input(s): DDIMER in the last 168 hours. TSH: No results found for: TSH Lipids:No results found for: CHOL, HDL, LDLCALC, LDLDIRECT, TRIG, CHOLHDL HgbA1c: Lab Results  Component Value Date   HGBA1C 5.6 02/10/2015   Magnesium:  Magnesium  Date Value Ref Range Status  02/09/2015 2.1 1.7 - 2.4 mg/dL Final     Radiology/Studies:  DG Chest Port 1 View  Result Date: 08/16/2019 CLINICAL DATA:  Chest pain EXAM: PORTABLE CHEST 1 VIEW COMPARISON:  January 25, 2015 FINDINGS: The heart size and mediastinal contours are within normal limits. There is prominence of the central pulmonary vasculature. There is hyperinflation of the lung zones. No large airspace consolidation. The visualized skeletal structures are unremarkable. IMPRESSION: Mild pulmonary vascular congestion. Electronically Signed   By: Prudencio Pair M.D.   On: 08/16/2019 02:35    Assessment and Plan:   1.NSTEMI - EZ are trending up - no hx exertional sx, but he was wakened from sleep by L arm pain, ?anginal equivalent - advised him that he may need tx for cath, Dr Harl Bowie to make the final call. - continue ASA, add BB, ck lipids - will restart losartan for BP control, hold HCTZ since possible cath.  2. HTN - see above  3. HLD - prev stopped Zocor, thought it was related to neuropathy - will try different statin, Crestor 20 mg  Active Problems:   NSTEMI (non-ST elevated myocardial infarction) [facility])     For questions or updates, please contact [place] Please consult [email or web address] for contact info under Cardiology/STEMI.   Jonetta Speak, PA-C  08/16/2019 8:43 AM  Attending note Patient seen and  discussed with PA Barrett, I agree with her documentaiton above. 64 yo male history of prior PE on xarelto long term, CAD by incidental finding on CT scan. HL, notes mention off statin due to concenrs it was worsening his neuropathy. COPD, HTN. Presents with chest pain  Sudden onset of left arm pain around 1AM, awoke him from sleep. Throbbing like pain. Denied any SOB or diaphoresis,pain lasted about 9min and resolved on its own. No recurrent symptoms. Reports strict compliance with xarelto, last dose yesterday at 9AM   WBC 12.4 Plt 201 K 3.5 Cr 0.9 BNP 33  hstrop 10-->66-->82 COVID pending CXR mild congestion EKG sinus tach with inferior ST depressions that resolved on later tracings Prelim echo read LVE 65%, no WMAs  64 yo male with CAD risk factors and noted CAD as an incidental findings on prior chest CT presents with sudden left arm pain. Mild troponin but notiecable +delta, had initial ST depressions at higher heart rates that have since resolved. Prelim echo shows normal LVEF and no WMAs. Overall presentation concerning for NSTEMI, we will plan for cath.   Medical therapy with hep gtt, ASA for now while off xarelto, coreg, crestor 20. Consider ACEI after cath pending findings.  History of PE, hold xarelto at this time, start hep gtt.     I have reviewed the risks, indications, and alternatives  to cardiac catheterization, possible angioplasty, and stenting with the patient today. Risks include but are not limited to bleeding, infection, vascular injury, stroke, myocardial infection, arrhythmia, kidney injury, radiation-related injury in the case of prolonged fluoroscopy use, emergency cardiac surgery, and death. The patient understands the risks of serious complication is 1-2 in [id number] with diagnostic cardiac cath and 1-2% or less with angioplasty/stenting.   Carlyle Dolly MD

## 2019-08-16 NOTE — Interval H&P Note (Signed)
Cath Lab Visit (complete for each Cath Lab visit)  Clinical Evaluation Leading to the Procedure:   ACS: Yes.    Non-ACS:    Anginal Classification: CCS IV  Anti-ischemic medical therapy: Minimal Therapy (1 class of medications)  Non-Invasive Test Results: No non-invasive testing performed  Prior CABG: No previous CABG      History and Physical Interval Note:  08/16/2019 2:21 PM  Antonio Baker  has presented today for surgery, with the diagnosis of nonstemi.  The various methods of treatment have been discussed with the patient and family. After consideration of risks, benefits and other options for treatment, the patient has consented to  Procedure(s): LEFT HEART CATH AND CORONARY ANGIOGRAPHY (N/A) as a surgical intervention.  The patient's history has been reviewed, patient examined, no change in status, stable for surgery.  I have reviewed the patient's chart and labs.  Questions were answered to the patient's satisfaction.     Larae Grooms

## 2019-08-17 LAB — CBC
HCT: 41.6 % (ref 39.0–52.0)
Hemoglobin: 14.2 g/dL (ref 13.0–17.0)
MCH: 32.9 pg (ref 26.0–34.0)
MCHC: 34.1 g/dL (ref 30.0–36.0)
MCV: 96.5 fL (ref 80.0–100.0)
Platelets: 144 10*3/uL — ABNORMAL LOW (ref 150–400)
RBC: 4.31 MIL/uL (ref 4.22–5.81)
RDW: 12 % (ref 11.5–15.5)
WBC: 8.5 10*3/uL (ref 4.0–10.5)
nRBC: 0 % (ref 0.0–0.2)

## 2019-08-17 LAB — BASIC METABOLIC PANEL
Anion gap: 8 (ref 5–15)
BUN: 12 mg/dL (ref 8–23)
CO2: 24 mmol/L (ref 22–32)
Calcium: 8.9 mg/dL (ref 8.9–10.3)
Chloride: 106 mmol/L (ref 98–111)
Creatinine, Ser: 0.69 mg/dL (ref 0.61–1.24)
GFR calc Af Amer: >60 mL/min (ref >60)
GFR calc non Af Amer: >60 mL/min (ref >60)
Glucose, Bld: 110 mg/dL — ABNORMAL HIGH (ref 70–99)
Potassium: 4.1 mmol/L (ref 3.5–5.1)
Sodium: 138 mmol/L (ref 135–145)

## 2019-08-17 LAB — APTT: aPTT: 51 seconds — ABNORMAL HIGH (ref 24–36)

## 2019-08-17 MED ORDER — RIVAROXABAN 20 MG PO TABS
20.0000 mg | ORAL_TABLET | Freq: Every day | ORAL | Status: DC
  Administered 2019-08-17: 20 mg via ORAL
  Filled 2019-08-17: qty 1

## 2019-08-17 MED ORDER — ROSUVASTATIN CALCIUM 20 MG PO TABS: 20.0000 mg | ORAL_TABLET | Freq: Every day | ORAL | 6 refills | Status: DC

## 2019-08-17 MED ORDER — CARVEDILOL 6.25 MG PO TABS: 6.2500 mg | ORAL_TABLET | Freq: Two times a day (BID) | ORAL | 3 refills | Status: DC

## 2019-08-17 MED ORDER — NITROGLYCERIN 0.4 MG SL SUBL: 0.4000 mg | SUBLINGUAL_TABLET | SUBLINGUAL | 12 refills | Status: AC | PRN

## 2019-08-17 MED FILL — ROSUVASTATIN CALCIUM 20 MG: 20 | 30 days supply | Qty: 30 | Fill #0

## 2019-08-17 MED FILL — CARVEDILOL 6.25 MG TABLET: 6.25 | 30 days supply | Qty: 60 | Fill #0

## 2019-08-17 MED FILL — NITROGLYCERIN 0.4 MG TAB SL: 0.4 | 8 days supply | Qty: 25 | Fill #0

## 2019-08-17 NOTE — Progress Notes (Signed)
CARDIAC REHAB PHASE I   PRE:  Rate/Rhythm: 73 SR  BP:  Supine: 154/98  Sitting:   Standing:    SaO2: 99%RA  MODE:  Ambulation: 800 ft   POST:  Rate/Rhythm: 96 SR  BP:  Supine:   Sitting: 160/92  Standing:    SaO2: 98%RA 0810-0910 Pt walked 800 ft on RA with steady gait and no CP. Tolerated well. MI education completed with pt who voiced understanding. Reviewed NTG use, MI restrictions, heart healthy food choices and ex ed. Discussed CRP 2 and referred to [facility]. Pt not sure about attending but will consider. Pt likes to walk for ex.   Graylon Good, RN BSN  08/17/2019 9:06 AM

## 2019-08-17 NOTE — Discharge Instructions (Signed)

## 2019-08-17 NOTE — Discharge Summary (Addendum)
Discharge Summary    Patient ID: ZAM ENT MRN: [id number]; DOB: Mar 30, 1956  Admit date: 08/16/2019 Discharge date: 08/17/2019  Primary Care Provider: Joyice Faster, FNP  Primary Cardiologist: Rozann Lesches, MD   Discharge Diagnoses    Principal Problem:   NSTEMI (non-ST elevated myocardial infarction) [facility]) Active Problems:   Elevated troponin   Left arm pain   HTN   HLD  Diagnostic Studies/Procedures     Intravascular Ultrasound/IVUS 08/17/19  LEFT HEART CATH AND CORONARY ANGIOGRAPHY  Conclusion     Ost LM to Dist LM lesion is 25% stenosed. Left main area by IVUS 6.55 mm2.  Ost LAD lesion is 40% stenosed. Area by IVUS 6.1 mm2.  Mild to moderate diffuse coronary atherosclerosis.  The left ventricular systolic function is normal.  LV end diastolic pressure is normal.  The left ventricular ejection fraction is 55-65% by visual estimate.  There is no aortic valve stenosis.  Severe right subclavian tortuosity makes catheter manipulation difficult. If cath was needed in the future, would not use right radial approach.   Eccentric left main disease.  Not significant by IVUS.  He needs aggressive secondary prevention including lipid lowering therapy.     Restart Xarelto tomorrow if no bleeding issues.  Can restart heparin tonight 8 hours post sheath pull.     Diagnostic Dominance: Right    History of Present Illness     CHANCELOR Baker is a 64 y.o. male with a hx of HTN, HLD, coronary Ca++ on CT 2016, DVT/PE 2016, COPD, periph neuropathy, RLS, snoring w/out OSA transferred from [facility] for cath 2nd to NSTEMI.   Seen by Dr Domenic Polite because he was thought to have different BPs in each arm, but they were not significantly different. Normally does exercise without any issue.   Sudden onset of left arm pain around 1AM 08/16/19, awoke him from sleep. Throbbing like pain. Denied any SOB or diaphoresis,pain lasted about 63min and resolved on its own. He drove to the ER.  By arrival, the L arm pain was gone.  hstrop 10-->66-->82.  Overall presentation concerning for NSTEMI, we will plan for cath.   Hospital Course     Consultants: None  Started on heparin and sent to [facility] for cath showing eccentric left main disease.  Not significant by IVUS.  Severe right subclavian tortuosity makes catheter manipulation difficult. If cath was needed in the future, would not use right radial approach. Recommended aggressive medical therapy.  Patient reports neuropathy secondary to simvastatin previously.  Most recently patient had lab work by PCP. He is agree to start Crestor 20mg . He will restart his Xarelto this morning.  No bleeding complication.  No ASA due to need of anticoagulation. Coreg added this admission for high blood pressure.  Ambulated well.  Felt stable for discharge.   Did the patient have an acute coronary syndrome (MI, NSTEMI, STEMI, etc) this admission?:  Yes                               NO PCI.    _____________  Discharge Vitals Blood pressure (!) 154/98, pulse 74, temperature 97.8 F (36.6 C), temperature source Oral, resp. rate 18, height 6\' 2"  (1.88 m), weight 108.6 kg, SpO2 97 %.  Filed Weights   08/16/19 0140 08/16/19 1000  Weight: 106.6 kg 108.6 kg   Physical Exam  Constitutional: He is oriented to person, place, and time and well-developed, well-nourished, and in  no distress.  HENT:  Head: Normocephalic and atraumatic.  Eyes: Pupils are equal, round, and reactive to light.  Cardiovascular: Normal rate and regular rhythm.  Right radial cath site stable  Pulmonary/Chest: Effort normal and breath sounds normal.  Abdominal: Soft. Bowel sounds are normal.  Musculoskeletal:        General: Normal range of motion.     Cervical back: Normal range of motion and neck supple.  Neurological: He is alert and oriented to person, place, and time.  Skin: Skin is warm and dry.  Psychiatric: Affect normal.   Labs & Radiologic Studies    CBC Recent  Labs    08/16/19 0214 08/17/19 0817  WBC 12.4* 8.5  NEUTROABS 6.2  --   HGB 16.5 14.2  HCT 47.5 41.6  MCV 97.1 96.5  PLT 201 [id number]*   Basic Metabolic Panel Recent Labs    08/16/19 0214 08/17/19 0817  NA 132* 138  K 3.5 4.1  CL 96* 106  CO2 21* 24  GLUCOSE 133* 110*  BUN 22 12  CREATININE 0.90 0.69  CALCIUM 9.5 8.9   Liver Function Tests No results for input(s): AST, ALT, ALKPHOS, BILITOT, PROT, ALBUMIN in the last 72 hours. No results for input(s): LIPASE, AMYLASE in the last 72 hours. High Sensitivity Troponin:   Recent Labs  Lab 08/16/19 0214 08/16/19 0412 08/16/19 0736 08/16/19 1029  TROPONINIHS 10 66* 82* 65*   _____________  CARDIAC CATHETERIZATION  Addendum Date: 08/16/2019    Ost LM to Dist LM lesion is 25% stenosed. Left main area by IVUS 6.55 mm2.  Ost LAD lesion is 40% stenosed. Area by IVUS 6.1 mm2.  Mild to moderate diffuse coronary atherosclerosis.  The left ventricular systolic function is normal.  LV end diastolic pressure is normal.  The left ventricular ejection fraction is 55-65% by visual estimate.  There is no aortic valve stenosis.  Severe right subclavian tortuosity makes catheter manipulation difficult. If cath was needed in the future, would not use right radial approach.  Eccentric left main disease.  Not significant by IVUS.  He needs aggressive secondary prevention including lipid lowering therapy.  Restart Xarelto tomorrow if no bleeding issues.  Can restart heparin tonight 8 hours post sheath pull.   Result Date: 08/16/2019  Ost LM to Dist LM lesion is 25% stenosed. Left main area by IVUS 6.55 mm2.  The left ventricular systolic function is normal.  LV end diastolic pressure is normal.  The left ventricular ejection fraction is 55-65% by visual estimate.  There is no aortic valve stenosis.  Eccentric left main disease.  Not significant by IVUS.  He needs aggressive secondary prevention including lipid lowering therapy.  Restart Xarelto  tomorrow if no bleeding issues.  Can restart heparin tonight 8 hours post sheath pull.   DG Chest Port 1 View  Result Date: 08/16/2019 CLINICAL DATA:  Chest pain EXAM: PORTABLE CHEST 1 VIEW COMPARISON:  January 25, 2015 FINDINGS: The heart size and mediastinal contours are within normal limits. There is prominence of the central pulmonary vasculature. There is hyperinflation of the lung zones. No large airspace consolidation. The visualized skeletal structures are unremarkable. IMPRESSION: Mild pulmonary vascular congestion. Electronically Signed   By: Prudencio Pair M.D.   On: 08/16/2019 02:35   ECHOCARDIOGRAM COMPLETE  Result Date: 08/16/2019    ECHOCARDIOGRAM REPORT   Patient Name:   Antonio Baker Date of Exam: 08/16/2019 Medical Rec #:  [id number]    Height:  74.0 in Accession #:    [id number]   Weight:       235.0 lb Date of Birth:  Feb 25, 1956     BSA:          2.328 m Patient Age:    64 years     BP:           143/85 mmHg Patient Gender: M            HR:           94 bpm. Exam Location:  [facility] Procedure: 2D Echo Indications:    Acute Cornonary Syndrome I24.9  History:        Patient has prior history of Echocardiogram examinations, most                 recent 06/12/2012. Previous Myocardial Infarction, COPD; Risk                 Factors:Hypertension, Former Smoker and Dyslipidemia. GERD,                 Tachycardia.  Sonographer:    Leavy Cella RDCS (AE) Referring Phys: [phone] Royanne Foots [place]  1. Left ventricular ejection fraction, by estimation, is 60 to 65%. The left ventricle has normal function. The left ventricle has no regional wall motion abnormalities. There is mild left ventricular hypertrophy. Left ventricular diastolic parameters were normal.  2. Right ventricular systolic function is normal. The right ventricular size is normal.  3. The mitral valve is normal in structure. No evidence of mitral valve regurgitation. No evidence of mitral stenosis.  4. The aortic valve is  tricuspid. Aortic valve regurgitation is not visualized. Mild aortic valve stenosis.Aortic valve mean gradient measures 10 mmHg. Aortic valve peak gradient measures 19.4 mmHg. Aortic valve area, by VTI measures 1.84 cm. FINDINGS  Left Ventricle: Left ventricular ejection fraction, by estimation, is 60 to 65%. The left ventricle has normal function. The left ventricle has no regional wall motion abnormalities. The left ventricular internal cavity size was normal in size. There is  mild left ventricular hypertrophy. Left ventricular diastolic parameters were normal. Right Ventricle: The right ventricular size is normal. No increase in right ventricular wall thickness. Right ventricular systolic function is normal. Left Atrium: Left atrial size was normal in size. Right Atrium: Right atrial size was normal in size. Pericardium: There is no evidence of pericardial effusion. Mitral Valve: The mitral valve is normal in structure. No evidence of mitral valve regurgitation. No evidence of mitral valve stenosis. Tricuspid Valve: The tricuspid valve is normal in structure. Tricuspid valve regurgitation is not demonstrated. No evidence of tricuspid stenosis. Aortic Valve: The aortic valve is tricuspid. . There is moderate thickening and moderate calcification of the aortic valve. Aortic valve regurgitation is not visualized. Mild aortic stenosis is present. Moderate aortic valve annular calcification. There is moderate thickening of the aortic valve. There is moderate calcification of the aortic valve. Aortic valve mean gradient measures 9.7 mmHg. Aortic valve peak gradient measures 19.4 mmHg. Aortic valve area, by VTI measures 1.84 cm. Pulmonic Valve: The pulmonic valve was not well visualized. Pulmonic valve regurgitation is not visualized. No evidence of pulmonic stenosis. Aorta: The aortic root is normal in size and structure. Venous: The inferior vena cava was not well visualized. IAS/Shunts: The interatrial septum was  not well visualized.  LEFT VENTRICLE PLAX 2D LVIDd:         5.26 cm  Diastology LVIDs:  4.10 cm  LV e' lateral:   9.79 cm/s LV PW:         1.07 cm  LV E/e' lateral: 7.8 LV IVS:        1.10 cm  LV e' medial:    5.98 cm/s LVOT diam:     2.10 cm  LV E/e' medial:  12.8 LV SV:         69 LV SV Index:   30 LVOT Area:     3.46 cm  RIGHT VENTRICLE RV S prime:     15.10 cm/s TAPSE (M-mode): 1.7 cm LEFT ATRIUM             Index LA diam:        3.60 cm 1.55 cm/m LA Vol (A2C):   33.8 ml 14.52 ml/m LA Vol (A4C):   31.7 ml 13.62 ml/m LA Biplane Vol: 34.1 ml 14.65 ml/m  AORTIC VALVE AV Area (Vmax):    1.42 cm AV Area (Vmean):   1.53 cm AV Area (VTI):     1.84 cm AV Vmax:           220.36 cm/s AV Vmean:          141.911 cm/s AV VTI:            0.378 m AV Peak Grad:      19.4 mmHg AV Mean Grad:      9.7 mmHg LVOT Vmax:         90.52 cm/s LVOT Vmean:        62.649 cm/s LVOT VTI:          0.200 m LVOT/AV VTI ratio: 0.53  AORTA Ao Root diam: 2.80 cm MITRAL VALVE MV Area (PHT): 3.02 cm    SHUNTS MV Decel Time: 251 msec    Systemic VTI:  0.20 m MV E velocity: 76.70 cm/s  Systemic Diam: 2.10 cm MV A velocity: 66.80 cm/s MV E/A ratio:  1.15 Carlyle Dolly MD Electronically signed by Carlyle Dolly MD Signature Date/Time: 08/16/2019/11:10:09 AM    Final    Disposition   Pt is being discharged home today in good condition.  Follow-up Plans & Appointments    Follow-up Information    Satira Sark, MD. Go on 09/05/2019.   Specialty: Cardiology Why: @1pm  for hospital follow up  Contact information: [place] [address] [phone]          Discharge Instructions    Amb Referral to Cardiac Rehabilitation   Complete by: As directed    Diagnosis: NSTEMI   After initial evaluation and assessments completed: Virtual Based Care may be provided alone or in conjunction with Phase 2 Cardiac Rehab based on patient barriers.: Yes   Diet - low sodium heart healthy   Complete by: As  directed    Discharge instructions   Complete by: As directed    No driving for 48 hours. No lifting over 5 lbs for 1 week. No sexual activity for 1 week. Keep procedure site clean & dry. If you notice increased pain, swelling, bleeding or pus, call/return!  You may shower, but no soaking baths/hot tubs/pools for 1 week.   Increase activity slowly   Complete by: As directed       Discharge Medications   Allergies as of 08/17/2019   No Known Allergies     Medication List    TAKE these medications   acetaminophen 500 MG tablet Commonly known as: TYLENOL Take 1 tablet (500 mg total)  by mouth every 6 (six) hours as needed for mild pain.   albuterol 108 (90 Base) MCG/ACT inhaler Commonly known as: VENTOLIN HFA Inhale 2 puffs into the lungs 3 (three) times daily. And every 4 hours as needed for wheezing, chest congestion, or shortness of breath.   albuterol (5 MG/ML) 0.5% nebulizer solution Commonly known as: PROVENTIL Take 1 mL (5 mg total) by nebulization every 4 (four) hours as needed. For shortness of breath/wheezing   allopurinol 300 MG tablet Commonly known as: ZYLOPRIM Take 300 mg by mouth daily.   Anoro Ellipta 62.5-25 MCG/INH Aepb Generic drug: umeclidinium-vilanterol Inhale 1 puff into the lungs daily as needed.   APPLE CIDER VINEGAR PO Take 45 mLs by mouth daily.   B-12 PO Take 2,500 mg by mouth daily.   baclofen 20 MG tablet Commonly known as: LIORESAL Take 20 mg by mouth every 8 (eight) hours as needed.   carvedilol 6.25 MG tablet Commonly known as: COREG Take 1 tablet (6.25 mg total) by mouth 2 (two) times daily with a meal.   Fish Oil 1200 MG Caps Take 3 capsules by mouth 2 (two) times daily. 3 tabs each morning and 3 tabs each evening.   GARLIC PO Take 1 Scoop by mouth daily. Fresh garlic in smoothies   hydrochlorothiazide 25 MG tablet Commonly known as: HYDRODIURIL Take 25 mg by mouth daily.   ipratropium 0.02 % nebulizer solution Commonly  known as: ATROVENT Take 2.5 mLs (500 mcg total) by nebulization 4 (four) times daily.   losartan 100 MG tablet Commonly known as: COZAAR Take 100 mg by mouth daily.   nitroGLYCERIN 0.4 MG SL tablet Commonly known as: NITROSTAT Place 1 tablet (0.4 mg total) under the tongue every 5 (five) minutes as needed for chest pain (CP or SOB).   oxyCODONE-acetaminophen 5-325 MG tablet Commonly known as: Percocet Take 2 tablets by mouth every 6 (six) hours as needed for severe pain.   pyridoxine 100 MG tablet Commonly known as: B-6 Take 100 mg by mouth daily.   rosuvastatin 20 MG tablet Commonly known as: CRESTOR Take 1 tablet (20 mg total) by mouth daily at 6 PM.   valACYclovir 1000 MG tablet Commonly known as: VALTREX Take 1 tablet (1,000 mg total) by mouth 3 (three) times daily.   Xarelto 20 MG Tabs tablet Generic drug: rivaroxaban Take 20 mg by mouth every morning.          Outstanding Labs/Studies   None  Duration of Discharge Encounter   Greater than 30 minutes including physician time.  SignedCrista Luria [place], PA 08/17/2019, 10:01 AM  I have personally seen and examined this patient. I agree with the assessment and plan as outlined above.  Pt doing well this am. No chest pain. Cardiac cath with non-obstructive CAD. Resume Xarelto today. He refuses a statin. He is on an ASA. Discharge home today.   Bhavinkumar Bhagat\ 08/17/2019 10:01 AM

## 2019-08-21 DIAGNOSIS — R0902 Hypoxemia: Secondary | ICD-10-CM | POA: Diagnosis not present

## 2019-08-23 DIAGNOSIS — M79606 Pain in leg, unspecified: Secondary | ICD-10-CM | POA: Diagnosis not present

## 2019-08-23 DIAGNOSIS — G4700 Insomnia, unspecified: Secondary | ICD-10-CM | POA: Diagnosis not present

## 2019-08-23 DIAGNOSIS — G629 Polyneuropathy, unspecified: Secondary | ICD-10-CM | POA: Diagnosis not present

## 2019-09-05 ENCOUNTER — Encounter: Payer: Self-pay | Admitting: Cardiology

## 2019-09-05 ENCOUNTER — Other Ambulatory Visit: Payer: Self-pay

## 2019-09-05 ENCOUNTER — Ambulatory Visit: Payer: Medicare Other | Admitting: Cardiology

## 2019-09-05 VITALS — BP 140/78 | HR 88 | Temp 98.0°F | Ht 74.0 in | Wt 231.0 lb

## 2019-09-05 DIAGNOSIS — I2510 Atherosclerotic heart disease of native coronary artery without angina pectoris: Secondary | ICD-10-CM

## 2019-09-05 DIAGNOSIS — I350 Nonrheumatic aortic (valve) stenosis: Secondary | ICD-10-CM

## 2019-09-05 NOTE — Patient Instructions (Signed)
Medication Instructions:  Your physician recommends that you continue on your current medications as directed. Please refer to the Current Medication list given to you today.  *If you need a refill on your cardiac medications before your next appointment, please call your pharmacy*   Lab Work: Fasting Lipids JUST BEFORE next visit  If you have labs (blood work) drawn today and your tests are completely normal, you will receive your results only by: [name] MyChart Message (if you have MyChart) OR . A paper copy in the mail If you have any lab test that is abnormal or we need to change your treatment, we will call you to review the results.   Testing/Procedures: None today   Follow-Up: At [facility], you and your health needs are our priority.  As part of our continuing mission to provide you with exceptional heart care, we have created designated Provider Care Teams.  These Care Teams include your primary Cardiologist (physician) and Advanced Practice Providers (APPs -  Physician Assistants and Nurse Practitioners) who all work together to provide you with the care you need, when you need it.  We recommend signing up for the patient portal called "MyChart".  Sign up information is provided on this After Visit Summary.  MyChart is used to connect with patients for Virtual Visits (Telemedicine).  Patients are able to view lab/test results, encounter notes, upcoming appointments, etc.  Non-urgent messages can be sent to your provider as well.   To learn more about what you can do with MyChart, go to [email or web address].    Your next appointment:   3 month(s)  The format for your next appointment:   In Person  Provider:   You may see Rozann Lesches, MD or one of the following Advanced Practice Providers on your designated Care Team:    Bernerd Pho, PA-C        Other Instructions None       Thank you for choosing [facility]  !

## 2019-09-05 NOTE — Progress Notes (Signed)
Cardiology Office Note  Date: 09/05/2019   ID: Antonio Baker, DOB 11/10/1955, MRN [id number]  PCP:  Joyice Faster, FNP  Cardiologist:  Rozann Lesches, MD Electrophysiologist:  None   Chief Complaint  Patient presents with  . Hospitalization Follow-up    History of Present Illness: Antonio Baker is a 64 y.o. male presenting for post hospital follow-up.  I saw him in consultation on March 4 in referral for possible asymmetric arm blood pressures, without findings of significant difference on our assessment.  He did have evidence of coronary artery calcification by CT imaging done back in 2016 with diagnosis of previous pulmonary emboli.  He was on anticoagulation chronically with Xarelto and did not report any major functional limitation at that point, walking a mile a day.  Risk factor modification was recommended at that time.  He presented with sudden onset left arm pain on March 25, seen in the ER at [facility] and transferred to [facility] for cardiac catheterization with minimally elevated high-sensitivity troponin I up to 82.  Procedure was performed by Dr. Irish Lack and revealed only mild to moderate coronary atherosclerosis, eccentric within the left main but not significant by IVUS with recommendation to continue medical therapy.  There were no culprit stenoses to explain his symptoms.  Echocardiogram revealed normal LVEF at 60 to 65% without regional wall motion abnormalities.  Incidentally, he did have mild aortic stenosis documented.  He presents today for follow-up.  He does not report any chest pain or recurring left arm discomfort.  Did have an episode in the interim after lifting heavy object where he felt like his heart rate was elevated and he was short of breath for period of time.  States he feels like his "oxygen is low" at nighttime.  Reports a sleep study about 5 years ago and no interval diagnosis of OSA.  We discussed the results of his cardiac  catheterization and plan for medical therapy aimed at risk factor reduction.  He is tolerating Crestor, also continues on Coreg and losartan.  He has been trying to increase his walking for exercise and also lose some more weight.  Past Medical History:  Diagnosis Date  . Asthma   . Chronic pain   . COPD (chronic obstructive pulmonary disease) ([place])   . Coronary atherosclerosis    Mild to moderate by cardiac catheterization March 2021  . Degenerative disc disease, lumbar   . Essential hypertension   . Gout   . History of DVT (deep vein thrombosis)   . History of pulmonary embolus (PE) 2016  . Hyperlipidemia   . Peripheral neuropathic pain     Past Surgical History:  Procedure Laterality Date  . ESOPHAGOGASTRODUODENOSCOPY  06/12/2012   [id number] hiatal hernia/WH[place]QU[place] MOST LIKELY DUE TO CANDIDA ESOPHAGITIS/Single ulcer at the gastroesophageal junction-MOST LIKELY CAUSE FOR ODYNOPHAGIA/Stricture  at the gastroesophageal junction  . HERNIA REPAIR    . INTRAVASCULAR ULTRASOUND/IVUS N/A 08/16/2019   Procedure: Intravascular Ultrasound/IVUS;  Surgeon: Jettie Booze, MD;  Location: [place] [facility];  Service: Cardiovascular;  Laterality: N/A;  . LEFT HEART CATH AND CORONARY ANGIOGRAPHY N/A 08/16/2019   Procedure: LEFT HEART CATH AND CORONARY ANGIOGRAPHY;  Surgeon: Jettie Booze, MD;  Location: [place] [facility];  Service: Cardiovascular;  Laterality: N/A;  . Right knee arthroscopy      Current Outpatient Medications  Medication Sig Dispense Refill  . allopurinol (ZYLOPRIM) 300 MG tablet Take 300 mg by mouth daily.     [name]  APPLE CIDER VINEGAR PO Take 45 mLs by mouth daily.     . baclofen (LIORESAL) 20 MG tablet Take 20 mg by mouth every 8 (eight) hours as needed.    . carvedilol (COREG) 6.25 MG tablet Take 1 tablet (6.25 mg total) by mouth 2 (two) times daily with a meal. 60 tablet 3  . Cyanocobalamin (B-12 PO) Take 2,500 mg by mouth daily.     . hydrochlorothiazide  (HYDRODIURIL) 25 MG tablet Take 25 mg by mouth daily.    [name] losartan (COZAAR) 100 MG tablet Take 100 mg by mouth daily.    . nitroGLYCERIN (NITROSTAT) 0.4 MG SL tablet Place 1 tablet (0.4 mg total) under the tongue every 5 (five) minutes as needed for chest pain (CP or SOB). 25 tablet 12  . nortriptyline (PAMELOR) 10 MG capsule Take 20 mg by mouth daily.    . Omega-3 Fatty Acids (FISH OIL) 1200 MG CAPS Take 3 capsules by mouth 2 (two) times daily. 3 tabs each morning and 3 tabs each evening.    . pyridoxine (B-6) 100 MG tablet Take 100 mg by mouth daily.    . rosuvastatin (CRESTOR) 20 MG tablet Take 1 tablet (20 mg total) by mouth daily at 6 PM. 30 tablet 6  . traMADol (ULTRAM) 50 MG tablet Take 100 mg by mouth every 6 (six) hours.    Alveda Reasons 20 MG TABS tablet Take 20 mg by mouth every morning.      No current facility-administered medications for this visit.   Allergies:  Patient has no known allergies.   ROS:   Chronic back pain.  Physical Exam: VS:  BP 140/78   Pulse 88   Temp 98 F (36.7 C)   Ht 6\' 2"  (1.88 m)   Wt 231 lb (104.8 kg)   SpO2 97%   BMI 29.66 kg/m , BMI Body mass index is 29.66 kg/m.  Wt Readings from Last 3 Encounters:  09/05/19 231 lb (104.8 kg)  08/16/19 239 lb 6.7 oz (108.6 kg)  07/26/19 234 lb (106.1 kg)    General: Patient appears comfortable at rest. HEENT: Conjunctiva and lids normal, wearing a mask. Neck: Supple, no elevated JVP or carotid bruits, no thyromegaly. Lungs: Clear to auscultation, nonlabored breathing at rest. Cardiac: Regular rate and rhythm, no S3, 2/6 systolic murmur, no pericardial rub. Abdomen: Soft, nontender, bowel sounds present. Extremities: No pitting edema, distal pulses 2+.  ECG:  An ECG dated 08/17/2019 was personally reviewed today and demonstrated:  Normal sinus rhythm.  Recent Labwork: 08/16/2019: B Natriuretic Peptide 33.0 08/17/2019: BUN 12; Creatinine, Ser 0.69; Hemoglobin 14.2; Platelets 144; Potassium 4.1; Sodium  138   Other Studies Reviewed Today:  Cardiac catheterization 08/16/2019:  Ost LM to Dist LM lesion is 25% stenosed. Left main area by IVUS 6.55 mm2.  Ost LAD lesion is 40% stenosed. Area by IVUS 6.1 mm2.  Mild to moderate diffuse coronary atherosclerosis.  The left ventricular systolic function is normal.  LV end diastolic pressure is normal.  The left ventricular ejection fraction is 55-65% by visual estimate.  There is no aortic valve stenosis.  Severe right subclavian tortuosity makes catheter manipulation difficult. If cath was needed in the future, would not use right radial approach.   Eccentric left main disease.  Not significant by IVUS.  He needs aggressive secondary prevention including lipid lowering therapy.   Echocardiogram 08/16/2019: 1. Left ventricular ejection fraction, by estimation, is 60 to 65%. The  left ventricle has normal function. The  left ventricle has no regional  wall motion abnormalities. There is mild left ventricular hypertrophy.  Left ventricular diastolic parameters  were normal.  2. Right ventricular systolic function is normal. The right ventricular  size is normal.  3. The mitral valve is normal in structure. No evidence of mitral valve  regurgitation. No evidence of mitral stenosis.  4. The aortic valve is tricuspid. Aortic valve regurgitation is not  visualized. Mild aortic valve stenosis.Aortic valve mean gradient measures  10 mmHg. Aortic valve peak gradient measures 19.4 mmHg. Aortic valve area,  by VTI measures 1.84 cm.   Assessment and Plan:  1.  Mild to moderate nonobstructive CAD by recent cardiac catheterization.  No clear culprit for his presentation with left arm discomfort, and the high-sensitivity troponin I levels were not clearly in range for definitive ACS.  Agree with plans for medical therapy.  He is not on aspirin given use of Xarelto.  Continue Coreg, losartan, and Crestor.  2.  History of pulmonary emboli, on  chronic anticoagulation in the form of Xarelto per PCP.  3.  Continues on statin therapy following recent hospitalization in March, no intolerances to Crestor.  Check FLP and LFT prior to next visit.  4.  Essential hypertension, continue losartan, HCTZ, and Coreg.  5.  Mild calcific aortic stenosis, asymptomatic.  Noted by interval echocardiogram.  Medication Adjustments/Labs and Tests Ordered: Current medicines are reviewed at length with the patient today.  Concerns regarding medicines are outlined above.   Tests Ordered: Orders Placed This Encounter  Procedures  . Lipid Profile    Medication Changes: No orders of the defined types were placed in this encounter.   Disposition:  Follow up 3 months in the [place] office.  Signed, Satira Sark, MD, [facility] 09/05/2019 1:25 PM    [facility] at [facility] [address] [place], [place] [address] Phone: [phone]; Fax: [phone]

## 2019-09-21 DIAGNOSIS — R0902 Hypoxemia: Secondary | ICD-10-CM | POA: Diagnosis not present

## 2019-09-26 DIAGNOSIS — Z79899 Other long term (current) drug therapy: Secondary | ICD-10-CM | POA: Diagnosis not present

## 2019-10-04 DIAGNOSIS — M79606 Pain in leg, unspecified: Secondary | ICD-10-CM | POA: Diagnosis not present

## 2019-10-04 DIAGNOSIS — G629 Polyneuropathy, unspecified: Secondary | ICD-10-CM | POA: Diagnosis not present

## 2019-10-04 DIAGNOSIS — M545 Low back pain: Secondary | ICD-10-CM | POA: Diagnosis not present

## 2019-10-04 DIAGNOSIS — G4700 Insomnia, unspecified: Secondary | ICD-10-CM | POA: Diagnosis not present

## 2019-10-08 ENCOUNTER — Other Ambulatory Visit (HOSPITAL_COMMUNITY): Payer: Self-pay | Admitting: Neurology

## 2019-10-08 DIAGNOSIS — M545 Low back pain, unspecified: Secondary | ICD-10-CM

## 2019-10-21 DIAGNOSIS — R0902 Hypoxemia: Secondary | ICD-10-CM | POA: Diagnosis not present

## 2019-11-20 ENCOUNTER — Ambulatory Visit: Payer: PRIVATE HEALTH INSURANCE | Admitting: Cardiology

## 2019-11-21 DIAGNOSIS — R0902 Hypoxemia: Secondary | ICD-10-CM | POA: Diagnosis not present

## 2019-11-29 DIAGNOSIS — G629 Polyneuropathy, unspecified: Secondary | ICD-10-CM | POA: Diagnosis not present

## 2019-11-29 DIAGNOSIS — M79606 Pain in leg, unspecified: Secondary | ICD-10-CM | POA: Diagnosis not present

## 2019-11-29 DIAGNOSIS — G4700 Insomnia, unspecified: Secondary | ICD-10-CM | POA: Diagnosis not present

## 2019-11-29 DIAGNOSIS — G5603 Carpal tunnel syndrome, bilateral upper limbs: Secondary | ICD-10-CM | POA: Diagnosis not present

## 2019-11-29 DIAGNOSIS — M545 Low back pain: Secondary | ICD-10-CM | POA: Diagnosis not present

## 2019-12-08 ENCOUNTER — Other Ambulatory Visit: Payer: Self-pay | Admitting: Physician Assistant

## 2019-12-10 NOTE — Telephone Encounter (Signed)
This is a [place] pt, Dr. McDowell 

## 2019-12-17 ENCOUNTER — Ambulatory Visit (HOSPITAL_COMMUNITY)
Admission: RE | Admit: 2019-12-17 | Discharge: 2019-12-17 | Disposition: A | Discharge status: Home / Self Care | Payer: Medicare Other | Source: Ambulatory Visit | Attending: Neurology | Admitting: Neurology

## 2019-12-17 ENCOUNTER — Other Ambulatory Visit (HOSPITAL_COMMUNITY)
Admission: RE | Admit: 2019-12-17 | Discharge: 2019-12-17 | Disposition: A | Discharge status: Home / Self Care | Payer: Medicare Other | Source: Ambulatory Visit | Attending: Cardiology | Admitting: Cardiology

## 2019-12-17 ENCOUNTER — Other Ambulatory Visit: Payer: Self-pay

## 2019-12-17 DIAGNOSIS — M545 Low back pain, unspecified: Secondary | ICD-10-CM

## 2019-12-17 DIAGNOSIS — I2510 Atherosclerotic heart disease of native coronary artery without angina pectoris: Secondary | ICD-10-CM | POA: Insufficient documentation

## 2019-12-17 LAB — LIPID PANEL
Cholesterol: 142 mg/dL (ref 0–200)
HDL: 42 mg/dL (ref >40)
LDL Cholesterol: 77 mg/dL (ref 0–99)
Total CHOL/HDL Ratio: 3.4 RATIO
Triglycerides: 117 mg/dL (ref <150)
VLDL: 23 mg/dL (ref 0–40)

## 2019-12-21 ENCOUNTER — Other Ambulatory Visit: Payer: Self-pay

## 2019-12-21 ENCOUNTER — Encounter: Payer: Self-pay | Admitting: Cardiology

## 2019-12-21 ENCOUNTER — Ambulatory Visit: Payer: Medicare Other | Admitting: Cardiology

## 2019-12-21 VITALS — BP 146/72 | HR 80 | Ht 74.0 in | Wt 223.0 lb

## 2019-12-21 DIAGNOSIS — E782 Mixed hyperlipidemia: Secondary | ICD-10-CM | POA: Diagnosis not present

## 2019-12-21 DIAGNOSIS — I350 Nonrheumatic aortic (valve) stenosis: Secondary | ICD-10-CM | POA: Diagnosis not present

## 2019-12-21 DIAGNOSIS — I2510 Atherosclerotic heart disease of native coronary artery without angina pectoris: Secondary | ICD-10-CM | POA: Diagnosis not present

## 2019-12-21 DIAGNOSIS — R0902 Hypoxemia: Secondary | ICD-10-CM | POA: Diagnosis not present

## 2019-12-21 NOTE — Patient Instructions (Signed)
Medication Instructions:  °Your physician recommends that you continue on your current medications as directed. Please refer to the Current Medication list given to you today. ° °*If you need a refill on your cardiac medications before your next appointment, please call your pharmacy* ° ° °Lab Work: °None today °If you have labs (blood work) drawn today and your tests are completely normal, you will receive your results only by: °• MyChart Message (if you have MyChart) OR °• A paper copy in the mail °If you have any lab test that is abnormal or we need to change your treatment, we will call you to review the results. ° ° °Testing/Procedures: °None today ° ° °Follow-Up: °At [facility], you and your health needs are our priority.  As part of our continuing mission to provide you with exceptional heart care, we have created designated Provider Care Teams.  These Care Teams include your primary Cardiologist (physician) and Advanced Practice Providers (APPs -  Physician Assistants and Nurse Practitioners) who all work together to provide you with the care you need, when you need it. ° °We recommend signing up for the patient portal called "MyChart".  Sign up information is provided on this After Visit Summary.  MyChart is used to connect with patients for Virtual Visits (Telemedicine).  Patients are able to view lab/test results, encounter notes, upcoming appointments, etc.  Non-urgent messages can be sent to your provider as well.   °To learn more about what you can do with MyChart, go to [email or web address].   ° °Your next appointment:   °6 month(s) ° °The format for your next appointment:   °In Person ° °Provider:   °Samuel McDowell, MD ° ° °Other Instructions °None ° ° ° ° °Thank you for choosing [facility] ! ° ° ° ° ° ° ° ° °

## 2019-12-21 NOTE — Progress Notes (Signed)
Cardiology Office Note  Date: 12/21/2019   ID: Mani, Celestin 04/02/1956, MRN [id number]  PCP:  Joyice Faster, FNP  Cardiologist:  Rozann Lesches, MD Electrophysiologist:  None   Chief Complaint  Patient presents with  . Cardiac follow-up    History of Present Illness: Antonio Baker is a 64 y.o. male last seen in April.  He presents for a routine visit.  He tells me that he has been working in his garden, also walking for exercise.  He does not report any active angina at this time or breathlessness beyond NYHA class II.  Also watching his diet more carefully and has lost about 8 pounds.  Recent lipid panel is noted below.  LDL 77, tolerating Crestor.  We went over his medications which are stable and listed below.  He has not had a recent follow-up with his PCP.  Past Medical History:  Diagnosis Date  . Asthma   . Chronic pain   . COPD (chronic obstructive pulmonary disease) ([place])   . Coronary atherosclerosis    Mild to moderate by cardiac catheterization March 2021  . Degenerative disc disease, lumbar   . Essential hypertension   . Gout   . History of DVT (deep vein thrombosis)   . History of pulmonary embolus (PE) 2016  . Hyperlipidemia   . Peripheral neuropathic pain     Past Surgical History:  Procedure Laterality Date  . ESOPHAGOGASTRODUODENOSCOPY  06/12/2012   GBT:DVVOH hiatal hernia/[place] MOST LIKELY DUE TO CANDIDA ESOPHAGITIS/Single ulcer at the gastroesophageal junction-MOST LIKELY CAUSE FOR ODYNOPHAGIA/Stricture  at the gastroesophageal junction  . HERNIA REPAIR    . INTRAVASCULAR ULTRASOUND/IVUS N/A 08/16/2019   Procedure: Intravascular Ultrasound/IVUS;  Surgeon: Jettie Booze, MD;  Location: [place] [facility];  Service: Cardiovascular;  Laterality: N/A;  . LEFT HEART CATH AND CORONARY ANGIOGRAPHY N/A 08/16/2019   Procedure: LEFT HEART CATH AND CORONARY ANGIOGRAPHY;  Surgeon: Jettie Booze, MD;  Location: [place] [facility];   Service: Cardiovascular;  Laterality: N/A;  . Right knee arthroscopy      Current Outpatient Medications  Medication Sig Dispense Refill  . allopurinol (ZYLOPRIM) 300 MG tablet Take 300 mg by mouth daily.     . APPLE CIDER VINEGAR PO Take 45 mLs by mouth daily.     . baclofen (LIORESAL) 20 MG tablet Take 20 mg by mouth every 8 (eight) hours as needed.    . carvedilol (COREG) 6.25 MG tablet TAKE 1 TABLET BY MOUTH TWICE A DAY WITH MEAL 180 tablet 3  . Cyanocobalamin (B-12 PO) Take 2,500 mg by mouth daily.     . hydrochlorothiazide (HYDRODIURIL) 25 MG tablet Take 25 mg by mouth daily.    [name] losartan (COZAAR) 100 MG tablet Take 100 mg by mouth daily.    . nitroGLYCERIN (NITROSTAT) 0.4 MG SL tablet Place 1 tablet (0.4 mg total) under the tongue every 5 (five) minutes as needed for chest pain (CP or SOB). 25 tablet 12  . nortriptyline (PAMELOR) 10 MG capsule Take 20 mg by mouth daily.    . Omega-3 Fatty Acids (FISH OIL) 1200 MG CAPS Take 3 capsules by mouth 2 (two) times daily. 3 tabs each morning and 3 tabs each evening.    . pyridoxine (B-6) 100 MG tablet Take 100 mg by mouth daily.    . rosuvastatin (CRESTOR) 20 MG tablet Take 1 tablet (20 mg total) by mouth daily at 6 PM. 30 tablet 6  . traMADol (  ULTRAM) 50 MG tablet Take 100 mg by mouth every 6 (six) hours.    Alveda Reasons 20 MG TABS tablet Take 20 mg by mouth every morning.      No current facility-administered medications for this visit.   Allergies:  Patient has no known allergies.   ROS:   No palpitations or syncope.  Physical Exam: VS:  BP (!) 146/72   Pulse 80   Ht 6\' 2"  (1.88 m)   Wt (!) 223 lb (101.2 kg)   SpO2 98%   BMI 28.63 kg/m , BMI Body mass index is 28.63 kg/m.  Wt Readings from Last 3 Encounters:  12/21/19 (!) 223 lb (101.2 kg)  09/05/19 231 lb (104.8 kg)  08/16/19 239 lb 6.7 oz (108.6 kg)    General: Patient appears comfortable at rest. HEENT: Conjunctiva and lids normal, wearing a mask. Neck: Supple, no  elevated JVP or carotid bruits, no thyromegaly. Lungs: Clear to auscultation, nonlabored breathing at rest. Cardiac: Regular rate and rhythm, no S3, 2/6 systolic murmur, no pericardial rub. Extremities: No pitting edema, distal pulses 2+.  ECG:  An ECG dated 08/17/2019 was personally reviewed today and demonstrated:  Normal sinus rhythm.  Recent Labwork: 08/16/2019: B Natriuretic Peptide 33.0 08/17/2019: BUN 12; Creatinine, Ser 0.69; Hemoglobin 14.2; Platelets 144; Potassium 4.1; Sodium 138     Component Value Date/Time   CHOL 142 12/17/2019 1032   TRIG 117 12/17/2019 1032   HDL 42 12/17/2019 1032   CHOLHDL 3.4 12/17/2019 1032   VLDL 23 12/17/2019 1032   LDLCALC 77 12/17/2019 1032    Other Studies Reviewed Today:  Cardiac catheterization 08/16/2019:  Ost LM to Dist LM lesion is 25% stenosed. Left main area by IVUS 6.55 mm2.  Ost LAD lesion is 40% stenosed. Area by IVUS 6.1 mm2.  Mild to moderate diffuse coronary atherosclerosis.  The left ventricular systolic function is normal.  LV end diastolic pressure is normal.  The left ventricular ejection fraction is 55-65% by visual estimate.  There is no aortic valve stenosis.  Severe right subclavian tortuosity makes catheter manipulation difficult. If cath was needed in the future, would not use right radial approach.  Eccentric left main disease. Not significant by IVUS. He needs aggressive secondary prevention including lipid lowering therapy.   Echocardiogram 08/16/2019: 1. Left ventricular ejection fraction, by estimation, is 60 to 65%. The  left ventricle has normal function. The left ventricle has no regional  wall motion abnormalities. There is mild left ventricular hypertrophy.  Left ventricular diastolic parameters  were normal.  2. Right ventricular systolic function is normal. The right ventricular  size is normal.  3. The mitral valve is normal in structure. No evidence of mitral valve  regurgitation. No  evidence of mitral stenosis.  4. The aortic valve is tricuspid. Aortic valve regurgitation is not  visualized. Mild aortic valve stenosis.Aortic valve mean gradient measures  10 mmHg. Aortic valve peak gradient measures 19.4 mmHg. Aortic valve area,  by VTI measures 1.84 cm.   Assessment and Plan:  1.  Coronary atherosclerosis, mild to moderate by cardiac catheterization in March.  Plan to continue medical therapy and observation.  He is not on aspirin given concurrent use of Xarelto.  Continue Coreg, losartan, and Crestor.  Continue walking plan.  2.  Mixed hyperlipidemia, tolerating Crestor with recent LDL 77.  3.  History of pulmonary emboli, he remains on chronic anticoagulation and is tolerating Xarelto.  This is followed by his PCP.  4.  Asymptomatic, mild aortic  stenosis.  No significant change on examination.  Medication Adjustments/Labs and Tests Ordered: Current medicines are reviewed at length with the patient today.  Concerns regarding medicines are outlined above.   Tests Ordered: No orders of the defined types were placed in this encounter.   Medication Changes: No orders of the defined types were placed in this encounter.   Disposition:  Follow up 6 months in the [place] office.  Signed, Satira Sark, MD, [facility] 12/21/2019 2:51 PM    [facility] at [facility] [address] [place], [place] [address] Phone: [phone]; Fax: [phone]

## 2020-01-21 DIAGNOSIS — R0902 Hypoxemia: Secondary | ICD-10-CM | POA: Diagnosis not present

## 2020-02-21 DIAGNOSIS — R0902 Hypoxemia: Secondary | ICD-10-CM | POA: Diagnosis not present

## 2020-02-21 DIAGNOSIS — M545 Low back pain: Secondary | ICD-10-CM | POA: Diagnosis not present

## 2020-02-21 DIAGNOSIS — G5603 Carpal tunnel syndrome, bilateral upper limbs: Secondary | ICD-10-CM | POA: Diagnosis not present

## 2020-02-21 DIAGNOSIS — G4700 Insomnia, unspecified: Secondary | ICD-10-CM | POA: Diagnosis not present

## 2020-02-21 DIAGNOSIS — M79606 Pain in leg, unspecified: Secondary | ICD-10-CM | POA: Diagnosis not present

## 2020-03-22 DIAGNOSIS — R0902 Hypoxemia: Secondary | ICD-10-CM | POA: Diagnosis not present

## 2020-04-22 DIAGNOSIS — R0902 Hypoxemia: Secondary | ICD-10-CM | POA: Diagnosis not present

## 2020-05-08 DIAGNOSIS — J449 Chronic obstructive pulmonary disease, unspecified: Secondary | ICD-10-CM | POA: Diagnosis not present

## 2020-05-08 DIAGNOSIS — E782 Mixed hyperlipidemia: Secondary | ICD-10-CM | POA: Diagnosis not present

## 2020-05-08 DIAGNOSIS — I10 Essential (primary) hypertension: Secondary | ICD-10-CM | POA: Diagnosis not present

## 2020-05-08 DIAGNOSIS — G6289 Other specified polyneuropathies: Secondary | ICD-10-CM | POA: Diagnosis not present

## 2020-05-08 DIAGNOSIS — M1A09X0 Idiopathic chronic gout, multiple sites, without tophus (tophi): Secondary | ICD-10-CM | POA: Diagnosis not present

## 2020-05-14 DIAGNOSIS — M545 Low back pain, unspecified: Secondary | ICD-10-CM | POA: Diagnosis not present

## 2020-05-14 DIAGNOSIS — M79606 Pain in leg, unspecified: Secondary | ICD-10-CM | POA: Diagnosis not present

## 2020-05-14 DIAGNOSIS — G629 Polyneuropathy, unspecified: Secondary | ICD-10-CM | POA: Diagnosis not present

## 2020-05-14 DIAGNOSIS — G5603 Carpal tunnel syndrome, bilateral upper limbs: Secondary | ICD-10-CM | POA: Diagnosis not present

## 2020-05-22 DIAGNOSIS — R0902 Hypoxemia: Secondary | ICD-10-CM | POA: Diagnosis not present

## 2020-06-22 DIAGNOSIS — R0902 Hypoxemia: Secondary | ICD-10-CM | POA: Diagnosis not present

## 2020-06-25 ENCOUNTER — Other Ambulatory Visit: Payer: Self-pay

## 2020-06-25 ENCOUNTER — Ambulatory Visit: Payer: Medicare Other | Admitting: Cardiology

## 2020-06-25 ENCOUNTER — Encounter: Payer: Self-pay | Admitting: Cardiology

## 2020-06-25 VITALS — BP 128/76 | HR 78 | Ht 74.0 in | Wt 222.0 lb

## 2020-06-25 DIAGNOSIS — I2510 Atherosclerotic heart disease of native coronary artery without angina pectoris: Secondary | ICD-10-CM

## 2020-06-25 DIAGNOSIS — I350 Nonrheumatic aortic (valve) stenosis: Secondary | ICD-10-CM

## 2020-06-25 DIAGNOSIS — Z86711 Personal history of pulmonary embolism: Secondary | ICD-10-CM

## 2020-06-25 DIAGNOSIS — E782 Mixed hyperlipidemia: Secondary | ICD-10-CM | POA: Diagnosis not present

## 2020-06-25 NOTE — Patient Instructions (Signed)
Medication Instructions:  Your physician recommends that you continue on your current medications as directed. Please refer to the Current Medication list given to you today.  *If you need a refill on your cardiac medications before your next appointment, please call your pharmacy*   Lab Work: None today If you have labs (blood work) drawn today and your tests are completely normal, you will receive your results only by: . MyChart Message (if you have MyChart) OR . A paper copy in the mail If you have any lab test that is abnormal or we need to change your treatment, we will call you to review the results.   Testing/Procedures: None today   Follow-Up: At [facility], you and your health needs are our priority.  As part of our continuing mission to provide you with exceptional heart care, we have created designated Provider Care Teams.  These Care Teams include your primary Cardiologist (physician) and Advanced Practice Providers (APPs -  Physician Assistants and Nurse Practitioners) who all work together to provide you with the care you need, when you need it.  We recommend signing up for the patient portal called "MyChart".  Sign up information is provided on this After Visit Summary.  MyChart is used to connect with patients for Virtual Visits (Telemedicine).  Patients are able to view lab/test results, encounter notes, upcoming appointments, etc.  Non-urgent messages can be sent to your provider as well.   To learn more about what you can do with MyChart, go to [email or web address].    Your next appointment:   12 month(s)  The format for your next appointment:   In Person  Provider:   Samuel McDowell, MD   Other Instructions None       Thank you for choosing  [facility] !         

## 2020-06-25 NOTE — Progress Notes (Signed)
Cardiology Office Note  Date: 06/25/2020   ID: Antonio Baker, Antonio Baker [birth year], MRN [id number]  PCP:  Joyice Faster, FNP (Inactive)  Cardiologist:  Rozann Lesches, MD Electrophysiologist:  None   Chief Complaint  Patient presents with  . Cardiac follow-up    History of Present Illness: TABB Antonio Baker is a 65 y.o. male last seen in July 2021.  He presents for a routine visit.  Reports no angina symptoms at this time, no palpitations or unusual shortness of breath.  He continues to follow at [facility].  Reports having blood work about 1 month ago which we will request for review.  He has been on Xarelto long-term, started on anticoagulation back in 2016 when he was diagnosed with acute left lower extremity DVT and a right sided pulmonary embolus.  This was not clearly provoked, although he states that he was very sedentary at that time.  Discharge summary does not elucidate long-term anticoagulation or a hypercoagulability work-up.  We have not specifically been following this, it has been by his PCP.  He did ask about duration and I told him that I could refer him to Hematology for hypercoagulability work-up if he would like.  I personally reviewed his ECG today which shows normal sinus rhythm.  Past Medical History:  Diagnosis Date  . Asthma   . Chronic pain   . COPD (chronic obstructive pulmonary disease) ([place])   . Coronary atherosclerosis    Mild to moderate by cardiac catheterization March 2021  . Degenerative disc disease, lumbar   . Essential hypertension   . Gout   . History of DVT (deep vein thrombosis)   . History of pulmonary embolus (PE) 2016  . Hyperlipidemia   . Peripheral neuropathic pain     Past Surgical History:  Procedure Laterality Date  . ESOPHAGOGASTRODUODENOSCOPY  06/12/2012   DJM:EQAST hiatal hernia/WHITE PLAQUES MOST LIKELY DUE TO CANDIDA ESOPHAGITIS/Single ulcer at the gastroesophageal junction-MOST LIKELY CAUSE FOR  ODYNOPHAGIA/Stricture  at the gastroesophageal junction  . HERNIA REPAIR    . INTRAVASCULAR ULTRASOUND/IVUS N/A 08/16/2019   Procedure: Intravascular Ultrasound/IVUS;  Surgeon: Jettie Booze, MD;  Location: [place] [facility];  Service: Cardiovascular;  Laterality: N/A;  . LEFT HEART CATH AND CORONARY ANGIOGRAPHY N/A 08/16/2019   Procedure: LEFT HEART CATH AND CORONARY ANGIOGRAPHY;  Surgeon: Jettie Booze, MD;  Location: [place] [facility];  Service: Cardiovascular;  Laterality: N/A;  . Right knee arthroscopy      Current Outpatient Medications  Medication Sig Dispense Refill  . allopurinol (ZYLOPRIM) 300 MG tablet Take 300 mg by mouth daily.     . APPLE CIDER VINEGAR PO Take 45 mLs by mouth daily.     . carvedilol (COREG) 6.25 MG tablet TAKE 1 TABLET BY MOUTH TWICE A DAY WITH MEAL 180 tablet 3  . Cyanocobalamin (B-12 PO) Take 2,500 mg by mouth daily.     . nitroGLYCERIN (NITROSTAT) 0.4 MG SL tablet Place 1 tablet (0.4 mg total) under the tongue every 5 (five) minutes as needed for chest pain (CP or SOB). 25 tablet 12  . Omega-3 Fatty Acids (FISH OIL) 1200 MG CAPS Take 3 capsules by mouth 2 (two) times daily. 3 tabs each morning and 3 tabs each evening.    . pyridoxine (B-6) 100 MG tablet Take 100 mg by mouth daily.    . rosuvastatin (CRESTOR) 20 MG tablet Take 1 tablet (20 mg total) by mouth daily at 6 PM. 30 tablet  6  . traMADol (ULTRAM) 50 MG tablet Take 100 mg by mouth every 6 (six) hours.    [name] umeclidinium-vilanterol (ANORO ELLIPTA) 62.5-25 MCG/INH AEPB 1 puff    . XARELTO 20 MG TABS tablet Take 20 mg by mouth every morning.      No current facility-administered medications for this visit.   Allergies:  Patient has no known allergies.   ROS: Chronic neuropathy.  Physical Exam: VS:  BP 128/76   Pulse 78   Ht 6\' 2"  (1.88 m)   Wt 222 lb (100.7 kg)   SpO2 98%   BMI 28.50 kg/m , BMI Body mass index is 28.5 kg/m.  Wt Readings from Last 3 Encounters:  06/25/20 222 lb  (100.7 kg)  12/21/19 (!) 223 lb (101.2 kg)  09/05/19 231 lb (104.8 kg)    General: Patient appears comfortable at rest. HEENT: Conjunctiva and lids normal, wearing a mask. Neck: Supple, no elevated JVP or carotid bruits, no thyromegaly. Lungs: Clear to auscultation, nonlabored breathing at rest. Cardiac: Regular rate and rhythm, no S3, 2/6 basal systolic murmur, no pericardial rub. Extremities: No pitting edema.  ECG:  An ECG dated 08/17/2019 was personally reviewed today and demonstrated:  Normal sinus rhythm.  Recent Labwork: 08/16/2019: B Natriuretic Peptide 33.0 08/17/2019: BUN 12; Creatinine, Ser 0.69; Hemoglobin 14.2; Platelets 144; Potassium 4.1; Sodium 138     Component Value Date/Time   CHOL 142 12/17/2019 1032   TRIG 117 12/17/2019 1032   HDL 42 12/17/2019 1032   CHOLHDL 3.4 12/17/2019 1032   VLDL 23 12/17/2019 1032   LDLCALC 77 12/17/2019 1032    Other Studies Reviewed Today:  Cardiac catheterization 08/16/2019:  Ost LM to Dist LM lesion is 25% stenosed. Left main area by IVUS 6.55 mm2.  Ost LAD lesion is 40% stenosed. Area by IVUS 6.1 mm2.  Mild to moderate diffuse coronary atherosclerosis.  The left ventricular systolic function is normal.  LV end diastolic pressure is normal.  The left ventricular ejection fraction is 55-65% by visual estimate.  There is no aortic valve stenosis.  Severe right subclavian tortuosity makes catheter manipulation difficult. If cath was needed in the future, would not use right radial approach.  Eccentric left main disease. Not significant by IVUS. He needs aggressive secondary prevention including lipid lowering therapy.  Echocardiogram 08/16/2019: 1. Left ventricular ejection fraction, by estimation, is 60 to 65%. The  left ventricle has normal function. The left ventricle has no regional  wall motion abnormalities. There is mild left ventricular hypertrophy.  Left ventricular diastolic parameters  were normal.  2.  Right ventricular systolic function is normal. The right ventricular  size is normal.  3. The mitral valve is normal in structure. No evidence of mitral valve  regurgitation. No evidence of mitral stenosis.  4. The aortic valve is tricuspid. Aortic valve regurgitation is not  visualized. Mild aortic valve stenosis.Aortic valve mean gradient measures  10 mmHg. Aortic valve peak gradient measures 19.4 mmHg. Aortic valve area,  by VTI measures 1.84 cm.   Assessment and Plan:  1.  Mild to moderate nonobstructive CAD by cardiac catheterization in March 2021.  He reports no active angina symptoms at this time and we continue with observation.  ECG is normal today.  He continues on Crestor.  2.  History of left lower extremity DVT and right-sided pulmonary embolus in 2016, sedentary at that time but otherwise unprovoked.  He has been on anticoagulation per PCP since then, asked about duration today.  It  is not clear to me that he had a hypercoagulability work-up and the discharge summary from the hospitalist team does not indicate plans for duration.  I have explained that I can refer him to Hematology for a formal hypercoagulability screen and he states that he will consider this.  3.  Mild aortic stenosis, no change in heart murmur.  4.  Mixed hyperlipidemia, on Crestor.  Requesting lab work from PCP.  Medication Adjustments/Labs and Tests Ordered: Current medicines are reviewed at length with the patient today.  Concerns regarding medicines are outlined above.   Tests Ordered: Orders Placed This Encounter  Procedures  . EKG 12-Lead    Medication Changes: No orders of the defined types were placed in this encounter.   Disposition:  Follow up 1 year in the [place] office.  Signed, Satira Sark, MD, [facility] 06/25/2020 1:26 PM    [place] at [facility] [address], [place], [place] [address] Phone: [phone]; Fax: [phone]

## 2020-06-26 ENCOUNTER — Encounter: Payer: Self-pay | Admitting: Cardiology

## 2020-07-21 DIAGNOSIS — R0902 Hypoxemia: Secondary | ICD-10-CM | POA: Diagnosis not present

## 2020-08-19 DIAGNOSIS — D485 Neoplasm of uncertain behavior of skin: Secondary | ICD-10-CM | POA: Diagnosis not present

## 2020-08-19 DIAGNOSIS — C44729 Squamous cell carcinoma of skin of left lower limb, including hip: Secondary | ICD-10-CM | POA: Diagnosis not present

## 2020-08-19 DIAGNOSIS — D234 Other benign neoplasm of skin of scalp and neck: Secondary | ICD-10-CM | POA: Diagnosis not present

## 2020-08-19 DIAGNOSIS — D2339 Other benign neoplasm of skin of other parts of face: Secondary | ICD-10-CM | POA: Diagnosis not present

## 2020-08-19 DIAGNOSIS — D2322 Other benign neoplasm of skin of left ear and external auricular canal: Secondary | ICD-10-CM | POA: Diagnosis not present

## 2020-08-19 DIAGNOSIS — Z719 Counseling, unspecified: Secondary | ICD-10-CM | POA: Diagnosis not present

## 2020-08-19 DIAGNOSIS — D2321 Other benign neoplasm of skin of right ear and external auricular canal: Secondary | ICD-10-CM | POA: Diagnosis not present

## 2020-08-19 DIAGNOSIS — D23121 Other benign neoplasm of skin of left upper eyelid, including canthus: Secondary | ICD-10-CM | POA: Diagnosis not present

## 2020-08-19 DIAGNOSIS — D23111 Other benign neoplasm of skin of right upper eyelid, including canthus: Secondary | ICD-10-CM | POA: Diagnosis not present

## 2020-08-19 DIAGNOSIS — D230 Other benign neoplasm of skin of lip: Secondary | ICD-10-CM | POA: Diagnosis not present

## 2020-08-19 DIAGNOSIS — D2361 Other benign neoplasm of skin of right upper limb, including shoulder: Secondary | ICD-10-CM | POA: Diagnosis not present

## 2020-08-19 DIAGNOSIS — Z809 Family history of malignant neoplasm, unspecified: Secondary | ICD-10-CM | POA: Diagnosis not present

## 2020-08-19 DIAGNOSIS — Z1283 Encounter for screening for malignant neoplasm of skin: Secondary | ICD-10-CM | POA: Diagnosis not present

## 2020-08-20 DIAGNOSIS — R0902 Hypoxemia: Secondary | ICD-10-CM | POA: Diagnosis not present

## 2020-08-31 ENCOUNTER — Other Ambulatory Visit: Payer: Self-pay | Admitting: Physician Assistant

## 2020-09-20 DIAGNOSIS — R0902 Hypoxemia: Secondary | ICD-10-CM | POA: Diagnosis not present

## 2020-10-20 DIAGNOSIS — R0902 Hypoxemia: Secondary | ICD-10-CM | POA: Diagnosis not present

## 2020-11-13 DIAGNOSIS — E782 Mixed hyperlipidemia: Secondary | ICD-10-CM | POA: Diagnosis not present

## 2020-11-13 DIAGNOSIS — I10 Essential (primary) hypertension: Secondary | ICD-10-CM | POA: Diagnosis not present

## 2020-11-13 DIAGNOSIS — J449 Chronic obstructive pulmonary disease, unspecified: Secondary | ICD-10-CM | POA: Diagnosis not present

## 2020-11-13 DIAGNOSIS — G894 Chronic pain syndrome: Secondary | ICD-10-CM | POA: Diagnosis not present

## 2020-11-13 DIAGNOSIS — G6289 Other specified polyneuropathies: Secondary | ICD-10-CM | POA: Diagnosis not present

## 2020-11-13 DIAGNOSIS — M1A09X0 Idiopathic chronic gout, multiple sites, without tophus (tophi): Secondary | ICD-10-CM | POA: Diagnosis not present

## 2020-11-20 DIAGNOSIS — R0902 Hypoxemia: Secondary | ICD-10-CM | POA: Diagnosis not present

## 2020-11-29 ENCOUNTER — Other Ambulatory Visit: Payer: Self-pay | Admitting: Cardiology

## 2020-12-20 DIAGNOSIS — R0902 Hypoxemia: Secondary | ICD-10-CM | POA: Diagnosis not present

## 2021-01-20 DIAGNOSIS — R0902 Hypoxemia: Secondary | ICD-10-CM | POA: Diagnosis not present

## 2021-01-21 ENCOUNTER — Telehealth: Payer: Self-pay | Admitting: Cardiology

## 2021-01-21 NOTE — Telephone Encounter (Signed)
Antonio Baker agrees to speak with his PCP regarding Xarelto since we did not prescribe it to him.

## 2021-01-21 NOTE — Telephone Encounter (Signed)
Pt called requesting an apt to see Dr. Domenic Polite because he can no longer afford his XARELTO 20 MG TABS tablet [id number] he has been without this medication for over a month.   He's wanting to speak w/ Dr. Domenic Polite about his rosuvastatin (CRESTOR) 20 MG tablet [id number]   Please give pt a call @ [phone]

## 2021-01-21 NOTE — Telephone Encounter (Signed)
Antonio Baker states he has not met his $4,000 out of pocket expense for his pharmacy plan. Next month his expected cost for Xarelto is $1,500. He asks if he still needs to take Xarelto.  His other complaint is that he has intense itching at night in his axillas, forearms ,elbows and knees.I encouraged him to discuss with PCP as I do not believe Crestor is the cause of this.

## 2021-01-23 DIAGNOSIS — Z86718 Personal history of other venous thrombosis and embolism: Secondary | ICD-10-CM | POA: Diagnosis not present

## 2021-01-23 DIAGNOSIS — L509 Urticaria, unspecified: Secondary | ICD-10-CM | POA: Diagnosis not present

## 2021-01-23 DIAGNOSIS — I10 Essential (primary) hypertension: Secondary | ICD-10-CM | POA: Diagnosis not present

## 2021-02-16 DIAGNOSIS — Z79899 Other long term (current) drug therapy: Secondary | ICD-10-CM | POA: Diagnosis not present

## 2021-02-16 DIAGNOSIS — E782 Mixed hyperlipidemia: Secondary | ICD-10-CM | POA: Diagnosis not present

## 2021-02-16 DIAGNOSIS — G894 Chronic pain syndrome: Secondary | ICD-10-CM | POA: Diagnosis not present

## 2021-02-16 DIAGNOSIS — G6289 Other specified polyneuropathies: Secondary | ICD-10-CM | POA: Diagnosis not present

## 2021-02-16 DIAGNOSIS — M1A09X0 Idiopathic chronic gout, multiple sites, without tophus (tophi): Secondary | ICD-10-CM | POA: Diagnosis not present

## 2021-02-16 DIAGNOSIS — I10 Essential (primary) hypertension: Secondary | ICD-10-CM | POA: Diagnosis not present

## 2021-02-16 DIAGNOSIS — Z86711 Personal history of pulmonary embolism: Secondary | ICD-10-CM | POA: Diagnosis not present

## 2021-02-16 DIAGNOSIS — J449 Chronic obstructive pulmonary disease, unspecified: Secondary | ICD-10-CM | POA: Diagnosis not present

## 2021-02-20 DIAGNOSIS — R0902 Hypoxemia: Secondary | ICD-10-CM | POA: Diagnosis not present

## 2021-03-10 ENCOUNTER — Telehealth: Payer: Self-pay | Admitting: Cardiology

## 2021-03-10 NOTE — Telephone Encounter (Signed)
Received lab work mailed from Antonio Baker.  Results are from September per PCP.  LDL cholesterol looks good at 73 on statin therapy.  Potassium and renal function are normal.  Liver function tests are also normal.  Hemoglobin is normal.  TSH normal and hemoglobin A1c is only 5.3%.  No changes anticipated for now.  Will scan into chart.

## 2021-03-11 NOTE — Telephone Encounter (Signed)
Pt notified and verbalized understanding.

## 2021-03-22 DIAGNOSIS — R0902 Hypoxemia: Secondary | ICD-10-CM | POA: Diagnosis not present

## 2021-04-15 DIAGNOSIS — I10 Essential (primary) hypertension: Secondary | ICD-10-CM | POA: Diagnosis not present

## 2021-04-15 DIAGNOSIS — G589 Mononeuropathy, unspecified: Secondary | ICD-10-CM | POA: Diagnosis not present

## 2021-04-15 DIAGNOSIS — Z86010 Personal history of colonic polyps: Secondary | ICD-10-CM | POA: Diagnosis not present

## 2021-04-22 DIAGNOSIS — R0902 Hypoxemia: Secondary | ICD-10-CM | POA: Diagnosis not present

## 2021-05-14 DIAGNOSIS — K635 Polyp of colon: Secondary | ICD-10-CM | POA: Diagnosis not present

## 2021-05-14 DIAGNOSIS — K5140 Inflammatory polyps of colon without complications: Secondary | ICD-10-CM | POA: Diagnosis not present

## 2021-05-14 DIAGNOSIS — Z86010 Personal history of colonic polyps: Secondary | ICD-10-CM | POA: Diagnosis not present

## 2021-05-22 DIAGNOSIS — R0902 Hypoxemia: Secondary | ICD-10-CM | POA: Diagnosis not present

## 2021-05-28 DIAGNOSIS — R300 Dysuria: Secondary | ICD-10-CM | POA: Diagnosis not present

## 2021-05-28 DIAGNOSIS — M791 Myalgia, unspecified site: Secondary | ICD-10-CM | POA: Diagnosis not present

## 2021-05-28 DIAGNOSIS — M549 Dorsalgia, unspecified: Secondary | ICD-10-CM | POA: Diagnosis not present

## 2021-06-22 DIAGNOSIS — R0902 Hypoxemia: Secondary | ICD-10-CM | POA: Diagnosis not present

## 2021-07-09 DIAGNOSIS — R059 Cough, unspecified: Secondary | ICD-10-CM | POA: Diagnosis not present

## 2021-07-09 DIAGNOSIS — J069 Acute upper respiratory infection, unspecified: Secondary | ICD-10-CM | POA: Diagnosis not present

## 2021-07-09 DIAGNOSIS — R062 Wheezing: Secondary | ICD-10-CM | POA: Diagnosis not present

## 2021-07-21 DIAGNOSIS — R0902 Hypoxemia: Secondary | ICD-10-CM | POA: Diagnosis not present

## 2021-08-20 DIAGNOSIS — R0902 Hypoxemia: Secondary | ICD-10-CM | POA: Diagnosis not present

## 2021-08-24 DIAGNOSIS — J449 Chronic obstructive pulmonary disease, unspecified: Secondary | ICD-10-CM | POA: Diagnosis not present

## 2021-08-24 DIAGNOSIS — I10 Essential (primary) hypertension: Secondary | ICD-10-CM | POA: Diagnosis not present

## 2021-08-24 DIAGNOSIS — G6289 Other specified polyneuropathies: Secondary | ICD-10-CM | POA: Diagnosis not present

## 2021-08-24 DIAGNOSIS — G894 Chronic pain syndrome: Secondary | ICD-10-CM | POA: Diagnosis not present

## 2021-08-24 DIAGNOSIS — E782 Mixed hyperlipidemia: Secondary | ICD-10-CM | POA: Diagnosis not present

## 2021-08-24 DIAGNOSIS — M1A09X0 Idiopathic chronic gout, multiple sites, without tophus (tophi): Secondary | ICD-10-CM | POA: Diagnosis not present

## 2021-08-29 ENCOUNTER — Other Ambulatory Visit: Payer: Self-pay | Admitting: Cardiology

## 2021-09-20 DIAGNOSIS — R0902 Hypoxemia: Secondary | ICD-10-CM | POA: Diagnosis not present

## 2021-09-25 ENCOUNTER — Other Ambulatory Visit: Payer: Self-pay | Admitting: Cardiology

## 2021-10-08 ENCOUNTER — Other Ambulatory Visit: Payer: Self-pay | Admitting: Cardiology

## 2021-10-20 DIAGNOSIS — R0902 Hypoxemia: Secondary | ICD-10-CM | POA: Diagnosis not present

## 2021-11-16 ENCOUNTER — Other Ambulatory Visit: Payer: Self-pay | Admitting: Cardiology

## 2021-11-17 ENCOUNTER — Other Ambulatory Visit: Payer: Self-pay | Admitting: Cardiology

## 2021-11-20 DIAGNOSIS — R0902 Hypoxemia: Secondary | ICD-10-CM | POA: Diagnosis not present

## 2021-11-28 ENCOUNTER — Other Ambulatory Visit: Payer: Self-pay | Admitting: Cardiology

## 2021-12-20 DIAGNOSIS — R0902 Hypoxemia: Secondary | ICD-10-CM | POA: Diagnosis not present

## 2021-12-24 ENCOUNTER — Other Ambulatory Visit: Payer: Self-pay | Admitting: Cardiology

## 2022-01-20 DIAGNOSIS — R0902 Hypoxemia: Secondary | ICD-10-CM | POA: Diagnosis not present

## 2022-01-26 ENCOUNTER — Other Ambulatory Visit: Payer: Self-pay | Admitting: Cardiology

## 2022-01-28 ENCOUNTER — Other Ambulatory Visit (HOSPITAL_COMMUNITY): Payer: Self-pay | Admitting: Family Medicine

## 2022-01-28 ENCOUNTER — Other Ambulatory Visit: Payer: Self-pay | Admitting: Family Medicine

## 2022-01-28 DIAGNOSIS — Z122 Encounter for screening for malignant neoplasm of respiratory organs: Secondary | ICD-10-CM

## 2022-01-28 DIAGNOSIS — G894 Chronic pain syndrome: Secondary | ICD-10-CM | POA: Diagnosis not present

## 2022-01-28 DIAGNOSIS — Z713 Dietary counseling and surveillance: Secondary | ICD-10-CM | POA: Diagnosis not present

## 2022-01-28 DIAGNOSIS — I10 Essential (primary) hypertension: Secondary | ICD-10-CM | POA: Diagnosis not present

## 2022-01-28 DIAGNOSIS — J449 Chronic obstructive pulmonary disease, unspecified: Secondary | ICD-10-CM | POA: Diagnosis not present

## 2022-01-28 DIAGNOSIS — Z7182 Exercise counseling: Secondary | ICD-10-CM | POA: Diagnosis not present

## 2022-01-28 DIAGNOSIS — Z79899 Other long term (current) drug therapy: Secondary | ICD-10-CM | POA: Diagnosis not present

## 2022-01-28 DIAGNOSIS — G6289 Other specified polyneuropathies: Secondary | ICD-10-CM | POA: Diagnosis not present

## 2022-01-28 DIAGNOSIS — E559 Vitamin D deficiency, unspecified: Secondary | ICD-10-CM | POA: Diagnosis not present

## 2022-01-28 DIAGNOSIS — Z87891 Personal history of nicotine dependence: Secondary | ICD-10-CM

## 2022-01-28 DIAGNOSIS — M1A09X0 Idiopathic chronic gout, multiple sites, without tophus (tophi): Secondary | ICD-10-CM | POA: Diagnosis not present

## 2022-01-28 DIAGNOSIS — R350 Frequency of micturition: Secondary | ICD-10-CM | POA: Diagnosis not present

## 2022-01-28 DIAGNOSIS — E782 Mixed hyperlipidemia: Secondary | ICD-10-CM | POA: Diagnosis not present

## 2022-02-02 ENCOUNTER — Encounter: Payer: Self-pay | Admitting: *Deleted

## 2022-02-20 DIAGNOSIS — R0902 Hypoxemia: Secondary | ICD-10-CM | POA: Diagnosis not present

## 2022-03-16 ENCOUNTER — Other Ambulatory Visit: Payer: Self-pay | Admitting: Cardiology

## 2022-03-22 DIAGNOSIS — R0902 Hypoxemia: Secondary | ICD-10-CM | POA: Diagnosis not present

## 2022-03-26 DIAGNOSIS — Z23 Encounter for immunization: Secondary | ICD-10-CM | POA: Diagnosis not present

## 2022-04-01 ENCOUNTER — Ambulatory Visit: Payer: Medicare Other | Admitting: Neurology

## 2022-04-01 ENCOUNTER — Telehealth: Payer: Self-pay | Admitting: Neurology

## 2022-04-01 NOTE — Telephone Encounter (Signed)
Pt got lost trying to find [facility]. Rescheduled appt.

## 2022-04-07 DIAGNOSIS — J449 Chronic obstructive pulmonary disease, unspecified: Secondary | ICD-10-CM | POA: Diagnosis not present

## 2022-04-07 DIAGNOSIS — J988 Other specified respiratory disorders: Secondary | ICD-10-CM | POA: Diagnosis not present

## 2022-04-07 DIAGNOSIS — J069 Acute upper respiratory infection, unspecified: Secondary | ICD-10-CM | POA: Diagnosis not present

## 2022-04-22 DIAGNOSIS — R0902 Hypoxemia: Secondary | ICD-10-CM | POA: Diagnosis not present

## 2022-05-07 ENCOUNTER — Encounter (HOSPITAL_COMMUNITY): Payer: Self-pay

## 2022-05-07 ENCOUNTER — Emergency Department (HOSPITAL_COMMUNITY): Payer: Medicare Other

## 2022-05-07 ENCOUNTER — Emergency Department (HOSPITAL_COMMUNITY)
Admission: EM | Admit: 2022-05-07 | Discharge: 2022-05-07 | Disposition: A | Discharge status: Home / Self Care | Payer: Medicare Other | Attending: Emergency Medicine | Admitting: Emergency Medicine

## 2022-05-07 ENCOUNTER — Other Ambulatory Visit: Payer: Self-pay

## 2022-05-07 DIAGNOSIS — R062 Wheezing: Secondary | ICD-10-CM | POA: Diagnosis not present

## 2022-05-07 DIAGNOSIS — R0602 Shortness of breath: Secondary | ICD-10-CM | POA: Diagnosis not present

## 2022-05-07 DIAGNOSIS — Z1152 Encounter for screening for COVID-19: Secondary | ICD-10-CM | POA: Insufficient documentation

## 2022-05-07 DIAGNOSIS — J441 Chronic obstructive pulmonary disease with (acute) exacerbation: Secondary | ICD-10-CM | POA: Diagnosis not present

## 2022-05-07 DIAGNOSIS — J439 Emphysema, unspecified: Secondary | ICD-10-CM | POA: Diagnosis not present

## 2022-05-07 DIAGNOSIS — R059 Cough, unspecified: Secondary | ICD-10-CM | POA: Diagnosis not present

## 2022-05-07 LAB — COMPREHENSIVE METABOLIC PANEL
ALT: 27 U/L (ref 0–44)
AST: 25 U/L (ref 15–41)
Albumin: 4 g/dL (ref 3.5–5.0)
Alkaline Phosphatase: 65 U/L (ref 38–126)
Anion gap: 9 (ref 5–15)
BUN: 17 mg/dL (ref 8–23)
CO2: 27 mmol/L (ref 22–32)
Calcium: 10.4 mg/dL — ABNORMAL HIGH (ref 8.9–10.3)
Chloride: 105 mmol/L (ref 98–111)
Creatinine, Ser: 0.87 mg/dL (ref 0.61–1.24)
GFR, Estimated: >60 mL/min (ref >60)
Glucose, Bld: 100 mg/dL — ABNORMAL HIGH (ref 70–99)
Potassium: 4.5 mmol/L (ref 3.5–5.1)
Sodium: 141 mmol/L (ref 135–145)
Total Bilirubin: 0.8 mg/dL (ref 0.3–1.2)
Total Protein: 7.8 g/dL (ref 6.5–8.1)

## 2022-05-07 LAB — CBC WITH DIFFERENTIAL/PLATELET
Abs Immature Granulocytes: 0.05 10*3/uL (ref 0.00–0.07)
Basophils Absolute: 0.1 10*3/uL (ref 0.0–0.1)
Basophils Relative: 1 %
Eosinophils Absolute: 0.5 10*3/uL (ref 0.0–0.5)
Eosinophils Relative: 5 %
HCT: 44.3 % (ref 39.0–52.0)
Hemoglobin: 14.8 g/dL (ref 13.0–17.0)
Immature Granulocytes: 1 %
Lymphocytes Relative: 23 %
Lymphs Abs: 2.3 10*3/uL (ref 0.7–4.0)
MCH: 31.4 pg (ref 26.0–34.0)
MCHC: 33.4 g/dL (ref 30.0–36.0)
MCV: 94.1 fL (ref 80.0–100.0)
Monocytes Absolute: 1 10*3/uL (ref 0.1–1.0)
Monocytes Relative: 10 %
Neutro Abs: 6.1 10*3/uL (ref 1.7–7.7)
Neutrophils Relative %: 60 %
Platelets: 239 10*3/uL (ref 150–400)
RBC: 4.71 MIL/uL (ref 4.22–5.81)
RDW: 12.5 % (ref 11.5–15.5)
WBC: 10 10*3/uL (ref 4.0–10.5)
nRBC: 0 % (ref 0.0–0.2)

## 2022-05-07 LAB — RESP PANEL BY RT-PCR (RSV, FLU A&B, COVID)  RVPGX2
Influenza A by PCR: NEGATIVE
Influenza B by PCR: NEGATIVE
Resp Syncytial Virus by PCR: NEGATIVE
SARS Coronavirus 2 by RT PCR: NEGATIVE

## 2022-05-07 LAB — BRAIN NATRIURETIC PEPTIDE: B Natriuretic Peptide: 14 pg/mL (ref 0.0–100.0)

## 2022-05-07 MED ORDER — IPRATROPIUM-ALBUTEROL 0.5-2.5 (3) MG/3ML IN SOLN
3.0000 mL | Freq: Once | RESPIRATORY_TRACT | Status: AC
  Administered 2022-05-07: 3 mL via RESPIRATORY_TRACT
  Filled 2022-05-07: qty 3

## 2022-05-07 MED ORDER — PREDNISONE 10 MG PO TABS: 20.0000 mg | ORAL_TABLET | Freq: Every day | ORAL | 0 refills | Status: DC

## 2022-05-07 MED ORDER — SODIUM CHLORIDE 0.9 % IV SOLN: 2.0000 g | Freq: Once | INTRAVENOUS | Status: DC

## 2022-05-07 MED ORDER — ALBUTEROL SULFATE (5 MG/ML) 0.5% IN NEBU: 2.5000 mg | INHALATION_SOLUTION | Freq: Four times a day (QID) | RESPIRATORY_TRACT | 12 refills | Status: AC | PRN

## 2022-05-07 MED ORDER — SODIUM CHLORIDE 0.9 % IV BOLUS: 1000.0000 mL | Freq: Once | INTRAVENOUS | Status: DC

## 2022-05-07 MED ORDER — PREDNISONE 50 MG PO TABS
60.0000 mg | ORAL_TABLET | Freq: Once | ORAL | Status: AC
  Administered 2022-05-07: 60 mg via ORAL
  Filled 2022-05-07: qty 1

## 2022-05-07 MED ORDER — ALBUTEROL SULFATE (2.5 MG/3ML) 0.083% IN NEBU
2.5000 mg | INHALATION_SOLUTION | Freq: Once | RESPIRATORY_TRACT | Status: AC
  Administered 2022-05-07: 2.5 mg via RESPIRATORY_TRACT
  Filled 2022-05-07: qty 3

## 2022-05-07 NOTE — Discharge Instructions (Signed)
Follow-up with your family doctor in 1 to 2 weeks for recheck

## 2022-05-07 NOTE — ED Triage Notes (Signed)
Patient reports cough and chest congestion intermittently for the past two months. Denies fever. States that he has been having lower back pain that he believes resulted from coughing. Negative Covid test yesterday.

## 2022-05-07 NOTE — ED Notes (Signed)
Returned from [place], phlebotomy at bedside

## 2022-05-07 NOTE — ED Provider Notes (Signed)
[facility] EMERGENCY DEPARTMENT Provider Note   CSN: [id number] Arrival date & time: 05/07/22  3536     History {Add pertinent medical, surgical, social history, OB history to HPI:1} Chief Complaint  Patient presents with   Cough    Antonio Baker is a 66 y.o. male.  Patient has a history of COPD.  He complains of cough and shortness of breath for last couple weeks   Cough      Home Medications Prior to Admission medications   Medication Sig Start Date End Date Taking? Authorizing Provider  albuterol (PROVENTIL) (5 MG/ML) 0.5% nebulizer solution Take 0.5 mLs (2.5 mg total) by nebulization every 6 (six) hours as needed for wheezing or shortness of breath. 05/07/22  Yes Milton Ferguson, MD  allopurinol (ZYLOPRIM) 300 MG tablet Take 300 mg by mouth daily.    Yes [provider]  APPLE CIDER VINEGAR PO Take 45 mLs by mouth daily.    Yes [provider]  carvedilol (COREG) 6.25 MG tablet TAKE 1 TABLET BY MOUTH TWICE A DAY WITH FOOD 03/16/22  Yes Satira Sark, MD  Cyanocobalamin (B-12 PO) Take 2,500 mg by mouth daily.    Yes [provider]  hydrochlorothiazide (HYDRODIURIL) 25 MG tablet Take 25 mg by mouth daily. 05/05/22  Yes [provider]  HYDROcodone-acetaminophen (NORCO) 10-325 MG tablet Take 1 tablet by mouth 2 (two) times daily as needed for moderate pain. 04/27/22  Yes [provider]  losartan (COZAAR) 100 MG tablet Take 100 mg by mouth daily. 04/20/22  Yes [provider]  nitroGLYCERIN (NITROSTAT) 0.4 MG SL tablet Place 1 tablet (0.4 mg total) under the tongue every 5 (five) minutes as needed for chest pain (CP or SOB). 08/17/19  Yes Bhagat, Bhavinkumar, PA  Omega-3 Fatty Acids (FISH OIL) 1200 MG CAPS Take 2 capsules by mouth 2 (two) times daily.   Yes [provider]  predniSONE (DELTASONE) 10 MG tablet Take 2 tablets (20 mg total) by mouth daily. 05/07/22  Yes Milton Ferguson, MD  rosuvastatin (CRESTOR)  20 MG tablet TAKE 1 TABLET BY MOUTH EVERY DAY AT 6 PM Patient taking differently: Take 20 mg by mouth daily. 10/08/21  Yes Satira Sark, MD  umeclidinium-vilanterol (ANORO ELLIPTA) 62.5-25 MCG/INH AEPB Inhale 1 puff into the lungs daily as needed (wheezing).   Yes [provider]      Allergies    Patient has no known allergies.    Review of Systems   Review of Systems  Respiratory:  Positive for cough.     Physical Exam Updated Vital Signs BP 130/79   Pulse 90   Temp 98.3 F (36.8 C) (Oral)   Resp 17   Ht '6\' 2"'$  (1.88 m)   Wt 97.5 kg   SpO2 98%   BMI 27.60 kg/m  Physical Exam  ED Results / Procedures / Treatments   Labs (all labs ordered are listed, but only abnormal results are displayed) Labs Reviewed  COMPREHENSIVE METABOLIC PANEL - Abnormal; Notable for the following components:      Result Value   Glucose, Bld 100 (*)    Calcium 10.4 (*)    All other components within normal limits  RESP PANEL BY RT-PCR (RSV, FLU A&B, COVID)  [id number]  CBC WITH DIFFERENTIAL/PLATELET  BRAIN NATRIURETIC PEPTIDE    EKG None  Radiology DG Chest 2 View  Result Date: 05/07/2022 CLINICAL DATA:  Shortness of breath, productive cough, congestion, and wheezing for 1 month. History of COPD.  EXAM: CHEST - 2 VIEW COMPARISON:  AP chest 08/16/2019 01/25/2015; chest two views 05/04/2012 FINDINGS: Cardiac silhouette and mediastinal contours are within normal limits. Moderate calcification within the aortic arch. There is flattening of the diaphragms and moderate hyperinflation, unchanged. No acute airspace opacity to indicate pneumonia. Increased lucency is again seen within the upper lungs with attenuation of the pulmonary vasculature suggesting emphysematous changes. No pleural effusion or pneumothorax. Moderate multilevel degenerative disc changes of the thoracic spine. Moderate anterior height loss of a midthoracic vertebral body is unchanged from 05/04/2012 lateral radiograph.  IMPRESSION: 1. No active cardiopulmonary disease. 2. Moderate hyperinflation and emphysematous changes, unchanged from 05/04/2012. Electronically Signed   By: Yvonne Kendall M.D.   On: 05/07/2022 08:55    Procedures Procedures  {Document cardiac monitor, telemetry assessment procedure when appropriate:1}  Medications Ordered in ED Medications  ipratropium-albuterol (DUONEB) 0.5-2.5 (3) MG/3ML nebulizer solution 3 mL (3 mLs Nebulization Given 05/07/22 0901)  albuterol (PROVENTIL) (2.5 MG/3ML) 0.083% nebulizer solution 2.5 mg (2.5 mg Nebulization Given 05/07/22 0900)  predniSONE (DELTASONE) tablet 60 mg (60 mg Oral Given 05/07/22 3734)    ED Course/ Medical Decision Making/ A&P                           Medical Decision Making Amount and/or Complexity of Data Reviewed Labs: ordered. Radiology: ordered.  Risk Prescription drug management.   Patient with COPD exacerbation.  He improved with treatment here.  Will put him on prednisone he will follow-up with his PCP  {Document critical care time when appropriate:1} {Document review of labs and clinical decision tools ie heart score, Chads2Vasc2 etc:1}  {Document your independent review of radiology images, and any outside records:1} {Document your discussion with family members, caretakers, and with consultants:1} {Document social determinants of health affecting pt's care:1} {Document your decision making why or why not admission, treatments were needed:1} Final Clinical Impression(s) / ED Diagnoses Final diagnoses:  COPD exacerbation ([place])    Rx / DC Orders ED Discharge Orders          Ordered    predniSONE (DELTASONE) 10 MG tablet  Daily        05/07/22 1232    albuterol (PROVENTIL) (5 MG/ML) 0.5% nebulizer solution  Every 6 hours PRN        05/07/22 1232

## 2022-05-12 ENCOUNTER — Encounter (HOSPITAL_COMMUNITY): Payer: Self-pay

## 2022-05-12 ENCOUNTER — Ambulatory Visit (HOSPITAL_COMMUNITY): Admission: RE | Admit: 2022-05-12 | Payer: Medicare Other | Source: Ambulatory Visit

## 2022-05-13 ENCOUNTER — Ambulatory Visit: Payer: Medicare Other | Admitting: Neurology

## 2022-05-22 DIAGNOSIS — R0902 Hypoxemia: Secondary | ICD-10-CM | POA: Diagnosis not present

## 2022-06-12 ENCOUNTER — Other Ambulatory Visit: Payer: Self-pay | Admitting: Cardiology

## 2022-06-22 DIAGNOSIS — R0902 Hypoxemia: Secondary | ICD-10-CM | POA: Diagnosis not present

## 2022-06-28 ENCOUNTER — Other Ambulatory Visit: Payer: Self-pay | Admitting: Cardiology

## 2022-07-22 DIAGNOSIS — R0902 Hypoxemia: Secondary | ICD-10-CM | POA: Diagnosis not present

## 2022-08-06 ENCOUNTER — Ambulatory Visit: Payer: Medicare Other | Admitting: Cardiology

## 2022-08-19 DIAGNOSIS — G6289 Other specified polyneuropathies: Secondary | ICD-10-CM | POA: Diagnosis not present

## 2022-08-19 DIAGNOSIS — E559 Vitamin D deficiency, unspecified: Secondary | ICD-10-CM | POA: Diagnosis not present

## 2022-08-19 DIAGNOSIS — I10 Essential (primary) hypertension: Secondary | ICD-10-CM | POA: Diagnosis not present

## 2022-08-19 DIAGNOSIS — E782 Mixed hyperlipidemia: Secondary | ICD-10-CM | POA: Diagnosis not present

## 2022-08-19 DIAGNOSIS — D72829 Elevated white blood cell count, unspecified: Secondary | ICD-10-CM | POA: Diagnosis not present

## 2022-08-19 DIAGNOSIS — M1A09X0 Idiopathic chronic gout, multiple sites, without tophus (tophi): Secondary | ICD-10-CM | POA: Diagnosis not present

## 2022-08-19 DIAGNOSIS — J449 Chronic obstructive pulmonary disease, unspecified: Secondary | ICD-10-CM | POA: Diagnosis not present

## 2022-08-19 DIAGNOSIS — R899 Unspecified abnormal finding in specimens from other organs, systems and tissues: Secondary | ICD-10-CM | POA: Diagnosis not present

## 2022-08-19 DIAGNOSIS — G894 Chronic pain syndrome: Secondary | ICD-10-CM | POA: Diagnosis not present

## 2022-08-21 DIAGNOSIS — R0902 Hypoxemia: Secondary | ICD-10-CM | POA: Diagnosis not present

## 2022-09-21 DIAGNOSIS — R0902 Hypoxemia: Secondary | ICD-10-CM | POA: Diagnosis not present

## 2022-10-21 DIAGNOSIS — R0902 Hypoxemia: Secondary | ICD-10-CM | POA: Diagnosis not present

## 2022-11-21 DIAGNOSIS — R0902 Hypoxemia: Secondary | ICD-10-CM | POA: Diagnosis not present

## 2022-12-21 DIAGNOSIS — R0902 Hypoxemia: Secondary | ICD-10-CM | POA: Diagnosis not present

## 2023-01-21 DIAGNOSIS — R0902 Hypoxemia: Secondary | ICD-10-CM | POA: Diagnosis not present

## 2023-02-24 ENCOUNTER — Other Ambulatory Visit: Payer: Self-pay | Admitting: Cardiology

## 2023-02-24 DIAGNOSIS — G6289 Other specified polyneuropathies: Secondary | ICD-10-CM | POA: Diagnosis not present

## 2023-02-24 DIAGNOSIS — M1A09X0 Idiopathic chronic gout, multiple sites, without tophus (tophi): Secondary | ICD-10-CM | POA: Diagnosis not present

## 2023-02-24 DIAGNOSIS — G894 Chronic pain syndrome: Secondary | ICD-10-CM | POA: Diagnosis not present

## 2023-02-24 DIAGNOSIS — E782 Mixed hyperlipidemia: Secondary | ICD-10-CM | POA: Diagnosis not present

## 2023-02-24 DIAGNOSIS — J449 Chronic obstructive pulmonary disease, unspecified: Secondary | ICD-10-CM | POA: Diagnosis not present

## 2023-02-24 DIAGNOSIS — Z713 Dietary counseling and surveillance: Secondary | ICD-10-CM | POA: Diagnosis not present

## 2023-02-24 DIAGNOSIS — Z7182 Exercise counseling: Secondary | ICD-10-CM | POA: Diagnosis not present

## 2023-02-24 DIAGNOSIS — E785 Hyperlipidemia, unspecified: Secondary | ICD-10-CM | POA: Diagnosis not present

## 2023-02-24 DIAGNOSIS — I10 Essential (primary) hypertension: Secondary | ICD-10-CM | POA: Diagnosis not present

## 2023-02-25 ENCOUNTER — Other Ambulatory Visit (HOSPITAL_COMMUNITY): Payer: Self-pay | Admitting: Family Medicine

## 2023-02-25 DIAGNOSIS — Z136 Encounter for screening for cardiovascular disorders: Secondary | ICD-10-CM

## 2023-03-03 ENCOUNTER — Ambulatory Visit (HOSPITAL_COMMUNITY): Payer: Medicare Other

## 2023-03-10 ENCOUNTER — Ambulatory Visit (HOSPITAL_COMMUNITY)
Admission: RE | Admit: 2023-03-10 | Discharge: 2023-03-10 | Disposition: A | Discharge status: Home / Self Care | Payer: Medicare Other | Source: Ambulatory Visit | Attending: Family Medicine | Admitting: Family Medicine

## 2023-03-10 DIAGNOSIS — Z87891 Personal history of nicotine dependence: Secondary | ICD-10-CM | POA: Diagnosis not present

## 2023-03-10 DIAGNOSIS — Z136 Encounter for screening for cardiovascular disorders: Secondary | ICD-10-CM | POA: Insufficient documentation

## 2023-03-10 DIAGNOSIS — I10 Essential (primary) hypertension: Secondary | ICD-10-CM | POA: Insufficient documentation

## 2023-07-02 DIAGNOSIS — R079 Chest pain, unspecified: Secondary | ICD-10-CM | POA: Diagnosis not present

## 2023-07-02 DIAGNOSIS — R7989 Other specified abnormal findings of blood chemistry: Secondary | ICD-10-CM | POA: Diagnosis not present

## 2023-07-02 DIAGNOSIS — Z1152 Encounter for screening for COVID-19: Secondary | ICD-10-CM | POA: Diagnosis not present

## 2023-07-02 DIAGNOSIS — I499 Cardiac arrhythmia, unspecified: Secondary | ICD-10-CM | POA: Diagnosis not present

## 2023-07-02 DIAGNOSIS — Z91148 Patient's other noncompliance with medication regimen for other reason: Secondary | ICD-10-CM | POA: Diagnosis not present

## 2023-07-02 DIAGNOSIS — Z20822 Contact with and (suspected) exposure to covid-19: Secondary | ICD-10-CM | POA: Diagnosis not present

## 2023-07-02 DIAGNOSIS — I249 Acute ischemic heart disease, unspecified: Secondary | ICD-10-CM | POA: Diagnosis not present

## 2023-07-02 DIAGNOSIS — R000 Tachycardia, unspecified: Secondary | ICD-10-CM | POA: Diagnosis not present

## 2023-07-02 DIAGNOSIS — Z86711 Personal history of pulmonary embolism: Secondary | ICD-10-CM | POA: Diagnosis not present

## 2023-07-02 DIAGNOSIS — Z87891 Personal history of nicotine dependence: Secondary | ICD-10-CM | POA: Diagnosis not present

## 2023-07-02 DIAGNOSIS — M109 Gout, unspecified: Secondary | ICD-10-CM | POA: Diagnosis not present

## 2023-07-02 DIAGNOSIS — I4891 Unspecified atrial fibrillation: Secondary | ICD-10-CM | POA: Diagnosis not present

## 2023-07-02 DIAGNOSIS — Z86718 Personal history of other venous thrombosis and embolism: Secondary | ICD-10-CM | POA: Diagnosis not present

## 2023-07-02 DIAGNOSIS — I2699 Other pulmonary embolism without acute cor pulmonale: Secondary | ICD-10-CM | POA: Diagnosis not present

## 2023-07-02 DIAGNOSIS — Z79899 Other long term (current) drug therapy: Secondary | ICD-10-CM | POA: Diagnosis not present

## 2023-07-02 DIAGNOSIS — I82412 Acute embolism and thrombosis of left femoral vein: Secondary | ICD-10-CM | POA: Diagnosis not present

## 2023-07-02 DIAGNOSIS — E7800 Pure hypercholesterolemia, unspecified: Secondary | ICD-10-CM | POA: Diagnosis not present

## 2023-07-02 DIAGNOSIS — I4819 Other persistent atrial fibrillation: Secondary | ICD-10-CM | POA: Diagnosis not present

## 2023-07-02 DIAGNOSIS — I2694 Multiple subsegmental pulmonary emboli without acute cor pulmonale: Secondary | ICD-10-CM | POA: Diagnosis not present

## 2023-07-02 DIAGNOSIS — I10 Essential (primary) hypertension: Secondary | ICD-10-CM | POA: Diagnosis not present

## 2023-07-02 DIAGNOSIS — J449 Chronic obstructive pulmonary disease, unspecified: Secondary | ICD-10-CM | POA: Diagnosis not present

## 2023-07-03 DIAGNOSIS — R0602 Shortness of breath: Secondary | ICD-10-CM | POA: Diagnosis not present

## 2023-07-03 DIAGNOSIS — I4819 Other persistent atrial fibrillation: Secondary | ICD-10-CM | POA: Diagnosis not present

## 2023-07-03 DIAGNOSIS — I2694 Multiple subsegmental pulmonary emboli without acute cor pulmonale: Secondary | ICD-10-CM | POA: Diagnosis not present

## 2023-07-15 ENCOUNTER — Encounter: Payer: Self-pay | Admitting: Internal Medicine

## 2023-07-15 ENCOUNTER — Ambulatory Visit: Payer: Medicare Other | Attending: Internal Medicine | Admitting: Internal Medicine

## 2023-07-15 VITALS — BP 170/90 | HR 76 | Ht 74.0 in | Wt 219.0 lb

## 2023-07-15 DIAGNOSIS — I350 Nonrheumatic aortic (valve) stenosis: Secondary | ICD-10-CM

## 2023-07-15 DIAGNOSIS — Z86711 Personal history of pulmonary embolism: Secondary | ICD-10-CM

## 2023-07-15 DIAGNOSIS — I10 Essential (primary) hypertension: Secondary | ICD-10-CM

## 2023-07-15 DIAGNOSIS — R0789 Other chest pain: Secondary | ICD-10-CM

## 2023-07-15 MED ORDER — CARVEDILOL 6.25 MG PO TABS: 6.2500 mg | ORAL_TABLET | Freq: Two times a day (BID) | ORAL | 3 refills | Status: AC

## 2023-07-15 NOTE — Patient Instructions (Signed)
 Medication Instructions:  Your physician has recommended you make the following change in your medication:   -Stop Metoprolol  -Start Coreg 6.25 mg twice daily    *If you need a refill on your cardiac medications before your next appointment, please call your pharmacy*   Lab Work: None If you have labs (blood work) drawn today and your tests are completely normal, you will receive your results only by: MyChart Message (if you have MyChart) OR A paper copy in the mail If you have any lab test that is abnormal or we need to change your treatment, we will call you to review the results.   Testing/Procedures: None   Follow-Up: At [facility], you and your health needs are our priority.  As part of our continuing mission to provide you with exceptional heart care, we have created designated Provider Care Teams.  These Care Teams include your primary Cardiologist (physician) and Advanced Practice Providers (APPs -  Physician Assistants and Nurse Practitioners) who all work together to provide you with the care you need, when you need it.  We recommend signing up for the patient portal called "MyChart".  Sign up information is provided on this After Visit Summary.  MyChart is used to connect with patients for Virtual Visits (Telemedicine).  Patients are able to view lab/test results, encounter notes, upcoming appointments, etc.  Non-urgent messages can be sent to your provider as well.   To learn more about what you can do with MyChart, go to [email or web address].    Your next appointment:   1 year(s)  Provider:   You may see Vishnu P Mallipeddi, MD or one of the following Advanced Practice Providers on your designated Care Team:   [place], PA-C  Jacolyn Reedy, [place]     Other Instructions

## 2023-07-15 NOTE — Progress Notes (Signed)
 Cardiology Office Note  Date: 07/15/2023   ID: [name], [name] 10-18-1955, MRN [id number]  PCP:  Wilmon Pali, FNP  Cardiologist:  Marjo Bicker, MD Electrophysiologist:  None   History of Present Illness: Antonio Baker is a 68 y.o. male known to have nonobstructive CAD by LHC in 2021, HTN, DM 2, history of pulm embolism on Xarelto, COPD was referred to cardiology clinic to establish care.  Patient reported that he was admitted to [facility] around 2 weeks ago, was diagnosed with pulmonary embolism and had to be started on Xarelto.  He also reported that he underwent echocardiogram and was told everything was normal.  I reviewed echocardiogram from 2021 that showed normal LVEF and mild aortic valve stenosis.  He does have occasional chest pains at rest but no chest pain with exertion.  No DOE, dizziness, presyncope, syncope, palpitations or leg swelling.  Past Medical History:  Diagnosis Date   Asthma    Chronic pain    COPD (chronic obstructive pulmonary disease) ([facility])    Coronary atherosclerosis    Mild to moderate by cardiac catheterization March 2021   Degenerative disc disease, lumbar    Essential hypertension    Gout    History of DVT (deep vein thrombosis)    History of pulmonary embolus (PE) 2016   Hyperlipidemia    Hypertension    Neuropathy    Peripheral neuropathic pain    Pulmonary embolism [facility])     Past Surgical History:  Procedure Laterality Date   CORONARY ULTRASOUND/IVUS N/A 08/16/2019   Procedure: Intravascular Ultrasound/IVUS;  Surgeon: Corky Crafts, MD;  Location: [facility] INVASIVE CV LAB;  Service: Cardiovascular;  Laterality: N/A;   ESOPHAGOGASTRODUODENOSCOPY  06/12/2012   WUJ:[id number] hiatal hernia/WHITE PLAQUES MOST LIKELY DUE TO CANDIDA ESOPHAGITIS/Single ulcer at the gastroesophageal junction-MOST LIKELY CAUSE FOR ODYNOPHAGIA/Stricture  at the gastroesophageal junction   HERNIA REPAIR     KNEE ARTHROSCOPY     LEFT HEART CATH AND CORONARY  ANGIOGRAPHY N/A 08/16/2019   Procedure: LEFT HEART CATH AND CORONARY ANGIOGRAPHY;  Surgeon: Corky Crafts, MD;  Location: [facility] INVASIVE CV LAB;  Service: Cardiovascular;  Laterality: N/A;   Right knee arthroscopy      Current Outpatient Medications  Medication Sig Dispense Refill   albuterol (PROVENTIL) (5 MG/ML) 0.5% nebulizer solution Take 0.5 mLs (2.5 mg total) by nebulization every 6 (six) hours as needed for wheezing or shortness of breath. 20 mL 12   allopurinol (ZYLOPRIM) 300 MG tablet Take 300 mg by mouth daily.      APPLE CIDER VINEGAR PO Take 45 mLs by mouth daily.      carvedilol (COREG) 6.25 MG tablet ONE (1) TABLET BY MOUTH TWICE DAILY WITH FOOD 60 tablet 0   Cyanocobalamin (B-12 PO) Take 2,500 mg by mouth daily.      hydrochlorothiazide (HYDRODIURIL) 25 MG tablet Take 25 mg by mouth daily.     HYDROcodone-acetaminophen (NORCO) 10-325 MG tablet Take 1 tablet by mouth 2 (two) times daily as needed for moderate pain.     losartan (COZAAR) 100 MG tablet Take 100 mg by mouth daily.     nitroGLYCERIN (NITROSTAT) 0.4 MG SL tablet Place 1 tablet (0.4 mg total) under the tongue every 5 (five) minutes as needed for chest pain (CP or SOB). 25 tablet 12   Omega-3 Fatty Acids (FISH OIL) 1200 MG CAPS Take 2 capsules by mouth 2 (two) times daily.     predniSONE (DELTASONE) 10 MG tablet Take  2 tablets (20 mg total) by mouth daily. 14 tablet 0   rosuvastatin (CRESTOR) 20 MG tablet TAKE 1 TABLET BY MOUTH EVERY DAY AT 6 PM (Patient taking differently: Take 20 mg by mouth daily.) 7 tablet 0   umeclidinium-vilanterol (ANORO ELLIPTA) 62.5-25 MCG/INH AEPB Inhale 1 puff into the lungs daily as needed (wheezing).     No current facility-administered medications for this visit.   Allergies:  Patient has no known allergies.   Social History: The patient  reports that he quit smoking about 24 years ago. His smoking use included cigarettes. He started smoking about 44 years ago. He has a 40 pack-year  smoking history. He has never used smokeless tobacco. He reports current alcohol use. He reports that he does not use drugs.   Family History: The patient's family history includes Cancer in his mother; Hypertension in his father; Kidney disease in his father; Stroke in his father.   ROS:  Please see the history of present illness. Otherwise, complete review of systems is positive for none.  All other systems are reviewed and negative.   Physical Exam: VS:  There were no vitals taken for this visit., BMI There is no height or weight on file to calculate BMI.  Wt Readings from Last 3 Encounters:  05/07/22 215 lb (97.5 kg)  06/25/20 222 lb (100.7 kg)  12/21/19 (!) 223 lb (101.2 kg)    General: Patient appears comfortable at rest. HEENT: Conjunctiva and lids normal, oropharynx clear with moist mucosa. Neck: Supple, no elevated JVP or carotid bruits, no thyromegaly. Lungs: Clear to auscultation, nonlabored breathing at rest. Cardiac: Regular rate and rhythm, ESM, S2 present Abdomen: Soft, nontender, no hepatomegaly, bowel sounds present, no guarding or rebound. Extremities: No pitting edema, distal pulses 2+. Skin: Warm and dry. Musculoskeletal: No kyphosis. Neuropsychiatric: Alert and oriented x3, affect grossly appropriate.  Recent Labwork: No results found for requested labs within last 365 days.     Component Value Date/Time   CHOL 142 12/17/2019 1032   TRIG 117 12/17/2019 1032   HDL 42 12/17/2019 1032   CHOLHDL 3.4 12/17/2019 1032   VLDL 23 12/17/2019 1032   LDLCALC 77 12/17/2019 1032     Assessment and Plan:  Mild aortic valve stenosis in 2021: Asymptomatic.  He underwent echocardiogram at [facility] in 2 weeks ago.  Will obtain echocardiogram report.  Noncardiac chest pain: He has occasional chest pains at rest but no chest pain with exertion.  He is physically active at baseline.  He had nonobstructive CAD by LHC in 2021.  No indication for ischemia evaluation at this  time.  HTN, partially controlled: Home blood pressures reviewed, ranges between 130 and 140 mmHg SBP.  He probably has a component of whitecoat hypertension due to elevated blood pressures today in the office.  Currently taking carvedilol 3.25 mg once daily, instructed him to take it twice daily.  Continue HCTZ 25 mg once daily and losartan 100 mg once daily.  HLD, unknown values: Continue rosuvastatin 20 mg nightly.  Goal LDL less than 100.  History of pulm embolism: Currently on Xarelto, follows with PCP.    Medication Adjustments/Labs and Tests Ordered: Current medicines are reviewed at length with the patient today.  Concerns regarding medicines are outlined above.    Disposition:  Follow up 1 year  Signed, Antonio Baker Verne Spurr, MD, 07/15/2023 2:39 PM    [facility] at [facility] [address], [place], [place] [address]

## 2023-07-21 DIAGNOSIS — M1A09X0 Idiopathic chronic gout, multiple sites, without tophus (tophi): Secondary | ICD-10-CM | POA: Diagnosis not present

## 2023-07-21 DIAGNOSIS — I10 Essential (primary) hypertension: Secondary | ICD-10-CM | POA: Diagnosis not present

## 2023-07-21 DIAGNOSIS — J449 Chronic obstructive pulmonary disease, unspecified: Secondary | ICD-10-CM | POA: Diagnosis not present

## 2023-07-21 DIAGNOSIS — G894 Chronic pain syndrome: Secondary | ICD-10-CM | POA: Diagnosis not present

## 2023-07-21 DIAGNOSIS — E782 Mixed hyperlipidemia: Secondary | ICD-10-CM | POA: Diagnosis not present

## 2023-07-21 DIAGNOSIS — Z713 Dietary counseling and surveillance: Secondary | ICD-10-CM | POA: Diagnosis not present

## 2023-07-21 DIAGNOSIS — Z7182 Exercise counseling: Secondary | ICD-10-CM | POA: Diagnosis not present

## 2023-07-26 ENCOUNTER — Telehealth: Payer: Self-pay

## 2023-07-26 ENCOUNTER — Telehealth: Payer: Self-pay | Admitting: Internal Medicine

## 2023-07-26 NOTE — Telephone Encounter (Signed)
 Lpm informing him that his PCP would have to monitor INR checks if switches from Xarelto to Coumadin

## 2023-07-26 NOTE — Telephone Encounter (Signed)
 Pt c/o medication issue:  1. Name of Medication:   Rivaroxaban (XARELTO) 15 MG TABS tablet    2. How are you currently taking this medication (dosage and times per day)? As written   3. Are you having a reaction (difficulty breathing--STAT)? No   4. What is your medication issue? Pt called in stating he spoke to his insurance company and was told Warfarin would not cost him anything. He would like to know if he can make this switch. Please advise.

## 2023-07-29 ENCOUNTER — Telehealth: Payer: Self-pay

## 2023-07-29 NOTE — Telephone Encounter (Signed)
 I spoke to patient in regards to transition from Xarelto to Warfarin and advised him that Dr Laural Golden recommended that PCP monitors transition.  He verbalized understanding

## 2023-08-01 ENCOUNTER — Ambulatory Visit: Payer: Medicare Other | Admitting: Internal Medicine

## 2023-08-08 ENCOUNTER — Other Ambulatory Visit: Payer: Self-pay

## 2023-08-08 ENCOUNTER — Emergency Department (HOSPITAL_COMMUNITY)
Admission: EM | Admit: 2023-08-08 | Discharge: 2023-08-08 | Disposition: A | Discharge status: Home / Self Care | Attending: Emergency Medicine | Admitting: Emergency Medicine

## 2023-08-08 ENCOUNTER — Emergency Department (HOSPITAL_COMMUNITY)

## 2023-08-08 DIAGNOSIS — J439 Emphysema, unspecified: Secondary | ICD-10-CM | POA: Diagnosis not present

## 2023-08-08 DIAGNOSIS — R000 Tachycardia, unspecified: Secondary | ICD-10-CM | POA: Insufficient documentation

## 2023-08-08 DIAGNOSIS — Z79899 Other long term (current) drug therapy: Secondary | ICD-10-CM | POA: Insufficient documentation

## 2023-08-08 DIAGNOSIS — Z7901 Long term (current) use of anticoagulants: Secondary | ICD-10-CM | POA: Diagnosis not present

## 2023-08-08 DIAGNOSIS — R058 Other specified cough: Secondary | ICD-10-CM | POA: Diagnosis not present

## 2023-08-08 DIAGNOSIS — J441 Chronic obstructive pulmonary disease with (acute) exacerbation: Secondary | ICD-10-CM | POA: Insufficient documentation

## 2023-08-08 DIAGNOSIS — D72829 Elevated white blood cell count, unspecified: Secondary | ICD-10-CM | POA: Diagnosis not present

## 2023-08-08 DIAGNOSIS — R0602 Shortness of breath: Secondary | ICD-10-CM | POA: Diagnosis present

## 2023-08-08 LAB — BASIC METABOLIC PANEL
Anion gap: 11 (ref 5–15)
BUN: 11 mg/dL (ref 8–23)
CO2: 25 mmol/L (ref 22–32)
Calcium: 9.7 mg/dL (ref 8.9–10.3)
Chloride: 101 mmol/L (ref 98–111)
Creatinine, Ser: 0.65 mg/dL (ref 0.61–1.24)
GFR, Estimated: >60 mL/min (ref >60)
Glucose, Bld: 102 mg/dL — ABNORMAL HIGH (ref 70–99)
Potassium: 4.2 mmol/L (ref 3.5–5.1)
Sodium: 137 mmol/L (ref 135–145)

## 2023-08-08 LAB — RESP PANEL BY RT-PCR (RSV, FLU A&B, COVID)  RVPGX2
Influenza A by PCR: NEGATIVE
Influenza B by PCR: NEGATIVE
Resp Syncytial Virus by PCR: NEGATIVE
SARS Coronavirus 2 by RT PCR: NEGATIVE

## 2023-08-08 LAB — CBC
HCT: 44.5 % (ref 39.0–52.0)
Hemoglobin: 14.8 g/dL (ref 13.0–17.0)
MCH: 31 pg (ref 26.0–34.0)
MCHC: 33.3 g/dL (ref 30.0–36.0)
MCV: 93.1 fL (ref 80.0–100.0)
Platelets: 195 10*3/uL (ref 150–400)
RBC: 4.78 MIL/uL (ref 4.22–5.81)
RDW: 12.3 % (ref 11.5–15.5)
WBC: 11.7 10*3/uL — ABNORMAL HIGH (ref 4.0–10.5)
nRBC: 0 % (ref 0.0–0.2)

## 2023-08-08 MED ORDER — DOXYCYCLINE HYCLATE 100 MG PO CAPS: 100.0000 mg | ORAL_CAPSULE | Freq: Two times a day (BID) | ORAL | 0 refills | Status: DC

## 2023-08-08 MED ORDER — LOSARTAN POTASSIUM 25 MG PO TABS: 100.0000 mg | ORAL_TABLET | ORAL | Status: DC

## 2023-08-08 MED ORDER — PREDNISONE 20 MG PO TABS: 40.0000 mg | ORAL_TABLET | Freq: Every day | ORAL | 0 refills | Status: DC

## 2023-08-08 MED ORDER — HYDRALAZINE HCL 20 MG/ML IJ SOLN: 10.0000 mg | INTRAMUSCULAR | Status: DC

## 2023-08-08 MED ORDER — ALBUTEROL SULFATE HFA 108 (90 BASE) MCG/ACT IN AERS: 2.0000 | INHALATION_SPRAY | RESPIRATORY_TRACT | Status: DC | PRN

## 2023-08-08 MED ORDER — ALBUTEROL SULFATE (2.5 MG/3ML) 0.083% IN NEBU
10.0000 mg | INHALATION_SOLUTION | RESPIRATORY_TRACT | Status: AC
  Administered 2023-08-08: 10 mg via RESPIRATORY_TRACT
  Filled 2023-08-08: qty 12

## 2023-08-08 MED ORDER — METHYLPREDNISOLONE SODIUM SUCC 125 MG IJ SOLR
125.0000 mg | Freq: Once | INTRAMUSCULAR | Status: AC
  Administered 2023-08-08: 125 mg via INTRAVENOUS
  Filled 2023-08-08: qty 2

## 2023-08-08 NOTE — ED Triage Notes (Signed)
 Pt c/o sob x3 weeks, has been doing neb treatments at home with minimal relief.  Hx COPD

## 2023-08-08 NOTE — Discharge Instructions (Addendum)
 I would like for you to take the following medications exactly as prescribed to help treat your shortness of breath  Albuterol is an inhaled medication which can help you to breathe better, you should take 2 puffs every 4 hours as needed, this may cause your heart to feel like it is racing, this should be a temporary side effect.   Prednisone is a steroid that helps to reduce certain types of inflammation and may be used for allergic reactions, some rashes such as poison ivy or dermatitis, for asthma attacks or bronchitis and for certain types of pain.  Please take this medicine exactly as prescribed - 40mg  by mouth daily for 5 days.  This can have certain side effects with some people including feeling like you can't sleep, feeling anxious or feeling like you are on a "high".  It should not cause weight gain if only taken for a short time.  Please be aware that this medication may also cause an elevation in your blood sugar if you are a diabetic so if you are a diabetic you will need to keep a very close eye on your blood sugar, make sure that you are eating an extremely low level of carbohydrates and taking your medications exactly as prescribed.  If you should develop severe high blood sugar or start to feel poorly return to the emergency department immediately   Doxycycline is an antibiotic which is taken twice a day, this treats bacterial infections that can cause staph infections, it treats sinus infections and some pneumonia. It also treats tick infections like Lyme's disease.  In this case I would like for you to take the antibiotic exactly as prescribed until it is completed.  Please be aware that occasionally people will get a rash if they are in the sunlight for extended periods of time while taking this medicine.  Thank you for allowing [place] to treat you in the emergency department today.  After reviewing your examination and potential testing that was done it appears that you are safe to go home.   I would like for you to follow-up with your doctor within the next several days, have them obtain your records and follow-up with them to review all potential tests and results from your visit.  If you should develop severe or worsening symptoms return to the emergency department immediately

## 2023-08-08 NOTE — ED Provider Notes (Signed)
 [place] EMERGENCY DEPARTMENT AT [facility] Provider Note   CSN: [id number] Arrival date & time: 08/08/23  2108     History  Chief Complaint  Patient presents with   Shortness of Breath    Antonio Baker is a 68 y.o. male.   Shortness of Breath  This patient is a 68 year old male, he has a history of COPD but states that he does not use medications very regularly in fact he hardly ever uses his albuterol.  Over 3 weeks he has had a gradual progression of shortness of breath, he is currently on warfarin, he states that his shortness of breath is associated with cough the last 2 days but no fevers or chills, he has not had any swelling of his legs.  He has not been around anybody who has been sick.  He did take a nebulizer treatment prior to her the paramedics being called and states he felt better for 2 hours but then it got worse.  He does have an oxygen machine at home but never uses it    Home Medications Prior to Admission medications   Medication Sig Start Date End Date Taking? Authorizing Provider  doxycycline (VIBRAMYCIN) 100 MG capsule Take 1 capsule (100 mg total) by mouth 2 (two) times daily. 08/08/23  Yes Eber Hong, MD  predniSONE (DELTASONE) 20 MG tablet Take 2 tablets (40 mg total) by mouth daily. 08/08/23  Yes Eber Hong, MD  albuterol (PROVENTIL) (5 MG/ML) 0.5% nebulizer solution Take 0.5 mLs (2.5 mg total) by nebulization every 6 (six) hours as needed for wheezing or shortness of breath. 05/07/22   Bethann Berkshire, MD  allopurinol (ZYLOPRIM) 300 MG tablet Take 300 mg by mouth daily.     [provider]  APPLE CIDER VINEGAR PO Take 45 mLs by mouth daily.     [provider]  carvedilol (COREG) 6.25 MG tablet Take 1 tablet (6.25 mg total) by mouth 2 (two) times daily with a meal. 07/15/23   Mallipeddi, Vishnu P, MD  Cyanocobalamin (B-12 PO) Take 2,500 mg by mouth daily.     [provider]  hydrochlorothiazide (HYDRODIURIL) 25 MG  tablet Take 25 mg by mouth daily. 05/05/22   [provider]  losartan (COZAAR) 100 MG tablet Take 100 mg by mouth daily. 04/20/22   [provider]  nitroGLYCERIN (NITROSTAT) 0.4 MG SL tablet Place 1 tablet (0.4 mg total) under the tongue every 5 (five) minutes as needed for chest pain (CP or SOB). 08/17/19   Bhagat, Sharrell Ku, PA  Omega-3 Fatty Acids (FISH OIL) 1200 MG CAPS Take 2 capsules by mouth 2 (two) times daily.    [provider]  Rivaroxaban (XARELTO) 15 MG TABS tablet Take 15 mg by mouth 2 (two) times daily with a meal.    [provider]  rosuvastatin (CRESTOR) 20 MG tablet TAKE 1 TABLET BY MOUTH EVERY DAY AT 6 PM Patient taking differently: Take 20 mg by mouth daily. 10/08/21   Jonelle Sidle, MD  umeclidinium-vilanterol (ANORO ELLIPTA) 62.5-25 MCG/INH AEPB Inhale 1 puff into the lungs daily as needed (wheezing).    [provider]      Allergies    Patient has no known allergies.    Review of Systems   Review of Systems  Respiratory:  Positive for shortness of breath.   All other systems reviewed and are negative.   Physical Exam Updated Vital Signs BP (!) 157/91   Pulse 93   Temp 99.2  F (37.3 C) (Oral)   Resp 11   SpO2 98%  Physical Exam Vitals and nursing note reviewed.  Constitutional:      General: He is not in acute distress.    Appearance: He is well-developed.  HENT:     Head: Normocephalic and atraumatic.     Mouth/Throat:     Pharynx: No oropharyngeal exudate.  Eyes:     General: No scleral icterus.       Right eye: No discharge.        Left eye: No discharge.     Conjunctiva/sclera: Conjunctivae normal.     Pupils: Pupils are equal, round, and reactive to light.  Neck:     Thyroid: No thyromegaly.     Vascular: No JVD.  Cardiovascular:     Rate and Rhythm: Regular rhythm. Tachycardia present.     Heart sounds: Normal heart sounds. No murmur heard.    No friction rub. No gallop.     Comments:  Pulse of 105, mild tachycardia Pulmonary:     Effort: Pulmonary effort is normal. No respiratory distress.     Breath sounds: Wheezing present. No rales.     Comments: Diffuse expiratory wheezing with prolonged expiratory phase but able to speak in full sentences Abdominal:     General: Bowel sounds are normal. There is no distension.     Palpations: Abdomen is soft. There is no mass.     Tenderness: There is no abdominal tenderness.  Musculoskeletal:        General: No tenderness. Normal range of motion.     Cervical back: Normal range of motion and neck supple.     Right lower leg: No edema.     Left lower leg: No edema.  Lymphadenopathy:     Cervical: No cervical adenopathy.  Skin:    General: Skin is warm and dry.     Findings: No erythema or rash.  Neurological:     Mental Status: He is alert.     Coordination: Coordination normal.  Psychiatric:        Behavior: Behavior normal.     ED Results / Procedures / Treatments   Labs (all labs ordered are listed, but only abnormal results are displayed) Labs Reviewed  BASIC METABOLIC PANEL - Abnormal; Notable for the following components:      Result Value   Glucose, Bld 102 (*)    All other components within normal limits  CBC - Abnormal; Notable for the following components:   WBC 11.7 (*)    All other components within normal limits  RESP PANEL BY RT-PCR (RSV, FLU A&B, COVID)  [id number]    EKG EKG Interpretation Date/Time:  Monday August 08 2023 21:24:52 EDT Ventricular Rate:  100 PR Interval:  158 QRS Duration:  74 QT Interval:  334 QTC Calculation: 430 R Axis:   61  Text Interpretation: Normal sinus rhythm Nonspecific ST abnormality Abnormal ECG When compared with ECG of 15-Jul-2023 14:49, No significant change was found Confirmed by Eber Hong ([id number]) on 08/08/2023 9:31:52 PM  Radiology DG Chest Port 1 View Result Date: 08/08/2023 CLINICAL DATA:  COPD, productive cough EXAM: PORTABLE CHEST 1 VIEW COMPARISON:   05/07/2022 FINDINGS: Single frontal view of the chest demonstrates an unremarkable cardiac silhouette. Background emphysema, without acute airspace disease, effusion, or pneumothorax. No acute bony abnormalities. IMPRESSION: 1. Emphysema.  No acute process. Electronically Signed   By: Sharlet Salina M.D.   On: 08/08/2023 22:06    Procedures Procedures  Medications Ordered in ED Medications  albuterol (VENTOLIN HFA) 108 (90 Base) MCG/ACT inhaler 2 puff (has no administration in time range)  albuterol (PROVENTIL) (2.5 MG/3ML) 0.083% nebulizer solution 10 mg (10 mg Nebulization New Bag/Given 08/08/23 2223)  hydrALAZINE (APRESOLINE) injection 10 mg (has no administration in time range)  losartan (COZAAR) tablet 100 mg (has no administration in time range)  methylPREDNISolone sodium succinate (SOLU-MEDROL) 125 mg/2 mL injection 125 mg (125 mg Intravenous Given 08/08/23 2220)    ED Course/ Medical Decision Making/ A&P                                 Medical Decision Making Amount and/or Complexity of Data Reviewed Labs: ordered. Radiology: ordered.  Risk Prescription drug management.   Patient is chronically ill-appearing with COPD and now has mild acute on chronic hypoxic respiratory failure, oxygen is around 92%, comes up to 94 with deep breathing, diffuse wheezing, I suspect if he gets treatments he will do better, will start with nebs chest x-ray labs and steroids, patient agreeable to the plan   This patient presents to the ED for concern of shortness of breath differential diagnosis includes COPD exacerbation, pneumonia, pneumothorax, effusion, underlying flu or COVID    Additional history obtained:  Additional history obtained from patient and the medical record External records from outside source obtained and reviewed including prior visits to the ED for COPD a couple of years ago, followed in the outpatient setting by cardiology for his hypertension   Lab Tests:  I  Ordered, and personally interpreted labs.  The pertinent results include: Leukocytosis of 11,700, metabolic panel is normal,   Imaging Studies ordered:  I ordered imaging studies including chest x-ray I independently visualized and interpreted imaging which showed emphysema but no acute findings I agree with the radiologist interpretation   Medicines ordered and prescription drug management:  I ordered medication including albuterol continuous nebulizer as well as steroids for shortness of breath and respiratory distress.  The patient had his blood pressure medications ordered however his blood pressure spontaneously improved down to 157/91.  As he received his nebulized treatments and steroids and they started to work he started to speak in longer sentences, his respiratory distress became much less and his oxygen came up to about 95% on room air.  He is now less tachypneic and feeling better and states that he wants to go home.  I think this is reasonable.  He has a fresh prescription for the albuterol nebulizer refills and I will give him prednisone and doxycycline, he understands indications for return and is very agreeable I have reviewed the patients home medicines and have made adjustments as needed The patient has an oxygen machine at home   Problem List / ED Course:  Patient agreeable to oxygen use with his other meds   Social Determinants of Health:  Chronic emphysema           Final Clinical Impression(s) / ED Diagnoses Final diagnoses:  COPD exacerbation (HCC)    Rx / DC Orders ED Discharge Orders          Ordered    predniSONE (DELTASONE) 20 MG tablet  Daily        08/08/23 2315    doxycycline (VIBRAMYCIN) 100 MG capsule  2 times daily        08/08/23 2315              Hyacinth Meeker,  Arlys John, MD 08/08/23 2315

## 2023-08-31 DIAGNOSIS — L0390 Cellulitis, unspecified: Secondary | ICD-10-CM | POA: Diagnosis not present

## 2023-08-31 DIAGNOSIS — Z713 Dietary counseling and surveillance: Secondary | ICD-10-CM | POA: Diagnosis not present

## 2023-08-31 DIAGNOSIS — I10 Essential (primary) hypertension: Secondary | ICD-10-CM | POA: Diagnosis not present

## 2023-08-31 DIAGNOSIS — R350 Frequency of micturition: Secondary | ICD-10-CM | POA: Diagnosis not present

## 2023-08-31 DIAGNOSIS — E782 Mixed hyperlipidemia: Secondary | ICD-10-CM | POA: Diagnosis not present

## 2023-08-31 DIAGNOSIS — J449 Chronic obstructive pulmonary disease, unspecified: Secondary | ICD-10-CM | POA: Diagnosis not present

## 2023-08-31 DIAGNOSIS — G6289 Other specified polyneuropathies: Secondary | ICD-10-CM | POA: Diagnosis not present

## 2023-08-31 DIAGNOSIS — G894 Chronic pain syndrome: Secondary | ICD-10-CM | POA: Diagnosis not present

## 2023-08-31 DIAGNOSIS — B351 Tinea unguium: Secondary | ICD-10-CM | POA: Diagnosis not present

## 2023-08-31 DIAGNOSIS — Z7182 Exercise counseling: Secondary | ICD-10-CM | POA: Diagnosis not present

## 2023-11-02 DIAGNOSIS — M79671 Pain in right foot: Secondary | ICD-10-CM | POA: Diagnosis not present

## 2023-11-02 DIAGNOSIS — M79674 Pain in right toe(s): Secondary | ICD-10-CM | POA: Diagnosis not present

## 2023-11-02 DIAGNOSIS — I739 Peripheral vascular disease, unspecified: Secondary | ICD-10-CM | POA: Diagnosis not present

## 2023-11-02 DIAGNOSIS — L89892 Pressure ulcer of other site, stage 2: Secondary | ICD-10-CM | POA: Diagnosis not present

## 2023-11-02 DIAGNOSIS — L110 Acquired keratosis follicularis: Secondary | ICD-10-CM | POA: Diagnosis not present

## 2023-11-16 DIAGNOSIS — M79671 Pain in right foot: Secondary | ICD-10-CM | POA: Diagnosis not present

## 2023-11-16 DIAGNOSIS — L110 Acquired keratosis follicularis: Secondary | ICD-10-CM | POA: Diagnosis not present

## 2023-11-16 DIAGNOSIS — I739 Peripheral vascular disease, unspecified: Secondary | ICD-10-CM | POA: Diagnosis not present

## 2023-11-16 DIAGNOSIS — M79674 Pain in right toe(s): Secondary | ICD-10-CM | POA: Diagnosis not present

## 2023-11-16 DIAGNOSIS — L89892 Pressure ulcer of other site, stage 2: Secondary | ICD-10-CM | POA: Diagnosis not present

## 2023-12-07 DIAGNOSIS — E785 Hyperlipidemia, unspecified: Secondary | ICD-10-CM | POA: Diagnosis not present

## 2023-12-07 DIAGNOSIS — L89810 Pressure ulcer of head, unstageable: Secondary | ICD-10-CM | POA: Diagnosis not present

## 2023-12-07 DIAGNOSIS — L89892 Pressure ulcer of other site, stage 2: Secondary | ICD-10-CM | POA: Diagnosis not present

## 2023-12-07 DIAGNOSIS — M79671 Pain in right foot: Secondary | ICD-10-CM | POA: Diagnosis not present

## 2023-12-07 DIAGNOSIS — L110 Acquired keratosis follicularis: Secondary | ICD-10-CM | POA: Diagnosis not present

## 2023-12-07 DIAGNOSIS — I739 Peripheral vascular disease, unspecified: Secondary | ICD-10-CM | POA: Diagnosis not present

## 2023-12-07 DIAGNOSIS — M79674 Pain in right toe(s): Secondary | ICD-10-CM | POA: Diagnosis not present

## 2023-12-21 DIAGNOSIS — M79671 Pain in right foot: Secondary | ICD-10-CM | POA: Diagnosis not present

## 2023-12-21 DIAGNOSIS — I739 Peripheral vascular disease, unspecified: Secondary | ICD-10-CM | POA: Diagnosis not present

## 2023-12-21 DIAGNOSIS — L89892 Pressure ulcer of other site, stage 2: Secondary | ICD-10-CM | POA: Diagnosis not present

## 2023-12-21 DIAGNOSIS — L110 Acquired keratosis follicularis: Secondary | ICD-10-CM | POA: Diagnosis not present

## 2023-12-21 DIAGNOSIS — M79674 Pain in right toe(s): Secondary | ICD-10-CM | POA: Diagnosis not present

## 2024-01-11 DIAGNOSIS — L89892 Pressure ulcer of other site, stage 2: Secondary | ICD-10-CM | POA: Diagnosis not present

## 2024-01-11 DIAGNOSIS — M79674 Pain in right toe(s): Secondary | ICD-10-CM | POA: Diagnosis not present

## 2024-01-11 DIAGNOSIS — I739 Peripheral vascular disease, unspecified: Secondary | ICD-10-CM | POA: Diagnosis not present

## 2024-01-11 DIAGNOSIS — M79671 Pain in right foot: Secondary | ICD-10-CM | POA: Diagnosis not present

## 2024-01-11 DIAGNOSIS — L110 Acquired keratosis follicularis: Secondary | ICD-10-CM | POA: Diagnosis not present

## 2024-01-31 DIAGNOSIS — M79671 Pain in right foot: Secondary | ICD-10-CM | POA: Diagnosis not present

## 2024-01-31 DIAGNOSIS — L89892 Pressure ulcer of other site, stage 2: Secondary | ICD-10-CM | POA: Diagnosis not present

## 2024-01-31 DIAGNOSIS — I739 Peripheral vascular disease, unspecified: Secondary | ICD-10-CM | POA: Diagnosis not present

## 2024-01-31 DIAGNOSIS — L110 Acquired keratosis follicularis: Secondary | ICD-10-CM | POA: Diagnosis not present

## 2024-01-31 DIAGNOSIS — M79674 Pain in right toe(s): Secondary | ICD-10-CM | POA: Diagnosis not present

## 2024-04-10 ENCOUNTER — Ambulatory Visit

## 2024-04-10 ENCOUNTER — Ambulatory Visit (INDEPENDENT_AMBULATORY_CARE_PROVIDER_SITE_OTHER)

## 2024-04-10 DIAGNOSIS — L97511 Non-pressure chronic ulcer of other part of right foot limited to breakdown of skin: Secondary | ICD-10-CM | POA: Diagnosis not present

## 2024-04-10 NOTE — Progress Notes (Signed)
 "  Subjective:  Patient ID: Antonio Baker, male    DOB: 07/10/1955,  MRN: [id number]  Chief Complaint  Patient presents with   Foot Ulcer    [id number] Right foot ulceration/ neuropathy/not diabetic/ soaks/ dressing changes.    Discussed the use of AI scribe software for clinical note transcription with the patient, who gave verbal consent to proceed.  History of Present Illness Antonio Baker is a 68 year old male with neuropathy who presents with a superficial ulceration on the plantar right first submetatarsal head.  The ulceration on the plantar aspect of the right foot, under the first submetatarsal head, has been present since June. It initially appeared to heal but recurred with bleeding noted on the sock this morning. Cream and bandages have been used, improving but not fully healing the ulcer. There is no pain upon touch and no signs of infection such as purulence.  Neuropathy affects his feet and hands, worsening in the morning. He is not diabetic. He uses a surgical shoe to manage pressure on the ulcerated area but does not wear it around the house, attempting to keep his foot straight.  He has previously seen Dr. Fernande for this and was referred to our office for second opinion.      Review of Systems: Negative except as noted in the HPI. Denies N/V/F/Ch.  Past Medical History:  Diagnosis Date   Asthma    Chronic pain    COPD (chronic obstructive pulmonary disease) (HCC)    Coronary atherosclerosis    Mild to moderate by cardiac catheterization March 2021   Degenerative disc disease, lumbar    Essential hypertension    Gout    History of DVT (deep vein thrombosis)    History of pulmonary embolus (PE) 2016   Hyperlipidemia    Hypertension    Neuropathy    Peripheral neuropathic pain    Pulmonary embolism (HCC)     Current Outpatient Medications:    albuterol  (PROVENTIL ) (5 MG/ML) 0.5% nebulizer solution, Take 0.5 mLs (2.5 mg total) by nebulization every 6 (six) hours as  needed for wheezing or shortness of breath., Disp: 20 mL, Rfl: 12   allopurinol  (ZYLOPRIM ) 300 MG tablet, Take 300 mg by mouth daily. , Disp: , Rfl:    APPLE CIDER VINEGAR PO, Take 45 mLs by mouth daily. , Disp: , Rfl:    carvedilol  (COREG ) 6.25 MG tablet, Take 1 tablet (6.25 mg total) by mouth 2 (two) times daily with a meal., Disp: 180 tablet, Rfl: 3   Cyanocobalamin (B-12 PO), Take 2,500 mg by mouth daily. , Disp: , Rfl:    doxycycline  (VIBRAMYCIN ) 100 MG capsule, Take 1 capsule (100 mg total) by mouth 2 (two) times daily., Disp: 20 capsule, Rfl: 0   hydrochlorothiazide  (HYDRODIURIL ) 25 MG tablet, Take 25 mg by mouth daily., Disp: , Rfl:    losartan  (COZAAR ) 100 MG tablet, Take 100 mg by mouth daily., Disp: , Rfl:    nitroGLYCERIN  (NITROSTAT ) 0.4 MG SL tablet, Place 1 tablet (0.4 mg total) under the tongue every 5 (five) minutes as needed for chest pain (CP or SOB)., Disp: 25 tablet, Rfl: 12   Omega-3 Fatty Acids (FISH OIL) 1200 MG CAPS, Take 2 capsules by mouth 2 (two) times daily., Disp: , Rfl:    predniSONE  (DELTASONE ) 20 MG tablet, Take 2 tablets (40 mg total) by mouth daily., Disp: 10 tablet, Rfl: 0   Rivaroxaban  (XARELTO ) 15 MG TABS tablet, Take 15 mg by mouth 2 (two)  times daily with a meal., Disp: , Rfl:    rosuvastatin  (CRESTOR ) 20 MG tablet, TAKE 1 TABLET BY MOUTH EVERY DAY AT 6 PM (Patient taking differently: Take 20 mg by mouth daily.), Disp: 7 tablet, Rfl: 0   umeclidinium-vilanterol (ANORO ELLIPTA) 62.5-25 MCG/INH AEPB, Inhale 1 puff into the lungs daily as needed (wheezing)., Disp: , Rfl:   Social History   Tobacco Use  Smoking Status Former   Current packs/day: 0.00   Average packs/day: 2.0 packs/day for 20.0 years (40.0 ttl pk-yrs)   Types: Cigarettes   Start date: 05/16/1979   Quit date: 05/16/1999   Years since quitting: 24.9  Smokeless Tobacco Never    No Known Allergies Objective:    Physical Exam EXTREMITIES: Superficial ulceration at plantar right first  submetatarsal head. Partial thickness, does not extend beyond hypodermis. No pain. No erythema, edema, or signs of infection. Minimal sesamoid prominence underlying. Shoe shows wear indicating pressure to plantar first MTP in surgical shoe. Faintly palpable pulses.    Radiographs: 3 weightbearing views were acquired. These do not show any cortical erosions, periosteal reactions, or other signs of osteomyelitis or aggressive infection. 1st MTP arthritis present. No other acute osseous findings.   Assessment:   1. Ulcerated, foot, right, limited to breakdown of skin [facility])      Plan:  Patient was evaluated and treated and all questions answered.  Assessment and Plan Assessment & Plan Chronic superficial ulceration of plantar right first submetatarsal head Chronic ulceration with significant pressure at plantar first MTP joint. Neuropathy present, no infection or diabetes. Non-surgical interventions preferred. - Continue ointment and Band-Aid application. - Provided pads to relieve pressure. - Patient to check insurance coverage for orthotics. - Plan orthotics fitting if pads effective. Would require custom offloading orthotics for his 1st MTP. Cutout underneath 1st MTP.  He may get these from Dr. Fernande if he offers them. - Consider surgical options if orthotics not feasible.   RTC 4 weeks  Prentice Ovens, DPM AACFAS Fellowship Trained Podiatric Surgeon [facility]  "

## 2024-04-11 ENCOUNTER — Emergency Department (HOSPITAL_COMMUNITY)

## 2024-04-11 ENCOUNTER — Encounter (HOSPITAL_COMMUNITY): Payer: Self-pay | Admitting: *Deleted

## 2024-04-11 ENCOUNTER — Emergency Department (HOSPITAL_COMMUNITY)
Admission: EM | Admit: 2024-04-11 | Discharge: 2024-04-11 | Disposition: A | Discharge status: Home / Self Care | Attending: Emergency Medicine | Admitting: Emergency Medicine

## 2024-04-11 ENCOUNTER — Other Ambulatory Visit: Payer: Self-pay

## 2024-04-11 DIAGNOSIS — Z87891 Personal history of nicotine dependence: Secondary | ICD-10-CM | POA: Diagnosis not present

## 2024-04-11 DIAGNOSIS — J441 Chronic obstructive pulmonary disease with (acute) exacerbation: Secondary | ICD-10-CM | POA: Insufficient documentation

## 2024-04-11 DIAGNOSIS — R0602 Shortness of breath: Secondary | ICD-10-CM | POA: Diagnosis present

## 2024-04-11 DIAGNOSIS — Z7901 Long term (current) use of anticoagulants: Secondary | ICD-10-CM | POA: Diagnosis not present

## 2024-04-11 LAB — BASIC METABOLIC PANEL WITH GFR
Anion gap: 13 (ref 5–15)
BUN: 12 mg/dL (ref 8–23)
CO2: 24 mmol/L (ref 22–32)
Calcium: 9.3 mg/dL (ref 8.9–10.3)
Chloride: 102 mmol/L (ref 98–111)
Creatinine, Ser: 0.59 mg/dL — ABNORMAL LOW (ref 0.61–1.24)
GFR, Estimated: >60 mL/min (ref >60)
Glucose, Bld: 100 mg/dL — ABNORMAL HIGH (ref 70–99)
Potassium: 3.9 mmol/L (ref 3.5–5.1)
Sodium: 139 mmol/L (ref 135–145)

## 2024-04-11 LAB — CBC
HCT: 44.1 % (ref 39.0–52.0)
Hemoglobin: 15.1 g/dL (ref 13.0–17.0)
MCH: 31.9 pg (ref 26.0–34.0)
MCHC: 34.2 g/dL (ref 30.0–36.0)
MCV: 93 fL (ref 80.0–100.0)
Platelets: 208 K/uL (ref 150–400)
RBC: 4.74 MIL/uL (ref 4.22–5.81)
RDW: 13.7 % (ref 11.5–15.5)
WBC: 11.6 K/uL — ABNORMAL HIGH (ref 4.0–10.5)
nRBC: 0 % (ref 0.0–0.2)

## 2024-04-11 LAB — RESP PANEL BY RT-PCR (RSV, FLU A&B, COVID)  RVPGX2
Influenza A by PCR: NEGATIVE
Influenza B by PCR: NEGATIVE
Resp Syncytial Virus by PCR: NEGATIVE
SARS Coronavirus 2 by RT PCR: NEGATIVE

## 2024-04-11 MED ORDER — MAGNESIUM SULFATE 2 GM/50ML IV SOLN
2.0000 g | Freq: Once | INTRAVENOUS | Status: AC
  Administered 2024-04-11: 2 g via INTRAVENOUS
  Filled 2024-04-11: qty 50

## 2024-04-11 MED ORDER — PREDNISONE 10 MG PO TABS: 60.0000 mg | ORAL_TABLET | Freq: Every day | ORAL | 0 refills | Status: AC

## 2024-04-11 MED ORDER — IPRATROPIUM-ALBUTEROL 0.5-2.5 (3) MG/3ML IN SOLN
3.0000 mL | Freq: Three times a day (TID) | RESPIRATORY_TRACT | Status: DC | PRN
  Administered 2024-04-11 (×3): 3 mL via RESPIRATORY_TRACT
  Filled 2024-04-11: qty 9

## 2024-04-11 MED ORDER — METHYLPREDNISOLONE SODIUM SUCC 125 MG IJ SOLR
125.0000 mg | Freq: Once | INTRAMUSCULAR | Status: AC
  Administered 2024-04-11: 125 mg via INTRAVENOUS
  Filled 2024-04-11: qty 2

## 2024-04-11 NOTE — ED Triage Notes (Signed)
 Pt with c/o SOB since weekend, +productive cough that's clear in color per pt. Denies any chest pain. Denies fevers but +chills, denies N/V

## 2024-04-11 NOTE — ED Provider Notes (Signed)
 [place] EMERGENCY DEPARTMENT AT [facility] Provider Note   CSN: [id number] Arrival date & time: 04/11/24  1048     Patient presents with: Shortness of Breath   Antonio Baker is a 68 y.o. male here with SOB, coughing, ongoing for 3 days.  Hx of COPD, quit smoking 20 years ago, not on oxygen  at home.   HPI     Prior to Admission medications   Medication Sig Start Date End Date Taking? Authorizing Provider  albuterol  (PROVENTIL ) (5 MG/ML) 0.5% nebulizer solution Take 0.5 mLs (2.5 mg total) by nebulization every 6 (six) hours as needed for wheezing or shortness of breath. 05/07/22  Yes Suzette Pac, MD  allopurinol  (ZYLOPRIM ) 300 MG tablet Take 300 mg by mouth daily.    Yes [provider]  APPLE CIDER VINEGAR PO Take 45 mLs by mouth daily.    Yes [provider]  carvedilol  (COREG ) 6.25 MG tablet Take 1 tablet (6.25 mg total) by mouth 2 (two) times daily with a meal. 07/15/23  Yes Mallipeddi, Vishnu P, MD  Cyanocobalamin (B-12 PO) Take 2,500 mg by mouth daily.    Yes [provider]  hydrochlorothiazide  (HYDRODIURIL ) 25 MG tablet Take 25 mg by mouth daily. 05/05/22  Yes [provider]  nitroGLYCERIN  (NITROSTAT ) 0.4 MG SL tablet Place 1 tablet (0.4 mg total) under the tongue every 5 (five) minutes as needed for chest pain (CP or SOB). 08/17/19  Yes Bhagat, Bhavinkumar, PA  predniSONE  (DELTASONE ) 10 MG tablet Take 6 tablets (60 mg total) by mouth daily for 4 days. 04/12/24 04/16/24 Yes Chi Garlow, Donnice PARAS, MD  rosuvastatin  (CRESTOR ) 20 MG tablet TAKE 1 TABLET BY MOUTH EVERY DAY AT 6 PM Patient taking differently: Take 20 mg by mouth daily. 10/08/21  Yes Debera Jayson MATSU, MD  warfarin (COUMADIN) 5 MG tablet Take 5 mg by mouth daily. 03/26/24  Yes [provider]    Allergies: Patient has no known allergies.    Review of Systems  Updated Vital Signs BP (!) 156/90   Pulse 80   Temp 97.7 F (36.5 C) (Oral)   Resp 15   Ht 6'  2 (1.88 m)   Wt 104.3 kg   SpO2 93%   BMI 29.53 kg/m   Physical Exam Constitutional:      General: He is not in acute distress. HENT:     Head: Normocephalic and atraumatic.  Eyes:     Conjunctiva/sclera: Conjunctivae normal.     Pupils: Pupils are equal, round, and reactive to light.  Cardiovascular:     Rate and Rhythm: Normal rate and regular rhythm.  Pulmonary:     Effort: Pulmonary effort is normal. No respiratory distress.     Breath sounds: Wheezing present.  Abdominal:     General: There is no distension.     Tenderness: There is no abdominal tenderness.  Skin:    General: Skin is warm and dry.  Neurological:     General: No focal deficit present.     Mental Status: He is alert. Mental status is at baseline.  Psychiatric:        Mood and Affect: Mood normal.        Behavior: Behavior normal.     (all labs ordered are listed, but only abnormal results are displayed) Labs Reviewed  BASIC METABOLIC PANEL WITH GFR - Abnormal; Notable for the following components:      Result Value   Glucose, Bld 100 (*)    Creatinine,  Ser 0.59 (*)    All other components within normal limits  CBC - Abnormal; Notable for the following components:   WBC 11.6 (*)    All other components within normal limits  RESP PANEL BY RT-PCR (RSV, FLU A&B, COVID)  [id number]    EKG: EKG Interpretation Date/Time:  Wednesday April 11 2024 11:06:55 EST Ventricular Rate:  90 PR Interval:  156 QRS Duration:  78 QT Interval:  372 QTC Calculation: 455 R Axis:   57  Text Interpretation: Sinus rhythm with occasional Premature ventricular complexes Otherwise normal ECG When compared with ECG of 08-Aug-2023 21:24, Premature ventricular complexes are now Present Confirmed by Cottie Cough [phone]) on 04/11/2024 11:19:58 AM  Radiology: [facility] Chest Portable 1 View Result Date: 04/11/2024 CLINICAL DATA:  Shortness of breath with productive cough. EXAM: PORTABLE CHEST 1 VIEW COMPARISON:  08/08/2023  FINDINGS: Lordotic technique is demonstrated. Lungs are adequately inflated without acute lobar consolidation or effusion. Mild stable interstitial prominence. Cardiomediastinal silhouette and remainder of the exam is unchanged. IMPRESSION: No acute cardiopulmonary disease. Electronically Signed   By: Toribio Agreste M.D.   On: 04/11/2024 11:43     Procedures   Medications Ordered in the ED  ipratropium-albuterol  (DUONEB) 0.5-2.5 (3) MG/3ML nebulizer solution 3 mL (3 mLs Nebulization Given 04/11/24 1155)  methylPREDNISolone  sodium succinate (SOLU-MEDROL ) 125 mg/2 mL injection 125 mg (125 mg Intravenous Given 04/11/24 1149)  magnesium sulfate IVPB 2 g 50 mL (0 g Intravenous Stopped 04/11/24 1334)                                    Medical Decision Making Amount and/or Complexity of Data Reviewed Labs: ordered. Radiology: ordered.  Risk Prescription drug management.   This patient presents to the ED with concern for SOB. This involves an extensive number of treatment options, and is a complaint that carries with it a high risk of complications and morbidity.  The differential diagnosis includes COPD vs PNA vs viral URI including COVID 19 vs other  Co-morbidities that complicate the patient evaluation: hx of COPD   I ordered and personally interpreted labs.  The pertinent results include:  no emergent findings  I ordered imaging studies including dg chest I independently visualized and interpreted imaging which showed no emergent findings I agree with the radiologist interpretation  The patient was maintained on a cardiac monitor.  I personally viewed and interpreted the cardiac monitored which showed an underlying rhythm of: NSR  Per my interpretation the patient's ECG shows no acute ischemia  I ordered medication including COPD treatment  I have reviewed the patients home medicines and have made adjustments as needed  Test Considered: doubt sepsis, ACS, acute PE  After the  interventions noted above, I reevaluated the patient and found that they have: improved - wheezing resolved and patient feeling much better   Dispostion:  After consideration of the diagnostic results and the patients response to treatment, I feel that the patent would benefit from PCP follow up.      Final diagnoses:  COPD exacerbation [facility])    ED Discharge Orders          Ordered    predniSONE  (DELTASONE ) 10 MG tablet  Daily        04/11/24 1402               Cottie Cough PARAS, MD 04/11/24 1724

## 2024-04-11 NOTE — Discharge Instructions (Addendum)
 For the next 2 days give yourself an albuterol  breathing treatment with the nebulizer machine once every 4 hours while awake in the daytime.  Then go back to using it as needed every 4-6 hours for wheezing.

## 2024-04-11 NOTE — ED Notes (Signed)
 Significant improvement noted. No resp distress noted. Pt has no complaints currently.

## 2024-05-01 ENCOUNTER — Emergency Department (HOSPITAL_COMMUNITY)
Admission: EM | Admit: 2024-05-01 | Discharge: 2024-05-01 | Disposition: A | Discharge status: Home / Self Care | Attending: Emergency Medicine | Admitting: Emergency Medicine

## 2024-05-01 ENCOUNTER — Encounter (HOSPITAL_COMMUNITY): Payer: Self-pay | Admitting: *Deleted

## 2024-05-01 ENCOUNTER — Other Ambulatory Visit: Payer: Self-pay

## 2024-05-01 ENCOUNTER — Emergency Department (HOSPITAL_COMMUNITY)

## 2024-05-01 DIAGNOSIS — J441 Chronic obstructive pulmonary disease with (acute) exacerbation: Secondary | ICD-10-CM

## 2024-05-01 LAB — BASIC METABOLIC PANEL WITH GFR
Anion gap: 15 (ref 5–15)
BUN: 12 mg/dL (ref 8–23)
CO2: 23 mmol/L (ref 22–32)
Calcium: 9.5 mg/dL (ref 8.9–10.3)
Chloride: 101 mmol/L (ref 98–111)
Creatinine, Ser: 0.67 mg/dL (ref 0.61–1.24)
GFR, Estimated: >60 mL/min (ref >60)
Glucose, Bld: 103 mg/dL — ABNORMAL HIGH (ref 70–99)
Potassium: 3.6 mmol/L (ref 3.5–5.1)
Sodium: 138 mmol/L (ref 135–145)

## 2024-05-01 LAB — RESP PANEL BY RT-PCR (RSV, FLU A&B, COVID)  RVPGX2
Influenza A by PCR: NEGATIVE
Influenza B by PCR: NEGATIVE
Resp Syncytial Virus by PCR: NEGATIVE
SARS Coronavirus 2 by RT PCR: NEGATIVE

## 2024-05-01 LAB — CBC WITH DIFFERENTIAL/PLATELET
Abs Immature Granulocytes: 0.04 K/uL (ref 0.00–0.07)
Basophils Absolute: 0.1 K/uL (ref 0.0–0.1)
Basophils Relative: 1 %
Eosinophils Absolute: 0.7 K/uL — ABNORMAL HIGH (ref 0.0–0.5)
Eosinophils Relative: 7 %
HCT: 45.1 % (ref 39.0–52.0)
Hemoglobin: 15.3 g/dL (ref 13.0–17.0)
Immature Granulocytes: 0 %
Lymphocytes Relative: 19 %
Lymphs Abs: 1.9 K/uL (ref 0.7–4.0)
MCH: 31.8 pg (ref 26.0–34.0)
MCHC: 33.9 g/dL (ref 30.0–36.0)
MCV: 93.8 fL (ref 80.0–100.0)
Monocytes Absolute: 0.9 K/uL (ref 0.1–1.0)
Monocytes Relative: 9 %
Neutro Abs: 6.3 K/uL (ref 1.7–7.7)
Neutrophils Relative %: 64 %
Platelets: 191 K/uL (ref 150–400)
RBC: 4.81 MIL/uL (ref 4.22–5.81)
RDW: 13.3 % (ref 11.5–15.5)
WBC: 9.9 K/uL (ref 4.0–10.5)
nRBC: 0 % (ref 0.0–0.2)

## 2024-05-01 LAB — TROPONIN T, HIGH SENSITIVITY
Troponin T High Sensitivity: 23 ng/L — ABNORMAL HIGH (ref 0–19)
Troponin T High Sensitivity: 21 ng/L — ABNORMAL HIGH (ref 0–19)

## 2024-05-01 MED ORDER — IPRATROPIUM-ALBUTEROL 0.5-2.5 (3) MG/3ML IN SOLN
3.0000 mL | Freq: Once | RESPIRATORY_TRACT | Status: AC
  Administered 2024-05-01: 3 mL via RESPIRATORY_TRACT
  Filled 2024-05-01: qty 3

## 2024-05-01 MED ORDER — DOXYCYCLINE HYCLATE 100 MG PO CAPS: 100.0000 mg | ORAL_CAPSULE | Freq: Two times a day (BID) | ORAL | 0 refills | Status: DC

## 2024-05-01 MED ORDER — IPRATROPIUM BROMIDE 0.02 % IN SOLN: 0.5000 mg | Freq: Four times a day (QID) | RESPIRATORY_TRACT | 12 refills | Status: AC

## 2024-05-01 MED ORDER — PREDNISONE 10 MG (21) PO TBPK: ORAL_TABLET | Freq: Every day | ORAL | 0 refills | Status: DC

## 2024-05-01 MED ORDER — METHYLPREDNISOLONE SODIUM SUCC 125 MG IJ SOLR
125.0000 mg | Freq: Once | INTRAMUSCULAR | Status: AC
  Administered 2024-05-01: 125 mg via INTRAVENOUS
  Filled 2024-05-01: qty 2

## 2024-05-01 MED ORDER — MAGNESIUM SULFATE 2 GM/50ML IV SOLN
2.0000 g | Freq: Once | INTRAVENOUS | Status: AC
  Administered 2024-05-01: 2 g via INTRAVENOUS
  Filled 2024-05-01: qty 50

## 2024-05-01 NOTE — ED Notes (Signed)
 Ambulated patient with pulse ox on room air. Consistently satting 96% on room air. MD notified.

## 2024-05-01 NOTE — ED Provider Notes (Signed)
 [place] EMERGENCY DEPARTMENT AT [facility] Provider Note   CSN: [id number] Arrival date & time: 05/01/24  1028     Patient presents with: Shortness of Breath   Antonio Baker is a 68 y.o. male.  He has a history of COPD, aortic stenosis hypertension and cholesterol.  He said he was here about 3 weeks ago for shortness of breath and treated for a COPD exacerbation.  Discharged on steroids.  Felt better for 3 to 4 days but then symptoms started recurring.  Has been bad for the last 3 to 4 days.  Breathing treatments do not seem to help.  Cough productive of clear sputum.  Chest pain with cough.  No fevers nausea vomiting.  Does have some nasal congestion.  Non-smoker.   The history is provided by the patient.  Shortness of Breath Severity:  Moderate Onset quality:  Gradual Duration:  6 days Timing:  Constant Progression:  Unchanged Chronicity:  Recurrent Relieved by:  Nothing Worsened by:  Activity and coughing Ineffective treatments:  Inhaler Associated symptoms: chest pain, cough, sputum production and wheezing   Associated symptoms: no abdominal pain, no fever, no hemoptysis and no vomiting   Risk factors: no tobacco use        Prior to Admission medications   Medication Sig Start Date End Date Taking? Authorizing Provider  albuterol  (PROVENTIL ) (5 MG/ML) 0.5% nebulizer solution Take 0.5 mLs (2.5 mg total) by nebulization every 6 (six) hours as needed for wheezing or shortness of breath. 05/07/22   Suzette Pac, MD  allopurinol  (ZYLOPRIM ) 300 MG tablet Take 300 mg by mouth daily.     [provider]  APPLE CIDER VINEGAR PO Take 45 mLs by mouth daily.     [provider]  carvedilol  (COREG ) 6.25 MG tablet Take 1 tablet (6.25 mg total) by mouth 2 (two) times daily with a meal. 07/15/23   Mallipeddi, Vishnu P, MD  Cyanocobalamin (B-12 PO) Take 2,500 mg by mouth daily.     [provider]  hydrochlorothiazide  (HYDRODIURIL ) 25 MG tablet Take  25 mg by mouth daily. 05/05/22   [provider]  nitroGLYCERIN  (NITROSTAT ) 0.4 MG SL tablet Place 1 tablet (0.4 mg total) under the tongue every 5 (five) minutes as needed for chest pain (CP or SOB). 08/17/19   Bhagat, Aleene, PA  rosuvastatin  (CRESTOR ) 20 MG tablet TAKE 1 TABLET BY MOUTH EVERY DAY AT 6 PM Patient taking differently: Take 20 mg by mouth daily. 10/08/21   Debera Jayson MATSU, MD  warfarin (COUMADIN) 5 MG tablet Take 5 mg by mouth daily. 03/26/24   [provider]    Allergies: Patient has no known allergies.    Review of Systems  Constitutional:  Negative for fever.  Respiratory:  Positive for cough, sputum production, shortness of breath and wheezing. Negative for hemoptysis.   Cardiovascular:  Positive for chest pain.  Gastrointestinal:  Negative for abdominal pain and vomiting.    Updated Vital Signs BP (!) 189/99 (BP Location: Left Arm)   Pulse 93   Temp 98.5 F (36.9 C) (Oral)   Resp 20   Ht 6' 2 (1.88 m)   Wt 104.3 kg   SpO2 94%   BMI 29.52 kg/m   Physical Exam Vitals and nursing note reviewed.  Constitutional:      Appearance: Normal appearance. He is well-developed.  HENT:     Head: Normocephalic and atraumatic.  Eyes:     Conjunctiva/sclera: Conjunctivae normal.  Cardiovascular:  Rate and Rhythm: Normal rate and regular rhythm.     Heart sounds: No murmur heard. Pulmonary:     Effort: Pulmonary effort is normal. No respiratory distress.     Breath sounds: Wheezing and rhonchi present.  Abdominal:     Palpations: Abdomen is soft.     Tenderness: There is no abdominal tenderness.  Musculoskeletal:     Cervical back: Neck supple.     Right lower leg: No tenderness. No edema.     Left lower leg: No tenderness. No edema.  Skin:    General: Skin is warm and dry.  Neurological:     General: No focal deficit present.     Mental Status: He is alert.     GCS: GCS eye subscore is 4. GCS verbal subscore is 5. GCS motor  subscore is 6.     (all labs ordered are listed, but only abnormal results are displayed) Labs Reviewed  BASIC METABOLIC PANEL WITH GFR - Abnormal; Notable for the following components:      Result Value   Glucose, Bld 103 (*)    All other components within normal limits  CBC WITH DIFFERENTIAL/PLATELET - Abnormal; Notable for the following components:   Eosinophils Absolute 0.7 (*)    All other components within normal limits  TROPONIN T, HIGH SENSITIVITY - Abnormal; Notable for the following components:   Troponin T High Sensitivity 23 (*)    All other components within normal limits  TROPONIN T, HIGH SENSITIVITY - Abnormal; Notable for the following components:   Troponin T High Sensitivity 21 (*)    All other components within normal limits  RESP PANEL BY RT-PCR (RSV, FLU A&B, COVID)  RVPGX2    EKG: EKG Interpretation Date/Time:  Tuesday May 01 2024 10:53:35 EST Ventricular Rate:  89 PR Interval:  157 QRS Duration:  91 QT Interval:  388 QTC Calculation: 473 R Axis:   67  Text Interpretation: Sinus rhythm No significant change since prior 11/25 Confirmed by Towana Sharper [phone]) on 05/01/2024 10:54:54 AM  Radiology: [facility] Chest Port 1 View Result Date: 05/01/2024 EXAM: 1 VIEW(S) XRAY OF THE CHEST 05/01/2024 11:02:00 AM COMPARISON: 04/11/2024 CLINICAL HISTORY: sob FINDINGS: LUNGS AND PLEURA: Chronic coarsened interstitial markings without pulmonary edema. No focal pulmonary opacity. No pleural effusion. No pneumothorax. HEART AND MEDIASTINUM: Atherosclerotic plaque. No acute abnormality of the cardiac and mediastinal silhouettes. BONES AND SOFT TISSUES: No acute osseous abnormality. IMPRESSION: 1. No acute cardiopulmonary process. 2. Chronic interstitial lung disease without pulmonary edema, which may account for dyspnea. Electronically signed by: Katheleen Faes MD 05/01/2024 11:26 AM EST RP Workstation: [id number]     Procedures   Medications Ordered in the ED   methylPREDNISolone  sodium succinate (SOLU-MEDROL ) 125 mg/2 mL injection 125 mg (has no administration in time range)  ipratropium-albuterol  (DUONEB) 0.5-2.5 (3) MG/3ML nebulizer solution 3 mL (has no administration in time range)  magnesium  sulfate IVPB 2 g 50 mL (has no administration in time range)    Clinical Course as of 05/01/24 1719  Tue May 01, 2024  1109 Chest x-ray interpreted by me as no clear infiltrate.  Awaiting radiology reading. [MB]  1259 trending pulse ox patient stayed in the mid 90s. [MB]    Clinical Course User Index [MB] Towana Sharper BROCKS, MD                                 Medical Decision Making Amount and/or Complexity  of Data Reviewed Labs: ordered. Radiology: ordered.  Risk Prescription drug management.   This patient complains of cough and shortness of breath; this involves an extensive number of treatment Options and is a complaint that carries with it a high risk of complications and morbidity. The differential includes pneumonia, COPD, CHF, infection, pneumothorax, PE  I ordered, reviewed and interpreted labs, which included CBC normal chemistries normal troponins mildly elevated but flat, COVID and flu negative I ordered medication IV steroids magnesium  breathing treatment and reviewed PMP when indicated. I ordered imaging studies which included chest x-ray and I independently    visualized and interpreted imaging which showed chronic findings Previous records obtained and reviewed in epic including recent ED visit Cardiac monitoring reviewed, sinus rhythm Social determinants considered, no significant barriers Critical Interventions: None  After the interventions stated above, I reevaluated the patient and found patient to be moving better air and resting comfortably Admission and further testing considered, he ambulated in the department without any desaturations.  He feels comfortable plan for discharge and outpatient follow-up.  Will put him  on steroid taper and antibiotics.  Return instructions discussed      Final diagnoses:  COPD exacerbation [facility])    ED Discharge Orders          Ordered    ipratropium (ATROVENT ) 0.02 % nebulizer solution  4 times daily        05/01/24 1324    predniSONE  (STERAPRED UNI-PAK 21 TAB) 10 MG (21) TBPK tablet  Daily        05/01/24 1324    doxycycline  (VIBRAMYCIN ) 100 MG capsule  2 times daily        05/01/24 1324               Towana Ozell BROCKS, MD 05/01/24 1721

## 2024-05-01 NOTE — ED Triage Notes (Signed)
 Pt c/o SOB x 6 days and states he is coughing up clear sputum  Pt c/o chest pain but states he believes it is from coughing; pt was recently here for same complaint  Pt states he took a breathing treatment before he came to the hospital

## 2024-05-01 NOTE — Discharge Instructions (Signed)
 We are putting you on steroid taper and antibiotics.  Continue your breathing treatments.  Follow-up with your primary care doctor.  Return to the emergency department if any worsening or concerning symptoms

## 2024-05-08 ENCOUNTER — Ambulatory Visit

## 2024-05-08 DIAGNOSIS — M205X1 Other deformities of toe(s) (acquired), right foot: Secondary | ICD-10-CM

## 2024-05-08 DIAGNOSIS — L97511 Non-pressure chronic ulcer of other part of right foot limited to breakdown of skin: Secondary | ICD-10-CM | POA: Diagnosis not present

## 2024-05-08 NOTE — Progress Notes (Signed)
 Subjective:  Patient ID: Antonio Baker, male    DOB: Feb 22, 1956,  MRN: [id number]  Chief Complaint  Patient presents with   Foot Ulcer    Rm12 follow up ulceration right foot/neuropathy    Discussed the use of AI scribe software for clinical note transcription with the patient, who gave verbal consent to proceed.  History of Present Illness Patient returns to clinic for repeat evaluation of his right first metatarsal ulceration.  He has been utilizing surgical shoe with provided pads.  He is unsure about the change in size of ulceration.  He denies any other pathology related to the foot.  He is dealing with an upper respiratory infection currently and is on abx for it.   Interval history 04/10/24: Antonio Baker is a 68 year old male with neuropathy who presents with a superficial ulceration on the plantar right first submetatarsal head.  The ulceration on the plantar aspect of the right foot, under the first submetatarsal head, has been present since June. It initially appeared to heal but recurred with bleeding noted on the sock this morning. Cream and bandages have been used, improving but not fully healing the ulcer. There is no pain upon touch and no signs of infection such as purulence.  Neuropathy affects his feet and hands, worsening in the morning. He is not diabetic. He uses a surgical shoe to manage pressure on the ulcerated area but does not wear it around the house, attempting to keep his foot straight.  He has previously seen Dr. Fernande for this and was referred to our office for second opinion.      Review of Systems: Negative except as noted in the HPI. Denies N/V/F/Ch.  Past Medical History:  Diagnosis Date   Asthma    Chronic pain    COPD (chronic obstructive pulmonary disease) (HCC)    Coronary atherosclerosis    Mild to moderate by cardiac catheterization March 2021   Degenerative disc disease, lumbar    Essential hypertension    Gout    History of DVT (deep vein  thrombosis)    History of pulmonary embolus (PE) 2016   Hyperlipidemia    Hypertension    Neuropathy    Peripheral neuropathic pain    Pulmonary embolism (HCC)     Current Outpatient Medications:    albuterol  (PROVENTIL ) (2.5 MG/3ML) 0.083% nebulizer solution, Take 2.5 mg by nebulization every 4 (four) hours as needed for shortness of breath., Disp: , Rfl:    albuterol  (PROVENTIL ) (5 MG/ML) 0.5% nebulizer solution, Take 0.5 mLs (2.5 mg total) by nebulization every 6 (six) hours as needed for wheezing or shortness of breath., Disp: 20 mL, Rfl: 12   allopurinol  (ZYLOPRIM ) 300 MG tablet, Take 300 mg by mouth daily. , Disp: , Rfl:    carvedilol  (COREG ) 6.25 MG tablet, Take 1 tablet (6.25 mg total) by mouth 2 (two) times daily with a meal., Disp: 180 tablet, Rfl: 3   Cholecalciferol (VITAMIN D3) 25 MCG CAPS, Take 1 capsule by mouth daily., Disp: , Rfl:    Cyanocobalamin (B-12 PO), Take 2,500 mg by mouth daily. , Disp: , Rfl:    doxycycline  (VIBRAMYCIN ) 100 MG capsule, Take 1 capsule (100 mg total) by mouth 2 (two) times daily., Disp: 20 capsule, Rfl: 0   hydrochlorothiazide  (HYDRODIURIL ) 25 MG tablet, Take 25 mg by mouth daily., Disp: , Rfl:    HYDROcodone -acetaminophen  (NORCO) 10-325 MG tablet, Take 1 tablet by mouth See admin instructions. Take 1 to 2 times daily,  Disp: , Rfl:    ipratropium (ATROVENT ) 0.02 % nebulizer solution, Take 2.5 mLs (0.5 mg total) by nebulization 4 (four) times daily., Disp: 75 mL, Rfl: 12   nitroGLYCERIN  (NITROSTAT ) 0.4 MG SL tablet, Place 1 tablet (0.4 mg total) under the tongue every 5 (five) minutes as needed for chest pain (CP or SOB)., Disp: 25 tablet, Rfl: 12   predniSONE  (STERAPRED UNI-PAK 21 TAB) 10 MG (21) TBPK tablet, Take by mouth daily. Take 6 tabs by mouth daily  for 2 days, then 5 tabs for 2 days, then 4 tabs for 2 days, then 3 tabs for 2 days, 2 tabs for 2 days, then 1 tab by mouth daily for 2 days, Disp: 42 tablet, Rfl: 0   rosuvastatin  (CRESTOR ) 20 MG  tablet, TAKE 1 TABLET BY MOUTH EVERY DAY AT 6 PM (Patient taking differently: Take 20 mg by mouth daily.), Disp: 7 tablet, Rfl: 0   warfarin (COUMADIN) 3 MG tablet, Take 3 mg by mouth daily., Disp: , Rfl:    warfarin (COUMADIN) 5 MG tablet, Take 5 mg by mouth See admin instructions., Disp: , Rfl:   Social History   Tobacco Use  Smoking Status Former   Current packs/day: 0.00   Average packs/day: 2.0 packs/day for 20.0 years (40.0 ttl pk-yrs)   Types: Cigarettes   Start date: 05/16/1979   Quit date: 05/16/1999   Years since quitting: 24.9  Smokeless Tobacco Never    No Known Allergies Objective:    Physical Exam EXTREMITIES: Superficial ulceration at plantar right first submetatarsal head. Improvement in size, currently 0.4cm x 0.2 cm x superficial. Partial thickness, does not extend beyond hypodermis. Surrounding hyperkeratotic tissue. No pain. No erythema, edema, or signs of infection. Minimal sesamoid prominence underlying. Shoe shows wear indicating pressure to plantar first MTP in surgical shoe. Faintly palpable pulses.    Assessment:   1. Ulcerated, foot, right, limited to breakdown of skin (HCC)   2. Hallux limitus, right       Plan:  Patient was evaluated and treated and all questions answered.  Assessment and Plan Assessment & Plan Chronic superficial ulceration of plantar right first submetatarsal head Chronic ulceration with significant pressure at plantar first MTP joint. Neuropathy present, no infection or diabetes. Non-surgical interventions preferred. - Continue ointment and Band-Aid application. - He did utilize offloading pads and had improvement in ulceration size. He did not call his insurance about orthotics coverage. He was provided the code to ask about. He will schedule with us  or Dr. Fernande for orthotic fitting. - Patient to check insurance coverage for orthotics. - Plan orthotics fitting. Would require custom offloading orthotics for his 1st MTP.  Cutout underneath 1st MTP.  He may get these from Dr. Fernande if he offers them. - Wound and surrounding hyperkeratotic tissue debrided today without incident. Bandaid with triple abx ointment applied.  - Consider surgical options if orthotics not feasible. Would likely include 1st MTP arthrodesis with decompression of sesamoids.    RTC PRN  Prentice Ovens, DPM AACFAS Fellowship Trained Podiatric Surgeon [facility]

## 2024-05-27 ENCOUNTER — Emergency Department (HOSPITAL_COMMUNITY)

## 2024-05-27 ENCOUNTER — Encounter (HOSPITAL_COMMUNITY): Payer: Self-pay | Admitting: Emergency Medicine

## 2024-05-27 ENCOUNTER — Emergency Department (HOSPITAL_COMMUNITY)
Admission: EM | Admit: 2024-05-27 | Discharge: 2024-05-27 | Disposition: A | Discharge status: Home / Self Care | Attending: Emergency Medicine | Admitting: Emergency Medicine

## 2024-05-27 ENCOUNTER — Other Ambulatory Visit: Payer: Self-pay

## 2024-05-27 DIAGNOSIS — J441 Chronic obstructive pulmonary disease with (acute) exacerbation: Secondary | ICD-10-CM | POA: Diagnosis not present

## 2024-05-27 DIAGNOSIS — R600 Localized edema: Secondary | ICD-10-CM | POA: Insufficient documentation

## 2024-05-27 DIAGNOSIS — I10 Essential (primary) hypertension: Secondary | ICD-10-CM | POA: Diagnosis not present

## 2024-05-27 DIAGNOSIS — Z7952 Long term (current) use of systemic steroids: Secondary | ICD-10-CM | POA: Insufficient documentation

## 2024-05-27 DIAGNOSIS — Z87891 Personal history of nicotine dependence: Secondary | ICD-10-CM | POA: Diagnosis not present

## 2024-05-27 DIAGNOSIS — Z7951 Long term (current) use of inhaled steroids: Secondary | ICD-10-CM | POA: Insufficient documentation

## 2024-05-27 DIAGNOSIS — Z7901 Long term (current) use of anticoagulants: Secondary | ICD-10-CM | POA: Insufficient documentation

## 2024-05-27 DIAGNOSIS — R059 Cough, unspecified: Secondary | ICD-10-CM | POA: Diagnosis present

## 2024-05-27 DIAGNOSIS — Z79899 Other long term (current) drug therapy: Secondary | ICD-10-CM | POA: Diagnosis not present

## 2024-05-27 LAB — CBC WITH DIFFERENTIAL/PLATELET
Abs Immature Granulocytes: 0.08 K/uL — ABNORMAL HIGH (ref 0.00–0.07)
Basophils Absolute: 0.1 K/uL (ref 0.0–0.1)
Basophils Relative: 1 %
Eosinophils Absolute: 0.7 K/uL — ABNORMAL HIGH (ref 0.0–0.5)
Eosinophils Relative: 6 %
HCT: 46.4 % (ref 39.0–52.0)
Hemoglobin: 16.1 g/dL (ref 13.0–17.0)
Immature Granulocytes: 1 %
Lymphocytes Relative: 21 %
Lymphs Abs: 2.5 K/uL (ref 0.7–4.0)
MCH: 32.2 pg (ref 26.0–34.0)
MCHC: 34.7 g/dL (ref 30.0–36.0)
MCV: 92.8 fL (ref 80.0–100.0)
Monocytes Absolute: 1.3 K/uL — ABNORMAL HIGH (ref 0.1–1.0)
Monocytes Relative: 10 %
Neutro Abs: 7.7 K/uL (ref 1.7–7.7)
Neutrophils Relative %: 61 %
Platelets: 215 K/uL (ref 150–400)
RBC: 5 MIL/uL (ref 4.22–5.81)
RDW: 13 % (ref 11.5–15.5)
WBC: 12.4 K/uL — ABNORMAL HIGH (ref 4.0–10.5)
nRBC: 0 % (ref 0.0–0.2)

## 2024-05-27 LAB — BLOOD GAS, VENOUS
Acid-Base Excess: 2.5 mmol/L — ABNORMAL HIGH (ref 0.0–2.0)
Bicarbonate: 26.3 mmol/L (ref 20.0–28.0)
Drawn by: 42942
O2 Saturation: 90.4 %
Patient temperature: 36.7
pCO2, Ven: 36 mmHg — ABNORMAL LOW (ref 44–60)
pH, Ven: 7.47 — ABNORMAL HIGH (ref 7.25–7.43)
pO2, Ven: 58 mmHg — ABNORMAL HIGH (ref 32–45)

## 2024-05-27 LAB — TROPONIN T, HIGH SENSITIVITY
Troponin T High Sensitivity: 21 ng/L — ABNORMAL HIGH (ref 0–19)
Troponin T High Sensitivity: 20 ng/L — ABNORMAL HIGH (ref 0–19)

## 2024-05-27 LAB — COMPREHENSIVE METABOLIC PANEL WITH GFR
ALT: 24 U/L (ref 0–44)
AST: 29 U/L (ref 15–41)
Albumin: 4.4 g/dL (ref 3.5–5.0)
Alkaline Phosphatase: 99 U/L (ref 38–126)
Anion gap: 10 (ref 5–15)
BUN: 9 mg/dL (ref 8–23)
CO2: 28 mmol/L (ref 22–32)
Calcium: 9.5 mg/dL (ref 8.9–10.3)
Chloride: 99 mmol/L (ref 98–111)
Creatinine, Ser: 0.59 mg/dL — ABNORMAL LOW (ref 0.61–1.24)
GFR, Estimated: >60 mL/min
Glucose, Bld: 89 mg/dL (ref 70–99)
Potassium: 4 mmol/L (ref 3.5–5.1)
Sodium: 138 mmol/L (ref 135–145)
Total Bilirubin: 0.5 mg/dL (ref 0.0–1.2)
Total Protein: 7.8 g/dL (ref 6.5–8.1)

## 2024-05-27 LAB — RESP PANEL BY RT-PCR (RSV, FLU A&B, COVID)  RVPGX2
Influenza A by PCR: NEGATIVE
Influenza B by PCR: NEGATIVE
Resp Syncytial Virus by PCR: NEGATIVE
SARS Coronavirus 2 by RT PCR: NEGATIVE

## 2024-05-27 LAB — PROTIME-INR
INR: 3.2 — ABNORMAL HIGH (ref 0.8–1.2)
Prothrombin Time: 34.3 s — ABNORMAL HIGH (ref 11.4–15.2)

## 2024-05-27 LAB — PRO BRAIN NATRIURETIC PEPTIDE: Pro Brain Natriuretic Peptide: 167 pg/mL

## 2024-05-27 MED ORDER — DOXYCYCLINE HYCLATE 100 MG PO CAPS: 100.0000 mg | ORAL_CAPSULE | Freq: Two times a day (BID) | ORAL | 0 refills | Status: AC

## 2024-05-27 MED ORDER — PREDNISONE 50 MG PO TABS: 50.0000 mg | ORAL_TABLET | Freq: Every day | ORAL | 0 refills | Status: AC

## 2024-05-27 MED ORDER — AMOXICILLIN-POT CLAVULANATE 875-125 MG PO TABS: 1.0000 | ORAL_TABLET | Freq: Two times a day (BID) | ORAL | 0 refills | Status: AC

## 2024-05-27 MED ORDER — IPRATROPIUM-ALBUTEROL 0.5-2.5 (3) MG/3ML IN SOLN
3.0000 mL | Freq: Once | RESPIRATORY_TRACT | Status: AC
  Administered 2024-05-27: 3 mL via RESPIRATORY_TRACT
  Filled 2024-05-27: qty 3

## 2024-05-27 MED ORDER — METHYLPREDNISOLONE SODIUM SUCC 125 MG IJ SOLR
125.0000 mg | Freq: Once | INTRAMUSCULAR | Status: AC
  Administered 2024-05-27: 125 mg via INTRAVENOUS
  Filled 2024-05-27: qty 2

## 2024-05-27 MED ORDER — AMOXICILLIN-POT CLAVULANATE 875-125 MG PO TABS
1.0000 | ORAL_TABLET | Freq: Once | ORAL | Status: AC
  Administered 2024-05-27: 1 via ORAL
  Filled 2024-05-27: qty 1

## 2024-05-27 MED ORDER — DOXYCYCLINE HYCLATE 100 MG PO TABS
100.0000 mg | ORAL_TABLET | Freq: Once | ORAL | Status: AC
  Administered 2024-05-27: 100 mg via ORAL
  Filled 2024-05-27: qty 1

## 2024-05-27 NOTE — ED Provider Notes (Signed)
 " [place] EMERGENCY DEPARTMENT AT [facility] Provider Note  CSN: [id number] Arrival date & time: 05/27/24 1505  Chief Complaint(s) Shortness of Breath  HPI Antonio Baker is a 69 y.o. male history of COPD, hypertension, hyperlipidemia, prior pulm embolism on warfarin, presented to the emergency department shortness of breath.  Patient reports shortness of breath which has been present for around 3 months which comes and goes.  Has been to the emergency department for this previously.  Reports that has been worse over the past few days.  Has had nonproductive cough, wheezing.  Taking nebulizer at home with some improvement.  Reports some chest pain with cough, no pleuritic pain or chest pain or pressure outside cough.  No fevers or chills.  Has intermittent leg swelling which is present.  Shortness of breath is worse when lying flat or with exertion.  Reports compliance with his home medicine.   Past Medical History Past Medical History:  Diagnosis Date   Asthma    Chronic pain    COPD (chronic obstructive pulmonary disease) (HCC)    Coronary atherosclerosis    Mild to moderate by cardiac catheterization March 2021   Degenerative disc disease, lumbar    Essential hypertension    Gout    History of DVT (deep vein thrombosis)    History of pulmonary embolus (PE) 2016   Hyperlipidemia    Hypertension    Neuropathy    Peripheral neuropathic pain    Pulmonary embolism [facility])    Patient Active Problem List   Diagnosis Date Noted   Mild aortic stenosis 07/15/2023   Non-cardiac chest pain 07/15/2023   History of pulmonary embolism 07/15/2023   NSTEMI (non-ST elevated myocardial infarction) (HCC) 08/16/2019   Elevated troponin    Left arm pain    Hypercalcemia 02/09/2015   Left leg DVT (HCC) 02/08/2015   Acute pulmonary embolism (HCC) 02/08/2015   Dehydration 02/08/2015   Leukocytosis 02/08/2015   COPD (chronic obstructive pulmonary disease) (HCC) 02/08/2015   AKI (acute  kidney injury) 02/07/2015   Tobacco dependence    Acute respiratory failure with hypoxia (HCC) 01/25/2015   High cholesterol    HCAP (healthcare-associated pneumonia) 06/11/2012   COPD exacerbation (HCC) 06/10/2012   GERD (gastroesophageal reflux disease) 06/10/2012   Hyperglycemia, drug-induced 05/16/2012   Tachycardia 05/16/2012   Asthma with exacerbation 05/15/2012   CAP (community acquired pneumonia) 05/15/2012   Hypertension 05/15/2012   Home Medication(s) Prior to Admission medications  Medication Sig Start Date End Date Taking? Authorizing Provider  amoxicillin -clavulanate (AUGMENTIN ) 875-125 MG tablet Take 1 tablet by mouth every 12 (twelve) hours. 05/27/24  Yes Francesca Elsie CROME, MD  doxycycline  (VIBRAMYCIN ) 100 MG capsule Take 1 capsule (100 mg total) by mouth 2 (two) times daily for 7 days. 05/27/24 06/03/24 Yes Francesca Elsie CROME, MD  predniSONE  (DELTASONE ) 50 MG tablet Take 1 tablet (50 mg total) by mouth daily for 5 days. 05/27/24 06/01/24 Yes Francesca Elsie CROME, MD  albuterol  (PROVENTIL ) (2.5 MG/3ML) 0.083% nebulizer solution Take 2.5 mg by nebulization every 4 (four) hours as needed for shortness of breath. 04/24/24   [provider]  albuterol  (PROVENTIL ) (5 MG/ML) 0.5% nebulizer solution Take 0.5 mLs (2.5 mg total) by nebulization every 6 (six) hours as needed for wheezing or shortness of breath. 05/07/22   Suzette Pac, MD  allopurinol  (ZYLOPRIM ) 300 MG tablet Take 300 mg by mouth daily.     [provider]  carvedilol  (COREG ) 6.25 MG tablet Take 1 tablet (6.25 mg  total) by mouth 2 (two) times daily with a meal. 07/15/23   Mallipeddi, Vishnu P, MD  Cholecalciferol (VITAMIN D3) 25 MCG CAPS Take 1 capsule by mouth daily.    [provider]  Cyanocobalamin (B-12 PO) Take 2,500 mg by mouth daily.     [provider]  hydrochlorothiazide  (HYDRODIURIL ) 25 MG tablet Take 25 mg by mouth daily. 05/05/22   [provider]   HYDROcodone -acetaminophen  (NORCO) 10-325 MG tablet Take 1 tablet by mouth See admin instructions. Take 1 to 2 times daily    [provider]  ipratropium (ATROVENT ) 0.02 % nebulizer solution Take 2.5 mLs (0.5 mg total) by nebulization 4 (four) times daily. 05/01/24   Towana Ozell BROCKS, MD  nitroGLYCERIN  (NITROSTAT ) 0.4 MG SL tablet Place 1 tablet (0.4 mg total) under the tongue every 5 (five) minutes as needed for chest pain (CP or SOB). 08/17/19   Bhagat, Aleene, PA  rosuvastatin  (CRESTOR ) 20 MG tablet TAKE 1 TABLET BY MOUTH EVERY DAY AT 6 PM Patient taking differently: Take 20 mg by mouth daily. 10/08/21   Debera Jayson MATSU, MD  warfarin (COUMADIN) 3 MG tablet Take 3 mg by mouth daily. 04/27/24   [provider]  warfarin (COUMADIN) 5 MG tablet Take 5 mg by mouth See admin instructions. 03/26/24   [provider]                                                                                                                                    Past Surgical History Past Surgical History:  Procedure Laterality Date   CORONARY ULTRASOUND/IVUS N/A 08/16/2019   Procedure: Intravascular Ultrasound/IVUS;  Surgeon: Dann Candyce RAMAN, MD;  Location: [facility] INVASIVE CV LAB;  Service: Cardiovascular;  Laterality: N/A;   ESOPHAGOGASTRODUODENOSCOPY  06/12/2012   DOQ:Dfjoo hiatal hernia/WHITE PLAQUES MOST LIKELY DUE TO CANDIDA ESOPHAGITIS/Single ulcer at the gastroesophageal junction-MOST LIKELY CAUSE FOR ODYNOPHAGIA/Stricture  at the gastroesophageal junction   HERNIA REPAIR     KNEE ARTHROSCOPY     LEFT HEART CATH AND CORONARY ANGIOGRAPHY N/A 08/16/2019   Procedure: LEFT HEART CATH AND CORONARY ANGIOGRAPHY;  Surgeon: Dann Candyce RAMAN, MD;  Location: [facility] INVASIVE CV LAB;  Service: Cardiovascular;  Laterality: N/A;   Right knee arthroscopy     Family History Family History  Problem Relation Age of Onset   Cancer Mother        breast   Stroke Father    Hypertension Father     Kidney disease Father     Social History Social History[1] Allergies Patient has no known allergies.  Review of Systems Review of Systems  All other systems reviewed and are negative.   Physical Exam Vital Signs  I have reviewed the triage vital signs BP (!) 172/99   Pulse 98   Temp 98 F (36.7 C) (Oral)   Resp 18   Ht 6' 2 (1.88 m)   Wt 104.3 kg  SpO2 94%   BMI 29.52 kg/m  Physical Exam Vitals and nursing note reviewed.  Constitutional:      General: He is not in acute distress.    Appearance: Normal appearance.  HENT:     Mouth/Throat:     Mouth: Mucous membranes are moist.  Eyes:     Conjunctiva/sclera: Conjunctivae normal.  Cardiovascular:     Rate and Rhythm: Normal rate and regular rhythm.  Pulmonary:     Effort: Tachypnea, accessory muscle usage and respiratory distress present.     Breath sounds: Examination of the right-upper field reveals wheezing. Examination of the left-upper field reveals wheezing. Examination of the right-middle field reveals wheezing. Examination of the left-middle field reveals wheezing. Examination of the right-lower field reveals wheezing. Examination of the left-lower field reveals wheezing. Wheezing present. No rales.  Abdominal:     General: Abdomen is flat.     Palpations: Abdomen is soft.     Tenderness: There is no abdominal tenderness.  Musculoskeletal:     Right lower leg: Edema present.     Left lower leg: Edema present.  Skin:    General: Skin is warm and dry.     Capillary Refill: Capillary refill takes less than 2 seconds.  Neurological:     Mental Status: He is alert and oriented to person, place, and time. Mental status is at baseline.  Psychiatric:        Mood and Affect: Mood normal.        Behavior: Behavior normal.     ED Results and Treatments Labs (all labs ordered are listed, but only abnormal results are displayed) Labs Reviewed  COMPREHENSIVE METABOLIC PANEL WITH GFR - Abnormal; Notable for  the following components:      Result Value   Creatinine, Ser 0.59 (*)    All other components within normal limits  CBC WITH DIFFERENTIAL/PLATELET - Abnormal; Notable for the following components:   WBC 12.4 (*)    Monocytes Absolute 1.3 (*)    Eosinophils Absolute 0.7 (*)    Abs Immature Granulocytes 0.08 (*)    All other components within normal limits  BLOOD GAS, VENOUS - Abnormal; Notable for the following components:   pH, Ven 7.47 (*)    pCO2, Ven 36 (*)    pO2, Ven 58 (*)    Acid-Base Excess 2.5 (*)    All other components within normal limits  PROTIME-INR - Abnormal; Notable for the following components:   Prothrombin Time 34.3 (*)    INR 3.2 (*)    All other components within normal limits  TROPONIN T, HIGH SENSITIVITY - Abnormal; Notable for the following components:   Troponin T High Sensitivity 21 (*)    All other components within normal limits  TROPONIN T, HIGH SENSITIVITY - Abnormal; Notable for the following components:   Troponin T High Sensitivity 20 (*)    All other components within normal limits  RESP PANEL BY RT-PCR (RSV, FLU A&B, COVID)  RVPGX2  PRO BRAIN NATRIURETIC PEPTIDE  Radiology DG Chest Portable 1 View Result Date: 05/27/2024 CLINICAL DATA:  Shortness of breath EXAM: PORTABLE CHEST 1 VIEW COMPARISON:  05/01/2024, 08/08/2023, 05/07/2022 FINDINGS: Chronic interstitial opacities. No focal consolidation or pleural effusionstable cardiomediastinal silhouette with aortic atherosclerosis. Question asymmetrical right apical opacity. IMPRESSION: 1. Question asymmetrical right apical opacity, suggest short-term two-view chest x-ray follow-up 2. Otherwise stable chronic interstitial opacities Electronically Signed   By: Luke Bun M.D.   On: 05/27/2024 15:52    Pertinent labs & imaging results that were available during my care of the  patient were reviewed by me and considered in my medical decision making (see MDM for details).  Medications Ordered in ED Medications  ipratropium-albuterol  (DUONEB) 0.5-2.5 (3) MG/3ML nebulizer solution 3 mL (3 mLs Nebulization Given 05/27/24 1530)  methylPREDNISolone  sodium succinate (SOLU-MEDROL ) 125 mg/2 mL injection 125 mg (125 mg Intravenous Given 05/27/24 1534)  ipratropium-albuterol  (DUONEB) 0.5-2.5 (3) MG/3ML nebulizer solution 3 mL (3 mLs Nebulization Given 05/27/24 1729)  amoxicillin -clavulanate (AUGMENTIN ) 875-125 MG per tablet 1 tablet (1 tablet Oral Given 05/27/24 1846)  doxycycline  (VIBRA -TABS) tablet 100 mg (100 mg Oral Given 05/27/24 1846)                                                                                                                                     Procedures Procedures  (including critical care time)  Medical Decision Making / ED Course   MDM:  69 year old presenting with shortness of breath.  Seems most consistent with COPD exacerbation given clinical examination.  Does have some lower extremity swelling so we will check BNP, chest x-ray for signs of pulmonary edema.  Considered other process such as pulmonary embolism, has had history of this but is on chronic warfarin therapy, if patient is therapeutic would not pursue further imaging given chronic anticoagulation on warfarin.  Endorses some chest pain with cough but seems more musculoskeletal in the setting of cough and shortness of breath, will check troponin, EKG shows sinus tachycardia.  Will reassess.  Clinical Course as of 05/27/24 8147  Sun May 27, 2024  1851 Patient feeling much better.  Chest x-ray with possible pneumonia or infiltrate, discussed with patient including need for repeat chest x-ray to ensure resolution.  Not hypoxic.  Work of breathing has improved significantly.  Minimal elevation in troponin which is stable on recheck. Will discharge patient to home. All questions answered. Patient  comfortable with plan of discharge. Return precautions discussed with patient and specified on the after visit summary.  [WS]    Clinical Course User Index [WS] Francesca Elsie CROME, MD     Additional history obtained:  -External records from outside source obtained and reviewed including: Chart review including previous notes, labs, imaging, consultation notes including prior visit for similar   Lab Tests: -I ordered, reviewed, and interpreted labs.   The pertinent results include:   Labs Reviewed  COMPREHENSIVE METABOLIC PANEL WITH GFR - Abnormal; Notable  for the following components:      Result Value   Creatinine, Ser 0.59 (*)    All other components within normal limits  CBC WITH DIFFERENTIAL/PLATELET - Abnormal; Notable for the following components:   WBC 12.4 (*)    Monocytes Absolute 1.3 (*)    Eosinophils Absolute 0.7 (*)    Abs Immature Granulocytes 0.08 (*)    All other components within normal limits  BLOOD GAS, VENOUS - Abnormal; Notable for the following components:   pH, Ven 7.47 (*)    pCO2, Ven 36 (*)    pO2, Ven 58 (*)    Acid-Base Excess 2.5 (*)    All other components within normal limits  PROTIME-INR - Abnormal; Notable for the following components:   Prothrombin Time 34.3 (*)    INR 3.2 (*)    All other components within normal limits  TROPONIN T, HIGH SENSITIVITY - Abnormal; Notable for the following components:   Troponin T High Sensitivity 21 (*)    All other components within normal limits  TROPONIN T, HIGH SENSITIVITY - Abnormal; Notable for the following components:   Troponin T High Sensitivity 20 (*)    All other components within normal limits  RESP PANEL BY RT-PCR (RSV, FLU A&B, COVID)  RVPGX2  PRO BRAIN NATRIURETIC PEPTIDE    Notable for mild stable troponin eelvation likely demand related   EKG   EKG Interpretation Date/Time:  Sunday May 27 2024 15:21:05 EST Ventricular Rate:  107 PR Interval:  158 QRS Duration:  84 QT  Interval:  343 QTC Calculation: 458 R Axis:   73  Text Interpretation: Sinus tachycardia Confirmed by Francesca Fallow ([id number]) on 05/27/2024 3:28:19 PM         Imaging Studies ordered: I ordered imaging studies including CXR On my interpretation imaging demonstrates possible pna I independently visualized and interpreted imaging. I agree with the radiologist interpretation   Medicines ordered and prescription drug management: Meds ordered this encounter  Medications   ipratropium-albuterol  (DUONEB) 0.5-2.5 (3) MG/3ML nebulizer solution 3 mL   methylPREDNISolone  sodium succinate (SOLU-MEDROL ) 125 mg/2 mL injection 125 mg   ipratropium-albuterol  (DUONEB) 0.5-2.5 (3) MG/3ML nebulizer solution 3 mL   amoxicillin -clavulanate (AUGMENTIN ) 875-125 MG per tablet 1 tablet   doxycycline  (VIBRA -TABS) tablet 100 mg   amoxicillin -clavulanate (AUGMENTIN ) 875-125 MG tablet    Sig: Take 1 tablet by mouth every 12 (twelve) hours.    Dispense:  14 tablet    Refill:  0   doxycycline  (VIBRAMYCIN ) 100 MG capsule    Sig: Take 1 capsule (100 mg total) by mouth 2 (two) times daily for 7 days.    Dispense:  14 capsule    Refill:  0   predniSONE  (DELTASONE ) 50 MG tablet    Sig: Take 1 tablet (50 mg total) by mouth daily for 5 days.    Dispense:  5 tablet    Refill:  0    -I have reviewed the patients home medicines and have made adjustments as needed   Consultations Obtained: I requested consultation with the CXR,  and discussed lab and imaging findings as well as pertinent plan - they recommend: possible pneumonia    Cardiac Monitoring: The patient was maintained on a cardiac monitor.  I personally viewed and interpreted the cardiac monitored which showed an underlying rhythm of: ST  Social Determinants of Health:  Diagnosis or treatment significantly limited by social determinants of health: former smoker   Reevaluation: After the interventions noted above, I reevaluated the  patient and  found that their symptoms have improved  Co morbidities that complicate the patient evaluation  Past Medical History:  Diagnosis Date   Asthma    Chronic pain    COPD (chronic obstructive pulmonary disease) (HCC)    Coronary atherosclerosis    Mild to moderate by cardiac catheterization March 2021   Degenerative disc disease, lumbar    Essential hypertension    Gout    History of DVT (deep vein thrombosis)    History of pulmonary embolus (PE) 2016   Hyperlipidemia    Hypertension    Neuropathy    Peripheral neuropathic pain    Pulmonary embolism (HCC)       Dispostion: Disposition decision including need for hospitalization was considered, and patient discharged from emergency department.    Final Clinical Impression(s) / ED Diagnoses Final diagnoses:  COPD exacerbation (HCC)     This chart was dictated using voice recognition software.  Despite best efforts to proofread,  errors can occur which can change the documentation meaning.     [1]  Social History Tobacco Use   Smoking status: Former    Current packs/day: 0.00    Average packs/day: 2.0 packs/day for 20.0 years (40.0 ttl pk-yrs)    Types: Cigarettes    Start date: 05/16/1979    Quit date: 05/16/1999    Years since quitting: 25.0   Smokeless tobacco: Never  Vaping Use   Vaping status: Never Used  Substance Use Topics   Alcohol use: Yes    Comment: occasional, 4+ a week   Drug use: No     Francesca Elsie CROME, MD 05/27/24 [phone]  "

## 2024-05-27 NOTE — Discharge Instructions (Addendum)
 We evaluated you for your shortness of breath.  Your testing shows signs of COPD.  You may have a pneumonia and we have started you on antibiotics and prednisone .  Please take these as prescribed and follow-up closely with your primary doctor.  The radiologist recommends that you have a repeat chest x-ray to make sure that the pneumonia has resolved.  Please return if you have any worsening symptoms

## 2024-05-27 NOTE — ED Triage Notes (Signed)
 Pt c/o sob for 3 months that has gotten worse today.

## 2024-07-12 ENCOUNTER — Ambulatory Visit: Admitting: Student
